# Patient Record
Sex: Female | Born: 1960 | Race: Black or African American | Hispanic: No | Marital: Married | Smoking: Never smoker
Health system: Southern US, Community
[De-identification: ages and names within clinical notes are randomized; demographics above are authoritative.]

## PROBLEM LIST (undated history)

## (undated) DIAGNOSIS — C50012 Malignant neoplasm of nipple and areola, left female breast: Secondary | ICD-10-CM

## (undated) DIAGNOSIS — Z923 Personal history of irradiation: Secondary | ICD-10-CM

## (undated) DIAGNOSIS — C50919 Malignant neoplasm of unspecified site of unspecified female breast: Secondary | ICD-10-CM

## (undated) DIAGNOSIS — Z9221 Personal history of antineoplastic chemotherapy: Secondary | ICD-10-CM

## (undated) HISTORY — DX: Malignant neoplasm of nipple and areola, left female breast: C50.012

---

## 1998-11-11 ENCOUNTER — Inpatient Hospital Stay (HOSPITAL_COMMUNITY): Admission: AD | Admit: 1998-11-11 | Discharge: 1998-11-11 | Payer: Self-pay | Admitting: Obstetrics & Gynecology

## 1998-12-12 ENCOUNTER — Inpatient Hospital Stay (HOSPITAL_COMMUNITY): Admission: AD | Admit: 1998-12-12 | Discharge: 1998-12-12 | Payer: Self-pay | Admitting: Obstetrics and Gynecology

## 1999-01-11 ENCOUNTER — Inpatient Hospital Stay (HOSPITAL_COMMUNITY): Admission: AD | Admit: 1999-01-11 | Discharge: 1999-01-11 | Payer: Self-pay | Admitting: Obstetrics & Gynecology

## 1999-02-10 ENCOUNTER — Inpatient Hospital Stay (HOSPITAL_COMMUNITY): Admission: AD | Admit: 1999-02-10 | Discharge: 1999-02-10 | Payer: Self-pay | Admitting: Obstetrics & Gynecology

## 1999-03-14 ENCOUNTER — Inpatient Hospital Stay (HOSPITAL_COMMUNITY): Admission: AD | Admit: 1999-03-14 | Discharge: 1999-03-14 | Payer: Self-pay | Admitting: Obstetrics & Gynecology

## 1999-03-17 ENCOUNTER — Inpatient Hospital Stay (HOSPITAL_COMMUNITY): Admission: AD | Admit: 1999-03-17 | Discharge: 1999-03-17 | Payer: Self-pay | Admitting: Obstetrics & Gynecology

## 1999-03-22 ENCOUNTER — Inpatient Hospital Stay (HOSPITAL_COMMUNITY): Admission: AD | Admit: 1999-03-22 | Discharge: 1999-03-22 | Payer: Self-pay | Admitting: Obstetrics & Gynecology

## 1999-03-24 ENCOUNTER — Ambulatory Visit (HOSPITAL_COMMUNITY): Admission: RE | Admit: 1999-03-24 | Discharge: 1999-03-24 | Payer: Self-pay | Admitting: Obstetrics and Gynecology

## 1999-04-13 ENCOUNTER — Ambulatory Visit (HOSPITAL_COMMUNITY): Admission: RE | Admit: 1999-04-13 | Discharge: 1999-04-13 | Payer: Self-pay | Admitting: Obstetrics and Gynecology

## 1999-04-13 ENCOUNTER — Encounter: Payer: Self-pay | Admitting: Obstetrics and Gynecology

## 1999-04-28 ENCOUNTER — Ambulatory Visit (HOSPITAL_COMMUNITY): Admission: RE | Admit: 1999-04-28 | Discharge: 1999-04-28 | Payer: Self-pay | Admitting: Obstetrics and Gynecology

## 1999-04-28 ENCOUNTER — Encounter: Payer: Self-pay | Admitting: Obstetrics and Gynecology

## 1999-05-18 ENCOUNTER — Ambulatory Visit (HOSPITAL_COMMUNITY): Admission: RE | Admit: 1999-05-18 | Discharge: 1999-05-18 | Payer: Self-pay | Admitting: Gastroenterology

## 1999-05-29 ENCOUNTER — Ambulatory Visit (HOSPITAL_COMMUNITY): Admission: RE | Admit: 1999-05-29 | Discharge: 1999-05-29 | Payer: Self-pay | Admitting: Gastroenterology

## 1999-05-29 ENCOUNTER — Encounter: Payer: Self-pay | Admitting: Gastroenterology

## 1999-06-07 ENCOUNTER — Inpatient Hospital Stay (HOSPITAL_COMMUNITY): Admission: AD | Admit: 1999-06-07 | Discharge: 1999-06-09 | Payer: Self-pay | Admitting: Obstetrics & Gynecology

## 1999-06-08 ENCOUNTER — Encounter: Payer: Self-pay | Admitting: Obstetrics & Gynecology

## 1999-08-02 ENCOUNTER — Inpatient Hospital Stay (HOSPITAL_COMMUNITY): Admission: RE | Admit: 1999-08-02 | Discharge: 1999-08-05 | Payer: Self-pay | Admitting: Obstetrics and Gynecology

## 2019-06-19 HISTORY — PX: BREAST LUMPECTOMY: SHX2

## 2019-07-03 ENCOUNTER — Other Ambulatory Visit: Payer: Self-pay | Admitting: Surgery

## 2019-07-03 ENCOUNTER — Other Ambulatory Visit: Payer: Self-pay | Admitting: Specialist

## 2019-07-03 DIAGNOSIS — R9389 Abnormal findings on diagnostic imaging of other specified body structures: Secondary | ICD-10-CM

## 2019-07-21 ENCOUNTER — Ambulatory Visit
Admission: RE | Admit: 2019-07-21 | Discharge: 2019-07-21 | Disposition: A | Discharge status: Home / Self Care | Payer: Managed Care, Other (non HMO) | Source: Ambulatory Visit | Attending: Surgery | Admitting: Surgery

## 2019-07-21 ENCOUNTER — Other Ambulatory Visit (HOSPITAL_COMMUNITY): Payer: Self-pay | Admitting: Diagnostic Radiology

## 2019-07-21 ENCOUNTER — Other Ambulatory Visit: Payer: Self-pay

## 2019-07-21 DIAGNOSIS — R9389 Abnormal findings on diagnostic imaging of other specified body structures: Secondary | ICD-10-CM

## 2019-07-21 MED ORDER — GADOBUTROL 1 MMOL/ML IV SOLN
9.0000 mL | Freq: Once | INTRAVENOUS | Status: AC | PRN
  Administered 2019-07-21: 9 mL via INTRAVENOUS

## 2019-08-26 DIAGNOSIS — C50012 Malignant neoplasm of nipple and areola, left female breast: Secondary | ICD-10-CM

## 2019-09-01 DIAGNOSIS — T451X1A Poisoning by antineoplastic and immunosuppressive drugs, accidental (unintentional), initial encounter: Secondary | ICD-10-CM | POA: Diagnosis not present

## 2019-09-28 DIAGNOSIS — C50012 Malignant neoplasm of nipple and areola, left female breast: Secondary | ICD-10-CM | POA: Diagnosis not present

## 2019-10-19 DIAGNOSIS — C50012 Malignant neoplasm of nipple and areola, left female breast: Secondary | ICD-10-CM | POA: Diagnosis not present

## 2019-11-30 DIAGNOSIS — C50012 Malignant neoplasm of nipple and areola, left female breast: Secondary | ICD-10-CM | POA: Diagnosis not present

## 2019-12-02 DIAGNOSIS — T451X1D Poisoning by antineoplastic and immunosuppressive drugs, accidental (unintentional), subsequent encounter: Secondary | ICD-10-CM

## 2019-12-02 DIAGNOSIS — C50012 Malignant neoplasm of nipple and areola, left female breast: Secondary | ICD-10-CM

## 2019-12-28 DIAGNOSIS — C50012 Malignant neoplasm of nipple and areola, left female breast: Secondary | ICD-10-CM

## 2020-01-18 DIAGNOSIS — C50012 Malignant neoplasm of nipple and areola, left female breast: Secondary | ICD-10-CM | POA: Diagnosis not present

## 2020-03-04 ENCOUNTER — Encounter: Payer: Self-pay | Admitting: Oncology

## 2020-03-04 DIAGNOSIS — C50012 Malignant neoplasm of nipple and areola, left female breast: Secondary | ICD-10-CM | POA: Insufficient documentation

## 2020-03-10 ENCOUNTER — Encounter: Payer: Self-pay | Admitting: Pharmacist

## 2020-03-15 DIAGNOSIS — T451X1D Poisoning by antineoplastic and immunosuppressive drugs, accidental (unintentional), subsequent encounter: Secondary | ICD-10-CM | POA: Diagnosis not present

## 2020-03-15 DIAGNOSIS — C50012 Malignant neoplasm of nipple and areola, left female breast: Secondary | ICD-10-CM | POA: Diagnosis not present

## 2020-03-18 DIAGNOSIS — C50012 Malignant neoplasm of nipple and areola, left female breast: Secondary | ICD-10-CM | POA: Diagnosis not present

## 2020-03-18 LAB — COMPREHENSIVE METABOLIC PANEL
Albumin: 4.3 (ref 3.5–5.0)
Calcium: 9.6 (ref 8.7–10.7)

## 2020-03-18 LAB — HEPATIC FUNCTION PANEL
ALT: 26 (ref 7–35)
AST: 31 (ref 13–35)
Alkaline Phosphatase: 49 (ref 25–125)
Bilirubin, Total: 0.3

## 2020-03-18 LAB — BASIC METABOLIC PANEL
BUN: 16 (ref 4–21)
Chloride: 107 (ref 99–108)
Creatinine: 1 (ref 0.5–1.1)
Glucose: 108
Potassium: 4.1 (ref 3.4–5.3)
Sodium: 140 (ref 137–147)

## 2020-03-18 LAB — CBC AND DIFFERENTIAL
HCT: 35 — AB (ref 36–46)
Hemoglobin: 11.3 — AB (ref 12.0–16.0)
Neutrophils Absolute: 1
Platelets: 182 (ref 150–399)
WBC: 2.7

## 2020-03-21 ENCOUNTER — Inpatient Hospital Stay: Payer: Managed Care, Other (non HMO) | Attending: Oncology

## 2020-03-21 ENCOUNTER — Telehealth: Payer: Self-pay | Admitting: Oncology

## 2020-03-21 ENCOUNTER — Other Ambulatory Visit: Payer: Self-pay | Admitting: Hematology and Oncology

## 2020-03-21 VITALS — BP 137/92 | HR 80 | Temp 98.8°F | Resp 18 | Ht 63.25 in | Wt 210.0 lb

## 2020-03-21 DIAGNOSIS — Z5112 Encounter for antineoplastic immunotherapy: Secondary | ICD-10-CM | POA: Diagnosis not present

## 2020-03-21 DIAGNOSIS — Z170 Estrogen receptor positive status [ER+]: Secondary | ICD-10-CM | POA: Diagnosis not present

## 2020-03-21 DIAGNOSIS — C50012 Malignant neoplasm of nipple and areola, left female breast: Secondary | ICD-10-CM | POA: Insufficient documentation

## 2020-03-21 MED ORDER — SODIUM CHLORIDE 0.9 % IV SOLN
Freq: Once | INTRAVENOUS | Status: AC
  Filled 2020-03-21: qty 250

## 2020-03-21 MED ORDER — SODIUM CHLORIDE 0.9 % IV SOLN
INTRAVENOUS | Status: DC
  Filled 2020-03-21 (×2): qty 250

## 2020-03-21 MED ORDER — HEPARIN SOD (PORK) LOCK FLUSH 100 UNIT/ML IV SOLN
500.0000 [IU] | Freq: Once | INTRAVENOUS | Status: AC | PRN
  Administered 2020-03-21: 500 [IU]
  Filled 2020-03-21: qty 5

## 2020-03-21 MED ORDER — DIPHENHYDRAMINE HCL 25 MG PO CAPS
ORAL_CAPSULE | ORAL | Status: AC
  Filled 2020-03-21: qty 1

## 2020-03-21 MED ORDER — DIPHENHYDRAMINE HCL 25 MG PO CAPS
25.0000 mg | ORAL_CAPSULE | Freq: Once | ORAL | Status: AC
  Administered 2020-03-21: 25 mg via ORAL

## 2020-03-21 MED ORDER — TRASTUZUMAB-DKST CHEMO 150 MG IV SOLR
5.5000 mg/kg | Freq: Once | INTRAVENOUS | Status: AC
  Administered 2020-03-21: 525 mg via INTRAVENOUS
  Filled 2020-03-21: qty 25

## 2020-03-21 MED ORDER — ACETAMINOPHEN 325 MG PO TABS
650.0000 mg | ORAL_TABLET | Freq: Once | ORAL | Status: AC
  Administered 2020-03-21: 650 mg via ORAL

## 2020-03-21 MED ORDER — ACETAMINOPHEN 325 MG PO TABS
ORAL_TABLET | ORAL | Status: AC
  Filled 2020-03-21: qty 2

## 2020-03-21 NOTE — Progress Notes (Signed)
Ok to treat at [facility] 1.24

## 2020-03-25 ENCOUNTER — Telehealth: Payer: Self-pay | Admitting: Oncology

## 2020-03-25 NOTE — Telephone Encounter (Signed)
Called pt to confirm patients appts for 10/26 and 11/16 - pt is aware of both appts on schedule.

## 2020-04-12 ENCOUNTER — Other Ambulatory Visit: Payer: Self-pay

## 2020-04-12 ENCOUNTER — Inpatient Hospital Stay: Payer: Managed Care, Other (non HMO)

## 2020-04-12 VITALS — BP 120/90 | HR 76 | Temp 98.5°F | Resp 18 | Ht 63.25 in | Wt 211.2 lb

## 2020-04-12 DIAGNOSIS — C50012 Malignant neoplasm of nipple and areola, left female breast: Secondary | ICD-10-CM

## 2020-04-12 DIAGNOSIS — Z5112 Encounter for antineoplastic immunotherapy: Secondary | ICD-10-CM | POA: Diagnosis not present

## 2020-04-12 MED ORDER — HEPARIN SOD (PORK) LOCK FLUSH 100 UNIT/ML IV SOLN
500.0000 [IU] | Freq: Once | INTRAVENOUS | Status: AC | PRN
  Administered 2020-04-12: 500 [IU]
  Filled 2020-04-12: qty 5

## 2020-04-12 MED ORDER — ACETAMINOPHEN 325 MG PO TABS
650.0000 mg | ORAL_TABLET | Freq: Once | ORAL | Status: AC
  Administered 2020-04-12: 650 mg via ORAL

## 2020-04-12 MED ORDER — DIPHENHYDRAMINE HCL 50 MG/ML IJ SOLN
INTRAMUSCULAR | Status: AC
  Filled 2020-04-12: qty 1

## 2020-04-12 MED ORDER — ACETAMINOPHEN 325 MG PO TABS
ORAL_TABLET | ORAL | Status: AC
  Filled 2020-04-12: qty 2

## 2020-04-12 MED ORDER — SODIUM CHLORIDE 0.9% FLUSH
10.0000 mL | INTRAVENOUS | Status: DC | PRN
  Administered 2020-04-12: 10 mL
  Filled 2020-04-12: qty 10

## 2020-04-12 MED ORDER — SODIUM CHLORIDE 0.9 % IV SOLN
Freq: Once | INTRAVENOUS | Status: AC
  Filled 2020-04-12: qty 250

## 2020-04-12 MED ORDER — TRASTUZUMAB-DKST CHEMO 150 MG IV SOLR
5.5000 mg/kg | Freq: Once | INTRAVENOUS | Status: AC
  Administered 2020-04-12: 525 mg via INTRAVENOUS
  Filled 2020-04-12: qty 25

## 2020-04-12 MED ORDER — DIPHENHYDRAMINE HCL 50 MG/ML IJ SOLN
25.0000 mg | Freq: Once | INTRAMUSCULAR | Status: AC
  Administered 2020-04-12: 25 mg via INTRAVENOUS

## 2020-04-12 NOTE — Patient Instructions (Signed)
[place] Discharge Instructions for Patients Receiving Chemotherapy  Today you received the following chemotherapy agents herceptin  To help prevent nausea and vomiting after your treatment, we encourage you to take your nausea medication.   If you develop nausea and vomiting that is not controlled by your nausea medication, call the clinic.   BELOW ARE SYMPTOMS THAT SHOULD BE REPORTED IMMEDIATELY:  *FEVER GREATER THAN 100.5 F  *CHILLS WITH OR WITHOUT FEVER  NAUSEA AND VOMITING THAT IS NOT CONTROLLED WITH YOUR NAUSEA MEDICATION  *UNUSUAL SHORTNESS OF BREATH  *UNUSUAL BRUISING OR BLEEDING  TENDERNESS IN MOUTH AND THROAT WITH OR WITHOUT PRESENCE OF ULCERS  *URINARY PROBLEMS  *BOWEL PROBLEMS  UNUSUAL RASH Items with * indicate a potential emergency and should be followed up as soon as possible.  Feel free to call the clinic should you have any questions or concerns at The clinic phone number is [phone].  Please show the [place] at check-in to the Emergency Department and triage nurse.

## 2020-04-12 NOTE — Progress Notes (Signed)
PT STABLE AT TIME OF DISCHARGE 

## 2020-04-22 ENCOUNTER — Other Ambulatory Visit: Payer: Self-pay

## 2020-04-22 DIAGNOSIS — C50012 Malignant neoplasm of nipple and areola, left female breast: Secondary | ICD-10-CM

## 2020-04-22 MED ORDER — HYDROCODONE-ACETAMINOPHEN 7.5-325 MG PO TABS: 1.0000 | ORAL_TABLET | Freq: Four times a day (QID) | ORAL | 0 refills | Status: DC | PRN

## 2020-04-25 ENCOUNTER — Other Ambulatory Visit: Payer: Self-pay | Admitting: Oncology

## 2020-04-25 DIAGNOSIS — C50012 Malignant neoplasm of nipple and areola, left female breast: Secondary | ICD-10-CM

## 2020-04-25 MED ORDER — HYDROCODONE-ACETAMINOPHEN 7.5-325 MG PO TABS: 1.0000 | ORAL_TABLET | Freq: Four times a day (QID) | ORAL | 0 refills | Status: DC | PRN

## 2020-04-26 ENCOUNTER — Other Ambulatory Visit: Payer: Self-pay

## 2020-04-26 DIAGNOSIS — C50012 Malignant neoplasm of nipple and areola, left female breast: Secondary | ICD-10-CM

## 2020-04-26 MED ORDER — HYDROCODONE-ACETAMINOPHEN 7.5-325 MG PO TABS: 1.0000 | ORAL_TABLET | Freq: Four times a day (QID) | ORAL | 0 refills | Status: DC | PRN

## 2020-04-27 ENCOUNTER — Other Ambulatory Visit: Payer: Self-pay | Admitting: Hematology and Oncology

## 2020-04-27 ENCOUNTER — Telehealth: Payer: Self-pay

## 2020-04-27 DIAGNOSIS — C50012 Malignant neoplasm of nipple and areola, left female breast: Secondary | ICD-10-CM

## 2020-04-27 MED ORDER — HYDROCODONE-ACETAMINOPHEN 7.5-325 MG PO TABS: 1.0000 | ORAL_TABLET | Freq: Four times a day (QID) | ORAL | 0 refills | Status: DC | PRN

## 2020-04-27 NOTE — Telephone Encounter (Signed)
Pt notified that Dr Bobby Rumpf sent Rx @ 1155-awc

## 2020-04-27 NOTE — Telephone Encounter (Addendum)
Pt called this am (LVM on triage line), frustrated that her pain med is still not at pharmacy. I called pt back, apologized for the delay (prescriber's order transmission from 11/5, 04/25/20, & 04/26/20) isn't going through to pharmacy via [place]). I told her I would call her back asap once we get script sent. Pt appreciative of return call. [phone]  I notified Dr Bobby Rumpf @1028 , he will send eRx soon.

## 2020-04-29 NOTE — Progress Notes (Signed)
ECHO COMPLETED 03/15/20  EF=55 to 60% NEXT ECHO DUE 06/24/20

## 2020-05-02 ENCOUNTER — Other Ambulatory Visit: Payer: Self-pay

## 2020-05-02 ENCOUNTER — Inpatient Hospital Stay: Payer: Managed Care, Other (non HMO) | Attending: Oncology

## 2020-05-02 VITALS — BP 127/67 | HR 76 | Temp 98.6°F | Resp 18 | Ht 63.25 in | Wt 212.2 lb

## 2020-05-02 DIAGNOSIS — C50012 Malignant neoplasm of nipple and areola, left female breast: Secondary | ICD-10-CM | POA: Diagnosis present

## 2020-05-02 DIAGNOSIS — Z5112 Encounter for antineoplastic immunotherapy: Secondary | ICD-10-CM | POA: Diagnosis present

## 2020-05-02 DIAGNOSIS — Z79899 Other long term (current) drug therapy: Secondary | ICD-10-CM | POA: Insufficient documentation

## 2020-05-02 MED ORDER — DIPHENHYDRAMINE HCL 50 MG/ML IJ SOLN
INTRAMUSCULAR | Status: AC
  Filled 2020-05-02: qty 1

## 2020-05-02 MED ORDER — SODIUM CHLORIDE 0.9 % IV SOLN
Freq: Once | INTRAVENOUS | Status: AC
  Filled 2020-05-02: qty 250

## 2020-05-02 MED ORDER — HEPARIN SOD (PORK) LOCK FLUSH 100 UNIT/ML IV SOLN
500.0000 [IU] | Freq: Once | INTRAVENOUS | Status: AC | PRN
  Administered 2020-05-02: 500 [IU]
  Filled 2020-05-02: qty 5

## 2020-05-02 MED ORDER — ACETAMINOPHEN 325 MG PO TABS
ORAL_TABLET | ORAL | Status: AC
  Filled 2020-05-02: qty 2

## 2020-05-02 MED ORDER — ACETAMINOPHEN 325 MG PO TABS
650.0000 mg | ORAL_TABLET | Freq: Once | ORAL | Status: AC
  Administered 2020-05-02: 650 mg via ORAL

## 2020-05-02 MED ORDER — DIPHENHYDRAMINE HCL 50 MG/ML IJ SOLN
25.0000 mg | Freq: Once | INTRAMUSCULAR | Status: AC
  Administered 2020-05-02: 25 mg via INTRAVENOUS

## 2020-05-02 MED ORDER — TRASTUZUMAB-DKST CHEMO 150 MG IV SOLR
5.5000 mg/kg | Freq: Once | INTRAVENOUS | Status: AC
  Administered 2020-05-02: 525 mg via INTRAVENOUS
  Filled 2020-05-02: qty 25

## 2020-05-02 NOTE — Patient Instructions (Signed)
[place] Discharge Instructions for Patients Receiving Chemotherapy  Today you received the following chemotherapy agents OGIVRI  To help prevent nausea and vomiting after your treatment, we encourage you to take your nausea medication.   If you develop nausea and vomiting that is not controlled by your nausea medication, call the clinic.   BELOW ARE SYMPTOMS THAT SHOULD BE REPORTED IMMEDIATELY:  *FEVER GREATER THAN 100.5 F  *CHILLS WITH OR WITHOUT FEVER  NAUSEA AND VOMITING THAT IS NOT CONTROLLED WITH YOUR NAUSEA MEDICATION  *UNUSUAL SHORTNESS OF BREATH  *UNUSUAL BRUISING OR BLEEDING  TENDERNESS IN MOUTH AND THROAT WITH OR WITHOUT PRESENCE OF ULCERS  *URINARY PROBLEMS  *BOWEL PROBLEMS  UNUSUAL RASH Items with * indicate a potential emergency and should be followed up as soon as possible.  Feel free to call the clinic should you have any questions or concerns at The clinic phone number is [phone].  Please show the [place] at check-in to the Emergency Department and triage nurse.

## 2020-05-02 NOTE — Progress Notes (Signed)
1100:PT STABLE AT TIME OF DISCHARGE ?

## 2020-05-03 ENCOUNTER — Ambulatory Visit: Payer: Self-pay

## 2020-05-05 ENCOUNTER — Other Ambulatory Visit: Payer: Self-pay

## 2020-05-05 DIAGNOSIS — C50012 Malignant neoplasm of nipple and areola, left female breast: Secondary | ICD-10-CM

## 2020-05-05 DIAGNOSIS — T451X5A Adverse effect of antineoplastic and immunosuppressive drugs, initial encounter: Secondary | ICD-10-CM

## 2020-05-05 MED ORDER — PREGABALIN 75 MG PO CAPS: 75.0000 mg | ORAL_CAPSULE | Freq: Two times a day (BID) | ORAL | 0 refills | Status: DC

## 2020-05-05 NOTE — Telephone Encounter (Signed)
Pt.notified

## 2020-05-05 NOTE — Telephone Encounter (Signed)
Prescription sent

## 2020-05-20 ENCOUNTER — Other Ambulatory Visit: Payer: Self-pay | Admitting: Pharmacist

## 2020-05-20 NOTE — Progress Notes (Signed)
Last ECHO 03/15/20 EF=55 to 60% NEXT ECHO DUE 06/14/20

## 2020-05-23 ENCOUNTER — Inpatient Hospital Stay: Payer: Managed Care, Other (non HMO) | Attending: Oncology

## 2020-05-23 ENCOUNTER — Other Ambulatory Visit: Payer: Self-pay

## 2020-05-23 VITALS — BP 120/57 | HR 77 | Temp 98.4°F | Resp 18 | Ht 63.25 in | Wt 207.0 lb

## 2020-05-23 DIAGNOSIS — Z79899 Other long term (current) drug therapy: Secondary | ICD-10-CM | POA: Insufficient documentation

## 2020-05-23 DIAGNOSIS — C50012 Malignant neoplasm of nipple and areola, left female breast: Secondary | ICD-10-CM | POA: Insufficient documentation

## 2020-05-23 DIAGNOSIS — Z5112 Encounter for antineoplastic immunotherapy: Secondary | ICD-10-CM | POA: Diagnosis not present

## 2020-05-23 MED ORDER — DIPHENHYDRAMINE HCL 50 MG/ML IJ SOLN
25.0000 mg | Freq: Once | INTRAMUSCULAR | Status: AC
  Administered 2020-05-23: 25 mg via INTRAVENOUS

## 2020-05-23 MED ORDER — SODIUM CHLORIDE 0.9 % IV SOLN
Freq: Once | INTRAVENOUS | Status: AC
  Filled 2020-05-23: qty 250

## 2020-05-23 MED ORDER — ACETAMINOPHEN 325 MG PO TABS
ORAL_TABLET | ORAL | Status: AC
  Filled 2020-05-23: qty 2

## 2020-05-23 MED ORDER — DIPHENHYDRAMINE HCL 50 MG/ML IJ SOLN
INTRAMUSCULAR | Status: AC
  Filled 2020-05-23: qty 1

## 2020-05-23 MED ORDER — HEPARIN SOD (PORK) LOCK FLUSH 100 UNIT/ML IV SOLN
500.0000 [IU] | Freq: Once | INTRAVENOUS | Status: AC | PRN
  Administered 2020-05-23: 500 [IU]
  Filled 2020-05-23: qty 5

## 2020-05-23 MED ORDER — ACETAMINOPHEN 325 MG PO TABS
650.0000 mg | ORAL_TABLET | Freq: Once | ORAL | Status: AC
  Administered 2020-05-23: 650 mg via ORAL

## 2020-05-23 MED ORDER — TRASTUZUMAB-DKST CHEMO 150 MG IV SOLR
6.0000 mg/kg | Freq: Once | INTRAVENOUS | Status: AC
  Administered 2020-05-23: 567 mg via INTRAVENOUS
  Filled 2020-05-23: qty 27

## 2020-05-23 NOTE — Progress Notes (Signed)
Pt stable at time of discharge. 

## 2020-05-23 NOTE — Patient Instructions (Signed)
[place] Discharge Instructions for Patients Receiving Chemotherapy  Today you received the following chemotherapy agents Trastuzumab  To help prevent nausea and vomiting after your treatment, we encourage you to take your nausea medication.   If you develop nausea and vomiting that is not controlled by your nausea medication, call the clinic.   BELOW ARE SYMPTOMS THAT SHOULD BE REPORTED IMMEDIATELY:  *FEVER GREATER THAN 100.5 F  *CHILLS WITH OR WITHOUT FEVER  NAUSEA AND VOMITING THAT IS NOT CONTROLLED WITH YOUR NAUSEA MEDICATION  *UNUSUAL SHORTNESS OF BREATH  *UNUSUAL BRUISING OR BLEEDING  TENDERNESS IN MOUTH AND THROAT WITH OR WITHOUT PRESENCE OF ULCERS  *URINARY PROBLEMS  *BOWEL PROBLEMS  UNUSUAL RASH Items with * indicate a potential emergency and should be followed up as soon as possible.  Feel free to call the clinic should you have any questions or concerns at The clinic phone number is [phone].  Please show the [place] at check-in to the Emergency Department and triage nurse.

## 2020-05-24 ENCOUNTER — Ambulatory Visit: Payer: Self-pay

## 2020-05-30 ENCOUNTER — Other Ambulatory Visit: Payer: Self-pay | Admitting: Surgery

## 2020-05-30 ENCOUNTER — Other Ambulatory Visit: Payer: Self-pay | Admitting: Specialist

## 2020-05-30 DIAGNOSIS — Z853 Personal history of malignant neoplasm of breast: Secondary | ICD-10-CM

## 2020-06-13 ENCOUNTER — Inpatient Hospital Stay: Payer: Managed Care, Other (non HMO)

## 2020-06-13 ENCOUNTER — Other Ambulatory Visit: Payer: Self-pay

## 2020-06-13 VITALS — BP 118/70 | HR 76 | Temp 98.2°F | Resp 18 | Ht 63.25 in | Wt 206.2 lb

## 2020-06-13 DIAGNOSIS — Z5112 Encounter for antineoplastic immunotherapy: Secondary | ICD-10-CM | POA: Diagnosis not present

## 2020-06-13 DIAGNOSIS — C50012 Malignant neoplasm of nipple and areola, left female breast: Secondary | ICD-10-CM

## 2020-06-13 MED ORDER — TRASTUZUMAB-DKST CHEMO 150 MG IV SOLR
6.0000 mg/kg | Freq: Once | INTRAVENOUS | Status: AC
  Administered 2020-06-13: 567 mg via INTRAVENOUS
  Filled 2020-06-13: qty 27

## 2020-06-13 MED ORDER — ACETAMINOPHEN 325 MG PO TABS
650.0000 mg | ORAL_TABLET | Freq: Once | ORAL | Status: AC
  Administered 2020-06-13: 650 mg via ORAL

## 2020-06-13 MED ORDER — ONDANSETRON HCL 4 MG/2ML IJ SOLN
8.0000 mg | Freq: Once | INTRAMUSCULAR | Status: AC
  Administered 2020-06-13: 8 mg via INTRAVENOUS

## 2020-06-13 MED ORDER — DIPHENHYDRAMINE HCL 50 MG/ML IJ SOLN
INTRAMUSCULAR | Status: AC
  Filled 2020-06-13: qty 1

## 2020-06-13 MED ORDER — DIPHENHYDRAMINE HCL 50 MG/ML IJ SOLN
25.0000 mg | Freq: Once | INTRAMUSCULAR | Status: AC
Start: 2020-06-13 — End: 2020-06-13
  Administered 2020-06-13: 25 mg via INTRAVENOUS

## 2020-06-13 MED ORDER — HEPARIN SOD (PORK) LOCK FLUSH 100 UNIT/ML IV SOLN
500.0000 [IU] | Freq: Once | INTRAVENOUS | Status: AC | PRN
  Administered 2020-06-13: 500 [IU]
  Filled 2020-06-13: qty 5

## 2020-06-13 MED ORDER — SODIUM CHLORIDE 0.9 % IV SOLN
Freq: Once | INTRAVENOUS | Status: AC
  Filled 2020-06-13: qty 250

## 2020-06-13 MED ORDER — ONDANSETRON HCL 4 MG/2ML IJ SOLN
INTRAMUSCULAR | Status: AC
  Filled 2020-06-13: qty 4

## 2020-06-13 MED ORDER — ACETAMINOPHEN 325 MG PO TABS
ORAL_TABLET | ORAL | Status: AC
  Filled 2020-06-13: qty 2

## 2020-06-13 NOTE — Patient Instructions (Signed)
[facility] Discharge Instructions for Patients Receiving Chemotherapy  Today you received the following chemotherapy agents Trastuzumab  To help prevent nausea and vomiting after your treatment, we encourage you to take your nausea medication.   If you develop nausea and vomiting that is not controlled by your nausea medication, call the clinic.   BELOW ARE SYMPTOMS THAT SHOULD BE REPORTED IMMEDIATELY:  *FEVER GREATER THAN 100.5 F  *CHILLS WITH OR WITHOUT FEVER  NAUSEA AND VOMITING THAT IS NOT CONTROLLED WITH YOUR NAUSEA MEDICATION  *UNUSUAL SHORTNESS OF BREATH  *UNUSUAL BRUISING OR BLEEDING  TENDERNESS IN MOUTH AND THROAT WITH OR WITHOUT PRESENCE OF ULCERS  *URINARY PROBLEMS  *BOWEL PROBLEMS  UNUSUAL RASH Items with * indicate a potential emergency and should be followed up as soon as possible.  Feel free to call the clinic should you have any questions or concerns at The clinic phone number is [phone].  Please show the CHEMO ALERT CARD at check-in to the Emergency Department and triage nurse.   

## 2020-06-14 ENCOUNTER — Ambulatory Visit: Payer: Self-pay

## 2020-06-22 ENCOUNTER — Other Ambulatory Visit: Payer: Self-pay

## 2020-06-22 DIAGNOSIS — F32A Depression, unspecified: Secondary | ICD-10-CM

## 2020-06-22 MED ORDER — ESCITALOPRAM OXALATE 20 MG PO TABS: 20.0000 mg | ORAL_TABLET | Freq: Every day | ORAL | 1 refills | Status: DC

## 2020-06-22 NOTE — Telephone Encounter (Signed)
Sent!

## 2020-06-27 ENCOUNTER — Telehealth: Payer: Self-pay | Admitting: Oncology

## 2020-06-28 ENCOUNTER — Other Ambulatory Visit: Payer: Self-pay | Admitting: Pharmacist

## 2020-07-01 ENCOUNTER — Ambulatory Visit: Payer: Managed Care, Other (non HMO) | Admitting: Oncology

## 2020-07-01 ENCOUNTER — Other Ambulatory Visit: Payer: Self-pay | Admitting: Hematology and Oncology

## 2020-07-01 DIAGNOSIS — C50012 Malignant neoplasm of nipple and areola, left female breast: Secondary | ICD-10-CM

## 2020-07-04 ENCOUNTER — Ambulatory Visit: Payer: Managed Care, Other (non HMO) | Admitting: Hematology and Oncology

## 2020-07-04 ENCOUNTER — Inpatient Hospital Stay: Payer: Managed Care, Other (non HMO) | Admitting: Oncology

## 2020-07-04 ENCOUNTER — Telehealth: Payer: Self-pay | Admitting: Hematology and Oncology

## 2020-07-04 NOTE — Telephone Encounter (Signed)
1/17 spoke with patient and rs appt -due to weather

## 2020-07-04 NOTE — Progress Notes (Signed)
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  She was placed on weekly paclitaxel/Herceptin for her adjuvant therapy.  However, due to significant peripheral neuropathy, her paclitaxel was discontinued a few weeks before the entire 12 weekly treatments were completed.  She is currently on maintenance Herceptin, which is being given once every 3 weeks. She also takes anastrazole on a daily basis for her adjuvant endocrine therapy.  She completed adjuvant radiation to the left breast in October.  She still had decreased range of motion of her left arm, so underwent physical therapy.  INTERVAL HISTORY:  Ruth White is here today for routine follow up.  Since her last visit, the patient has been doing fairly well, although, she reports continued fatigue. She asks what she can do for the fatigue.  She continues trastuzumab every 3 weeks and denies having any other significant problems with this.  She reports tenderness and swelling of the lateral left breast. She denies fevers or chills.  She has chronic constipation, for which she uses senna and MiraLax.  She had a bowel movement today. She continues to have decreased range of motion of the left shoulder and requests a referral back for further physical therapy. Her appetite is good. Her weight has decreased 3 pounds over last 3 weeks.  She is overdue for bilateral diagnostic mammogram, which is now scheduled for February.  She will be due for an echocardiogram prior to her next treatment.  REVIEW OF SYSTEMS:  Review of  Systems  Constitutional: Positive for fatigue. Negative for appetite change, chills, fever and unexpected weight change.  HENT:   Negative for lump/mass, mouth sores and sore throat.   Respiratory: Negative for cough and shortness of breath.   Cardiovascular: Negative for chest pain and leg swelling.  Gastrointestinal: Positive for constipation (chronic). Negative for abdominal pain, diarrhea, nausea and vomiting.  Genitourinary: Negative for difficulty urinating, dysuria, frequency and hematuria.   Musculoskeletal: Positive for back pain (chronic). Negative for arthralgias and myalgias.  Skin: Negative for rash.  Neurological: Negative for dizziness and headaches.  Psychiatric/Behavioral: Negative for depression and sleep disturbance. The patient is not nervous/anxious.      VITALS:  Blood pressure 126/73, pulse 96, temperature 98.1 F (36.7 C), temperature source Oral, resp. rate 18, height '5\' 3"'  (1.6 m), weight 203 lb 11.2 oz (92.4 kg), SpO2 93 %.  Wt Readings from Last 3 Encounters:  07/05/20 203 lb 11.2 oz (92.4 kg)  06/13/20 206 lb 4 oz (93.6 kg)  05/23/20 207 lb (93.9 kg)    Body mass index is 36.08 kg/m.  Performance status (ECOG): 1 - Symptomatic but completely ambulatory  PHYSICAL EXAM:  Physical Exam Vitals and nursing note reviewed.  Constitutional:      General: She is not in acute distress.    Appearance: Normal appearance.  HENT:     Mouth/Throat:     Mouth: Mucous membranes are moist.     Pharynx: Oropharynx is clear. No oropharyngeal exudate or posterior oropharyngeal erythema.  Eyes:     General: No scleral icterus.  Extraocular Movements: Extraocular movements intact.     Conjunctiva/sclera: Conjunctivae normal.     Pupils: Pupils are equal, round, and reactive to light.  Cardiovascular:     Rate and Rhythm: Normal rate and regular rhythm.     Heart sounds: Normal heart sounds. No murmur heard. No friction rub. No gallop.   Pulmonary:     Effort: No  respiratory distress.     Breath sounds: Normal breath sounds. No stridor. No wheezing, rhonchi or rales.  Chest:  Breasts:     Right: Normal. No swelling, bleeding, inverted nipple, mass, nipple discharge, skin change, tenderness, axillary adenopathy or supraclavicular adenopathy.     Left: Swelling, skin change and tenderness present. No bleeding, inverted nipple, mass, nipple discharge, axillary adenopathy or supraclavicular adenopathy.      Comments: There is mild swelling, tenderness and erythema of the lateral left breast. Abdominal:     General: There is no distension.     Palpations: Abdomen is soft. There is no hepatomegaly, splenomegaly or mass.     Tenderness: There is no abdominal tenderness. There is no guarding.     Hernia: No hernia is present.  Musculoskeletal:     Left shoulder: Tenderness present. No swelling, deformity or effusion. Decreased range of motion. Decreased strength.     Cervical back: Normal range of motion and neck supple. No tenderness.     Right lower leg: No edema.     Left lower leg: No edema.  Lymphadenopathy:     Cervical: No cervical adenopathy.     Upper Body:     Right upper body: No supraclavicular or axillary adenopathy.     Left upper body: No supraclavicular or axillary adenopathy.     Lower Body: No right inguinal adenopathy. No left inguinal adenopathy.  Skin:    General: Skin is warm and dry.     Coloration: Skin is not jaundiced.     Findings: No rash.  Neurological:     Mental Status: She is alert and oriented to person, place, and time.     Cranial Nerves: No cranial nerve deficit.  Psychiatric:        Mood and Affect: Mood normal.        Behavior: Behavior normal.        Thought Content: Thought content normal.    LABS:   CBC Latest Ref Rng & Units 07/05/2020 03/18/2020  WBC - 3.6 2.7  Hemoglobin 12.0 - 16.0 11.6(A) 11.3(A)  Hematocrit 36 - 46 35(A) 35(A)  Platelets 150 - 399 186 182   CMP Latest Ref Rng & Units 07/05/2020  03/18/2020  BUN 4 - 21 26(A) 16  Creatinine 0.5 - 1.1 1.5(A) 1.0  Sodium 137 - 147 139 140  Potassium 3.4 - 5.3 4.2 4.1  Chloride 99 - 108 105 107  CO2 13 - 22 25(A) -  Calcium 8.7 - 10.7 9.7 9.6  Alkaline Phos 25 - 125 57 49  AST 13 - 35 37(A) 31  ALT 7 - 35 27 26     No results found for: CEA1 / No results found for: CEA1 No results found for: PSA1 No results found for: FXT024 No results found for: OXB353  No results found for: TOTALPROTELP, ALBUMINELP, A1GS, A2GS, BETS, BETA2SER, GAMS, MSPIKE, SPEI No results found for: TIBC, FERRITIN, IRONPCTSAT No results found for: LDH  STUDIES:  No results found.    HISTORY:   Past Medical History:  Diagnosis Date  . 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 No pertinent surgical history.  History reviewed. No pertinent family history.  Social History:  reports that she has never smoked. She has never used smokeless tobacco. No history on file for alcohol use and drug use.The patient is accompanied by her husband today.  Allergies: No Known Allergies  Current Medications: Current Outpatient Medications  Medication Sig Dispense Refill  . cephALEXin (KEFLEX) 500 MG capsule Take 1 capsule (500 mg total) by mouth 4 (four) times daily. 40 capsule 0  . anastrozole (ARIMIDEX) 1 MG tablet Take 1 mg by mouth daily.    Marland Kitchen aspirin 325 MG EC tablet Take 325 mg by mouth daily.    Marland Kitchen atenolol (TENORMIN) 50 MG tablet Take 50 mg by mouth daily.    . chlorhexidine (PERIDEX) 0.12 % solution     . Cholecalciferol 50 MCG (2000 UT) TABS Take by mouth.    . [employer] PURELAX 17 GM/SCOOP powder Take by mouth.    . dexamethasone (DECADRON) 4 MG tablet Take 4 mg by mouth 2 (two) times daily with a meal.    . diclofenac Sodium (VOLTAREN) 1 % GEL Apply topically.    Marland Kitchen escitalopram (LEXAPRO) 20 MG tablet Take 1 tablet (20 mg total) by mouth daily.  60 tablet 1  . ezetimibe (ZETIA) 10 MG tablet Take 10 mg by mouth daily.    . hydrochlorothiazide (HYDRODIURIL) 12.5 MG tablet Take 12.5 mg by mouth daily.    Marland Kitchen HYDROcodone-acetaminophen (NORCO) 7.5-325 MG tablet Take 1 tablet by mouth every 6 (six) hours as needed for moderate pain. 50 tablet 0  . ibuprofen (ADVIL) 800 MG tablet Take 800 mg by mouth every 8 (eight) hours as needed.    . lidocaine-prilocaine (EMLA) cream Apply 1 application topically as needed.    . liraglutide (VICTOZA) 18 MG/3ML SOPN Inject into the skin.    Marland Kitchen lisinopril (ZESTRIL) 40 MG tablet Take 40 mg by mouth daily.    . magic mouthwash SOLN Take 5 mLs by mouth.    . magnesium citrate SOLN Take 1 Bottle by mouth once.    . metFORMIN (GLUCOPHAGE) 500 MG tablet Take by mouth 2 (two) times daily with a meal.    . ondansetron (ZOFRAN) 4 MG tablet Take 4 mg by mouth every 8 (eight) hours as needed for nausea or vomiting.    . ondansetron (ZOFRAN-ODT) 4 MG disintegrating tablet Take 4 mg by mouth every 8 (eight) hours as needed.    . polyethylene glycol (MIRALAX / GLYCOLAX) 17 g packet Take 17 g by mouth daily.    . pregabalin (LYRICA) 75 MG capsule Take 1 capsule (75 mg total) by mouth 2 (two) times daily. 180 capsule 0  . prochlorperazine (COMPAZINE) 10 MG tablet Take 10 mg by mouth every 6 (six) hours as needed for nausea or vomiting.    . rosuvastatin (CRESTOR) 40 MG tablet Take 40 mg by mouth daily.    Marland Kitchen senna-docusate (SENOKOT-S) 8.6-50 MG tablet Take 1 tablet by mouth daily.    . traMADol (ULTRAM) 50 MG tablet Take by mouth every 6 (six) hours as needed.    . vitamin C (ASCORBIC ACID) 250 MG tablet Take 500 mg by mouth daily.     No current facility-administered medications for this visit.     ASSESSMENT & PLAN:   Assessment/Plan: 1. Stage IA multifocal hormone/her 2 Neu  receptor positive breast cancer.  The patient is currently receiving maintenance Herceptin once every 3 weeks to complete 1 full year of anti-her2  therapy.  She knows to continue taking her anastrozole daily for 5 total years of adjuvant endocrine therapy.   2. Mild anemia, which is now microcytic. I will evaluate this with iron studies.    3. Probable mastitis of the left breast. I will place her on cephalexin. If her symptoms do not resolve she knows to contact us. 4. Fatigue, which is likely multifactorial.  I advised her that mild exercise can help fatigue related to cancer and cancer treatment.  She knows to her breast when needed. 5. Persistent decreased range of motion and pain of the left shoulder.  I will refer her for physical therapy again at this time.  We will see her back in March for repeat clinical assessment as she completes her HER2 targeted therapy.  The patient and her husband understand all the plans discussed today and are in agreement with them.  They knows to contact our office if she develops concerns prior to her next appointment.   Marvia Pickles, PA-C

## 2020-07-05 ENCOUNTER — Inpatient Hospital Stay: Payer: Managed Care, Other (non HMO) | Admitting: Hematology and Oncology

## 2020-07-05 ENCOUNTER — Inpatient Hospital Stay (INDEPENDENT_AMBULATORY_CARE_PROVIDER_SITE_OTHER): Payer: Managed Care, Other (non HMO) | Admitting: Hematology and Oncology

## 2020-07-05 ENCOUNTER — Encounter: Payer: Self-pay | Admitting: Hematology and Oncology

## 2020-07-05 ENCOUNTER — Inpatient Hospital Stay: Payer: Managed Care, Other (non HMO) | Attending: Oncology

## 2020-07-05 ENCOUNTER — Other Ambulatory Visit: Payer: Self-pay

## 2020-07-05 ENCOUNTER — Inpatient Hospital Stay: Payer: Managed Care, Other (non HMO)

## 2020-07-05 ENCOUNTER — Telehealth: Payer: Self-pay | Admitting: Hematology and Oncology

## 2020-07-05 VITALS — BP 126/73 | HR 96 | Temp 98.1°F | Resp 18 | Ht 63.0 in | Wt 203.7 lb

## 2020-07-05 DIAGNOSIS — C50012 Malignant neoplasm of nipple and areola, left female breast: Secondary | ICD-10-CM

## 2020-07-05 DIAGNOSIS — D509 Iron deficiency anemia, unspecified: Secondary | ICD-10-CM

## 2020-07-05 DIAGNOSIS — Z5112 Encounter for antineoplastic immunotherapy: Secondary | ICD-10-CM | POA: Insufficient documentation

## 2020-07-05 DIAGNOSIS — Z170 Estrogen receptor positive status [ER+]: Secondary | ICD-10-CM | POA: Insufficient documentation

## 2020-07-05 DIAGNOSIS — Z79899 Other long term (current) drug therapy: Secondary | ICD-10-CM | POA: Insufficient documentation

## 2020-07-05 LAB — BASIC METABOLIC PANEL
BUN: 26 — AB (ref 4–21)
CO2: 25 — AB (ref 13–22)
Chloride: 105 (ref 99–108)
Creatinine: 1.5 — AB (ref 0.5–1.1)
Glucose: 113
Potassium: 4.2 (ref 3.4–5.3)
Sodium: 139 (ref 137–147)

## 2020-07-05 LAB — HEPATIC FUNCTION PANEL
ALT: 27 (ref 7–35)
AST: 37 — AB (ref 13–35)
Alkaline Phosphatase: 57 (ref 25–125)
Bilirubin, Total: 0.3

## 2020-07-05 LAB — COMPREHENSIVE METABOLIC PANEL
Albumin: 4.5 (ref 3.5–5.0)
Calcium: 9.7 (ref 8.7–10.7)

## 2020-07-05 LAB — CBC AND DIFFERENTIAL
HCT: 35 — AB (ref 36–46)
Hemoglobin: 11.6 — AB (ref 12.0–16.0)
Neutrophils Absolute: 2.02
Platelets: 186 (ref 150–399)
WBC: 3.6

## 2020-07-05 LAB — CBC
MCV: 79 — AB (ref 81–99)
RBC: 4.45 (ref 3.87–5.11)

## 2020-07-05 MED ORDER — CEPHALEXIN 500 MG PO CAPS: 500.0000 mg | ORAL_CAPSULE | Freq: Four times a day (QID) | ORAL | 0 refills | Status: DC

## 2020-07-05 NOTE — Telephone Encounter (Signed)
Per 1/18 next appt sched and given to patient

## 2020-07-06 ENCOUNTER — Ambulatory Visit: Payer: Managed Care, Other (non HMO)

## 2020-07-06 ENCOUNTER — Other Ambulatory Visit: Payer: Self-pay | Admitting: Oncology

## 2020-07-06 ENCOUNTER — Telehealth: Payer: Self-pay | Admitting: *Deleted

## 2020-07-06 NOTE — Telephone Encounter (Signed)
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

## 2020-07-07 ENCOUNTER — Other Ambulatory Visit: Payer: Self-pay | Admitting: Hematology and Oncology

## 2020-07-07 DIAGNOSIS — C50012 Malignant neoplasm of nipple and areola, left female breast: Secondary | ICD-10-CM

## 2020-07-07 DIAGNOSIS — C50912 Malignant neoplasm of unspecified site of left female breast: Secondary | ICD-10-CM

## 2020-07-08 ENCOUNTER — Telehealth: Payer: Self-pay | Admitting: Oncology

## 2020-07-08 ENCOUNTER — Ambulatory Visit: Payer: Managed Care, Other (non HMO)

## 2020-07-08 NOTE — Telephone Encounter (Signed)
07/08/20 spoke with patient and sched Echo

## 2020-07-11 ENCOUNTER — Inpatient Hospital Stay: Payer: Managed Care, Other (non HMO)

## 2020-07-11 ENCOUNTER — Other Ambulatory Visit: Payer: Self-pay

## 2020-07-11 VITALS — BP 106/69 | HR 95 | Temp 98.7°F | Resp 18 | Ht 63.0 in | Wt 203.8 lb

## 2020-07-11 DIAGNOSIS — C50012 Malignant neoplasm of nipple and areola, left female breast: Secondary | ICD-10-CM | POA: Diagnosis present

## 2020-07-11 DIAGNOSIS — Z170 Estrogen receptor positive status [ER+]: Secondary | ICD-10-CM | POA: Diagnosis not present

## 2020-07-11 DIAGNOSIS — Z5112 Encounter for antineoplastic immunotherapy: Secondary | ICD-10-CM | POA: Diagnosis present

## 2020-07-11 DIAGNOSIS — Z79899 Other long term (current) drug therapy: Secondary | ICD-10-CM | POA: Diagnosis not present

## 2020-07-11 MED ORDER — SODIUM CHLORIDE 0.9 % IV SOLN
6.0000 mg/kg | Freq: Once | INTRAVENOUS | Status: AC
Start: 2020-07-11 — End: 2020-07-11
  Administered 2020-07-11: 567 mg via INTRAVENOUS
  Filled 2020-07-11: qty 27

## 2020-07-11 MED ORDER — SODIUM CHLORIDE 0.9 % IV SOLN
Freq: Once | INTRAVENOUS | Status: AC
  Filled 2020-07-11: qty 250

## 2020-07-11 MED ORDER — ACETAMINOPHEN 325 MG PO TABS
650.0000 mg | ORAL_TABLET | Freq: Once | ORAL | Status: AC
  Administered 2020-07-11: 650 mg via ORAL

## 2020-07-11 MED ORDER — HEPARIN SOD (PORK) LOCK FLUSH 100 UNIT/ML IV SOLN
500.0000 [IU] | Freq: Once | INTRAVENOUS | Status: AC | PRN
  Administered 2020-07-11: 500 [IU]
  Filled 2020-07-11: qty 5

## 2020-07-11 MED ORDER — DIPHENHYDRAMINE HCL 50 MG/ML IJ SOLN
INTRAMUSCULAR | Status: AC
  Filled 2020-07-11: qty 1

## 2020-07-11 MED ORDER — DIPHENHYDRAMINE HCL 50 MG/ML IJ SOLN
25.0000 mg | Freq: Once | INTRAMUSCULAR | Status: AC
  Administered 2020-07-11: 25 mg via INTRAVENOUS

## 2020-07-11 NOTE — Patient Instructions (Signed)
[facility] Discharge Instructions for Patients Receiving Chemotherapy  Today you received the following chemotherapy agents Trastuzumab  To help prevent nausea and vomiting after your treatment, we encourage you to take your nausea medication.   If you develop nausea and vomiting that is not controlled by your nausea medication, call the clinic.   BELOW ARE SYMPTOMS THAT SHOULD BE REPORTED IMMEDIATELY:  *FEVER GREATER THAN 100.5 F  *CHILLS WITH OR WITHOUT FEVER  NAUSEA AND VOMITING THAT IS NOT CONTROLLED WITH YOUR NAUSEA MEDICATION  *UNUSUAL SHORTNESS OF BREATH  *UNUSUAL BRUISING OR BLEEDING  TENDERNESS IN MOUTH AND THROAT WITH OR WITHOUT PRESENCE OF ULCERS  *URINARY PROBLEMS  *BOWEL PROBLEMS  UNUSUAL RASH Items with * indicate a potential emergency and should be followed up as soon as possible.  Feel free to call the clinic should you have any questions or concerns at The clinic phone number is [phone].  Please show the CHEMO ALERT CARD at check-in to the Emergency Department and triage nurse.   

## 2020-07-14 ENCOUNTER — Other Ambulatory Visit: Payer: Self-pay

## 2020-07-14 ENCOUNTER — Ambulatory Visit (HOSPITAL_COMMUNITY)
Admission: RE | Admit: 2020-07-14 | Discharge: 2020-07-14 | Disposition: A | Discharge status: Home / Self Care | Payer: Managed Care, Other (non HMO) | Source: Ambulatory Visit | Attending: Hematology and Oncology | Admitting: Hematology and Oncology

## 2020-07-14 DIAGNOSIS — C50012 Malignant neoplasm of nipple and areola, left female breast: Secondary | ICD-10-CM | POA: Diagnosis not present

## 2020-07-14 DIAGNOSIS — C50912 Malignant neoplasm of unspecified site of left female breast: Secondary | ICD-10-CM | POA: Diagnosis not present

## 2020-07-14 DIAGNOSIS — Z01818 Encounter for other preprocedural examination: Secondary | ICD-10-CM | POA: Diagnosis not present

## 2020-07-14 DIAGNOSIS — Z170 Estrogen receptor positive status [ER+]: Secondary | ICD-10-CM | POA: Insufficient documentation

## 2020-07-14 DIAGNOSIS — Z0189 Encounter for other specified special examinations: Secondary | ICD-10-CM | POA: Diagnosis not present

## 2020-07-14 LAB — ECHOCARDIOGRAM COMPLETE
Area-P 1/2: 3.99 cm2
Calc EF: 52.5 %
S' Lateral: 3 cm
Single Plane A2C EF: 49.4 %
Single Plane A4C EF: 55.6 %

## 2020-07-14 NOTE — Progress Notes (Signed)
  Echocardiogram 2D Echocardiogram has been performed.  Ruth White 07/14/2020, 9:52 AM

## 2020-07-20 ENCOUNTER — Telehealth: Payer: Self-pay

## 2020-07-20 ENCOUNTER — Other Ambulatory Visit: Payer: Self-pay | Admitting: Pharmacist

## 2020-07-20 NOTE — Telephone Encounter (Signed)
R/S APPOINTMENT FROM  08/01/20 TO 08/15/20.

## 2020-08-01 ENCOUNTER — Inpatient Hospital Stay: Payer: Managed Care, Other (non HMO)

## 2020-08-08 ENCOUNTER — Other Ambulatory Visit: Payer: Self-pay

## 2020-08-08 ENCOUNTER — Other Ambulatory Visit: Payer: Self-pay | Admitting: Oncology

## 2020-08-08 DIAGNOSIS — C50012 Malignant neoplasm of nipple and areola, left female breast: Secondary | ICD-10-CM

## 2020-08-08 DIAGNOSIS — F32A Depression, unspecified: Secondary | ICD-10-CM

## 2020-08-08 MED ORDER — ESCITALOPRAM OXALATE 20 MG PO TABS: 20.0000 mg | ORAL_TABLET | Freq: Every day | ORAL | 1 refills | Status: DC

## 2020-08-08 MED ORDER — HYDROCODONE-ACETAMINOPHEN 7.5-325 MG PO TABS: 1.0000 | ORAL_TABLET | Freq: Four times a day (QID) | ORAL | 0 refills | Status: DC | PRN

## 2020-08-10 ENCOUNTER — Other Ambulatory Visit: Payer: Self-pay | Admitting: Hematology and Oncology

## 2020-08-10 DIAGNOSIS — F32A Depression, unspecified: Secondary | ICD-10-CM

## 2020-08-10 MED ORDER — ESCITALOPRAM OXALATE 20 MG PO TABS: 20.0000 mg | ORAL_TABLET | Freq: Every day | ORAL | 1 refills | Status: DC

## 2020-08-12 ENCOUNTER — Ambulatory Visit
Admission: RE | Admit: 2020-08-12 | Discharge: 2020-08-12 | Disposition: A | Discharge status: Home / Self Care | Payer: Managed Care, Other (non HMO) | Source: Ambulatory Visit | Attending: Surgery | Admitting: Surgery

## 2020-08-12 ENCOUNTER — Other Ambulatory Visit: Payer: Self-pay | Admitting: Hematology and Oncology

## 2020-08-12 ENCOUNTER — Other Ambulatory Visit: Payer: Self-pay

## 2020-08-12 DIAGNOSIS — Z853 Personal history of malignant neoplasm of breast: Secondary | ICD-10-CM

## 2020-08-15 ENCOUNTER — Inpatient Hospital Stay: Payer: Managed Care, Other (non HMO) | Attending: Oncology

## 2020-08-15 ENCOUNTER — Other Ambulatory Visit: Payer: Self-pay | Admitting: Hematology and Oncology

## 2020-08-15 ENCOUNTER — Other Ambulatory Visit: Payer: Self-pay

## 2020-08-15 VITALS — BP 135/86 | HR 83 | Temp 98.0°F | Resp 18 | Ht 63.0 in | Wt 204.1 lb

## 2020-08-15 DIAGNOSIS — G620 Drug-induced polyneuropathy: Secondary | ICD-10-CM

## 2020-08-15 DIAGNOSIS — Z79899 Other long term (current) drug therapy: Secondary | ICD-10-CM | POA: Insufficient documentation

## 2020-08-15 DIAGNOSIS — C50012 Malignant neoplasm of nipple and areola, left female breast: Secondary | ICD-10-CM | POA: Diagnosis present

## 2020-08-15 DIAGNOSIS — Z170 Estrogen receptor positive status [ER+]: Secondary | ICD-10-CM | POA: Diagnosis not present

## 2020-08-15 DIAGNOSIS — Z5112 Encounter for antineoplastic immunotherapy: Secondary | ICD-10-CM | POA: Diagnosis present

## 2020-08-15 DIAGNOSIS — T451X5A Adverse effect of antineoplastic and immunosuppressive drugs, initial encounter: Secondary | ICD-10-CM

## 2020-08-15 MED ORDER — HEPARIN SOD (PORK) LOCK FLUSH 100 UNIT/ML IV SOLN
500.0000 [IU] | Freq: Once | INTRAVENOUS | Status: AC | PRN
  Administered 2020-08-15: 500 [IU]
  Filled 2020-08-15: qty 5

## 2020-08-15 MED ORDER — ACETAMINOPHEN 325 MG PO TABS
ORAL_TABLET | ORAL | Status: AC
  Filled 2020-08-15: qty 2

## 2020-08-15 MED ORDER — ACETAMINOPHEN 325 MG PO TABS
650.0000 mg | ORAL_TABLET | Freq: Once | ORAL | Status: AC
Start: 2020-08-15 — End: 2020-08-15
  Administered 2020-08-15: 650 mg via ORAL

## 2020-08-15 MED ORDER — DIPHENHYDRAMINE HCL 50 MG/ML IJ SOLN
25.0000 mg | Freq: Once | INTRAMUSCULAR | Status: AC
  Administered 2020-08-15: 25 mg via INTRAVENOUS

## 2020-08-15 MED ORDER — SODIUM CHLORIDE 0.9 % IV SOLN
Freq: Once | INTRAVENOUS | Status: AC
  Filled 2020-08-15: qty 250

## 2020-08-15 MED ORDER — DIPHENHYDRAMINE HCL 50 MG/ML IJ SOLN
INTRAMUSCULAR | Status: AC
  Filled 2020-08-15: qty 1

## 2020-08-15 MED ORDER — TRASTUZUMAB-DKST CHEMO 150 MG IV SOLR
6.0000 mg/kg | Freq: Once | INTRAVENOUS | Status: AC
  Administered 2020-08-15: 567 mg via INTRAVENOUS
  Filled 2020-08-15: qty 27

## 2020-08-15 NOTE — Progress Notes (Signed)
Pt d/c stable at 1200 

## 2020-08-15 NOTE — Progress Notes (Signed)
PT STABLE AT TIME OF DISCHARGE 

## 2020-08-15 NOTE — Patient Instructions (Signed)
[facility] Discharge Instructions for Patients Receiving Chemotherapy  Today you received the following chemotherapy agents Trastuzumab  To help prevent nausea and vomiting after your treatment, we encourage you to take your nausea medication as directed   If you develop nausea and vomiting that is not controlled by your nausea medication, call the clinic.   BELOW ARE SYMPTOMS THAT SHOULD BE REPORTED IMMEDIATELY:  *FEVER GREATER THAN 100.5 F  *CHILLS WITH OR WITHOUT FEVER  NAUSEA AND VOMITING THAT IS NOT CONTROLLED WITH YOUR NAUSEA MEDICATION  *UNUSUAL SHORTNESS OF BREATH  *UNUSUAL BRUISING OR BLEEDING  TENDERNESS IN MOUTH AND THROAT WITH OR WITHOUT PRESENCE OF ULCERS  *URINARY PROBLEMS  *BOWEL PROBLEMS  UNUSUAL RASH Items with * indicate a potential emergency and should be followed up as soon as possible.  Feel free to call the clinic should you have any questions or concerns at The clinic phone number is [phone].  Please show the CHEMO ALERT CARD at check-in to the Emergency Department and triage nurse.  Trastuzumab injection for infusion What is this medicine? TRASTUZUMAB (tras TOO zoo mab) is a monoclonal antibody. It is used to treat breast cancer and stomach cancer. This medicine may be used for other purposes; ask your health care provider or pharmacist if you have questions. COMMON BRAND NAME(S): Herceptin, Herzuma, KANJINTI, Ogivri, Ontruzant, Trazimera What should I tell my health care provider before I take this medicine? They need to know if you have any of these conditions:  heart disease  heart failure  lung or breathing disease, like asthma  an unusual or allergic reaction to trastuzumab, benzyl alcohol, or other medications, foods, dyes, or preservatives  pregnant or trying to get pregnant  breast-feeding How should I use this medicine? This drug is given as an infusion into a vein. It is administered in a hospital  or clinic by a specially trained health care professional. Talk to your pediatrician regarding the use of this medicine in children. This medicine is not approved for use in children. Overdosage: If you think you have taken too much of this medicine contact a poison control center or emergency room at once. NOTE: This medicine is only for you. Do not share this medicine with others. What if I miss a dose? It is important not to miss a dose. Call your doctor or health care professional if you are unable to keep an appointment. What may interact with this medicine? This medicine may interact with the following medications:  certain types of chemotherapy, such as daunorubicin, doxorubicin, epirubicin, and idarubicin This list may not describe all possible interactions. Give your health care provider a list of all the medicines, herbs, non-prescription drugs, or dietary supplements you use. Also tell them if you smoke, drink alcohol, or use illegal drugs. Some items may interact with your medicine. What should I watch for while using this medicine? Visit your doctor for checks on your progress. Report any side effects. Continue your course of treatment even though you feel ill unless your doctor tells you to stop. Call your doctor or health care professional for advice if you get a fever, chills or sore throat, or other symptoms of a cold or flu. Do not treat yourself. Try to avoid being around people who are sick. You may experience fever, chills and shaking during your first infusion. These effects are usually mild and can be treated with other medicines. Report any side effects during the infusion to your health care professional.   Fever and chills usually do not happen with later infusions. Do not become pregnant while taking this medicine or for 7 months after stopping it. Women should inform their doctor if they wish to become pregnant or think they might be pregnant. Women of child-bearing potential  will need to have a negative pregnancy test before starting this medicine. There is a potential for serious side effects to an unborn child. Talk to your health care professional or pharmacist for more information. Do not breast-feed an infant while taking this medicine or for 7 months after stopping it. Women must use effective birth control with this medicine. What side effects may I notice from receiving this medicine? Side effects that you should report to your doctor or health care professional as soon as possible:  allergic reactions like skin rash, itching or hives, swelling of the face, lips, or tongue  chest pain or palpitations  cough  dizziness  feeling faint or lightheaded, falls  fever  general ill feeling or flu-like symptoms  signs of worsening heart failure like breathing problems; swelling in your legs and feet  unusually weak or tired Side effects that usually do not require medical attention (report to your doctor or health care professional if they continue or are bothersome):  bone pain  changes in taste  diarrhea  joint pain  nausea/vomiting  weight loss This list may not describe all possible side effects. Call your doctor for medical advice about side effects. You may report side effects to FDA at 1-800-FDA-1088. Where should I keep my medicine? This drug is given in a hospital or clinic and will not be stored at home. NOTE: This sheet is a summary. It may not cover all possible information. If you have questions about this medicine, talk to your doctor, pharmacist, or health care provider.  2021 Elsevier/Gold Standard (2016-05-29 14:37:52)  

## 2020-08-18 ENCOUNTER — Telehealth: Payer: Self-pay | Admitting: Oncology

## 2020-08-18 NOTE — Telephone Encounter (Signed)
08/18/20 spoke with patient and resch appts

## 2020-08-22 ENCOUNTER — Ambulatory Visit: Payer: Managed Care, Other (non HMO)

## 2020-08-30 NOTE — Progress Notes (Deleted)
[facility]  [address] 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  She was placed on weekly paclitaxel/Herceptin for her adjuvant therapy.  However, due to significant peripheral neuropathy, her paclitaxel was discontinued a few weeks before the entire 12 weekly treatments were completed.  She is currently on maintenance Herceptin, which is being given once every 3 weeks. She also takes anastrazole on a daily basis for her adjuvant endocrine therapy.  She completed adjuvant radiation to the left breast in October.  She still had decreased range of motion of her left arm, so underwent physical therapy.  INTERVAL HISTORY:  Ruth White is here today for routine follow up.  Since her last visit, the patient has been doing fairly well, although, she reports continued fatigue. She asks what she can do for the fatigue.  She continues trastuzumab every 3 weeks and denies having any other significant problems with this.  She reports tenderness and swelling of the lateral left breast. She denies fevers or chills.  She has chronic constipation, for which she uses senna and MiraLax.  She had a bowel movement today. She continues to have decreased range of motion of the left shoulder and requests a referral back for further physical therapy. Her appetite is good. Her weight has decreased 3 pounds over last 3 weeks.  She is overdue for bilateral diagnostic mammogram, which is now scheduled for February.  She will be due for an echocardiogram prior to her next treatment.  REVIEW OF SYSTEMS:  Review of  Systems  Constitutional: Positive for fatigue. Negative for appetite change, chills, fever and unexpected weight change.  HENT:   Negative for lump/mass, mouth sores and sore throat.   Respiratory: Negative for cough and shortness of breath.   Cardiovascular: Negative for chest pain and leg swelling.  Gastrointestinal: Positive for constipation (chronic). Negative for abdominal pain, diarrhea, nausea and vomiting.  Genitourinary: Negative for difficulty urinating, dysuria, frequency and hematuria.   Musculoskeletal: Positive for back pain (chronic). Negative for arthralgias and myalgias.  Skin: Negative for rash.  Neurological: Negative for dizziness and headaches.  Psychiatric/Behavioral: Negative for depression and sleep disturbance. The patient is not nervous/anxious.      VITALS:  Last menstrual period 08/12/2020.  Wt Readings from Last 3 Encounters:  08/15/20 204 lb 1.9 oz (92.6 kg)  07/11/20 203 lb 12 oz (92.4 kg)  07/05/20 203 lb 11.2 oz (92.4 kg)    There is no height or weight on file to calculate BMI.  Performance status (ECOG): 1 - Symptomatic but completely ambulatory  PHYSICAL EXAM:  Physical Exam Vitals and nursing note reviewed.  Constitutional:      General: She is not in acute distress.    Appearance: Normal appearance.  HENT:     Mouth/Throat:     Mouth: Mucous membranes are moist.     Pharynx: Oropharynx is clear. No oropharyngeal exudate or posterior oropharyngeal erythema.  Eyes:     General: No scleral icterus.    Extraocular Movements: Extraocular movements intact.     Conjunctiva/sclera: Conjunctivae normal.     Pupils:  Pupils are equal, round, and reactive to light.  Cardiovascular:     Rate and Rhythm: Normal rate and regular rhythm.     Heart sounds: Normal heart sounds. No murmur heard. No friction rub. No gallop.   Pulmonary:     Effort: No respiratory distress.     Breath sounds: Normal breath sounds. No stridor. No wheezing, rhonchi or  rales.  Chest:  Breasts:     Right: Normal. No swelling, bleeding, inverted nipple, mass, nipple discharge, skin change, tenderness, axillary adenopathy or supraclavicular adenopathy.     Left: Swelling, skin change and tenderness present. No bleeding, inverted nipple, mass, nipple discharge, axillary adenopathy or supraclavicular adenopathy.      Comments: There is mild swelling, tenderness and erythema of the lateral left breast. Abdominal:     General: There is no distension.     Palpations: Abdomen is soft. There is no hepatomegaly, splenomegaly or mass.     Tenderness: There is no abdominal tenderness. There is no guarding.     Hernia: No hernia is present.  Musculoskeletal:     Left shoulder: Tenderness present. No swelling, deformity or effusion. Decreased range of motion. Decreased strength.     Cervical back: Normal range of motion and neck supple. No tenderness.     Right lower leg: No edema.     Left lower leg: No edema.  Lymphadenopathy:     Cervical: No cervical adenopathy.     Upper Body:     Right upper body: No supraclavicular or axillary adenopathy.     Left upper body: No supraclavicular or axillary adenopathy.     Lower Body: No right inguinal adenopathy. No left inguinal adenopathy.  Skin:    General: Skin is warm and dry.     Coloration: Skin is not jaundiced.     Findings: No rash.  Neurological:     Mental Status: She is alert and oriented to person, place, and time.     Cranial Nerves: No cranial nerve deficit.  Psychiatric:        Mood and Affect: Mood normal.        Behavior: Behavior normal.        Thought Content: Thought content normal.    LABS:   CBC Latest Ref Rng & Units 07/05/2020 03/18/2020  WBC - 3.6 2.7  Hemoglobin 12.0 - 16.0 11.6(A) 11.3(A)  Hematocrit 36 - 46 35(A) 35(A)  Platelets 150 - 399 186 182   CMP Latest Ref Rng & Units 07/05/2020 03/18/2020  BUN 4 - 21 26(A) 16  Creatinine 0.5 - 1.1 1.5(A) 1.0  Sodium 137 - 147 139 140   Potassium 3.4 - 5.3 4.2 4.1  Chloride 99 - 108 105 107  CO2 13 - 22 25(A) -  Calcium 8.7 - 10.7 9.7 9.6  Alkaline Phos 25 - 125 57 49  AST 13 - 35 37(A) 31  ALT 7 - 35 27 26     No results found for: CEA1 / No results found for: CEA1 No results found for: PSA1 No results found for: WCB762 No results found for: CAN125  No results found for: TOTALPROTELP, ALBUMINELP, A1GS, A2GS, BETS, BETA2SER, GAMS, MSPIKE, SPEI No results found for: TIBC, FERRITIN, IRONPCTSAT No results found for: LDH  STUDIES:  MM DIAG BREAST TOMO BILATERAL  Result Date: 08/12/2020 CLINICAL DATA:  Left lumpectomy.  Annual mammography. EXAM: DIGITAL DIAGNOSTIC BILATERAL MAMMOGRAM WITH TOMOSYNTHESIS AND CAD TECHNIQUE: Bilateral digital diagnostic mammography and breast tomosynthesis was performed. The images  were evaluated with computer-aided detection. COMPARISON:  Previous exam(s). ACR Breast Density Category b: There are scattered areas of fibroglandular density. FINDINGS: Left lumpectomy site appears as expected. Skin thickening from previous radiation identified. No suspicious masses, calcifications, or distortion identified in either breast. IMPRESSION: No mammographic evidence of malignancy. RECOMMENDATION: Annual diagnostic mammography. I have discussed the findings and recommendations with the patient. If applicable, a reminder letter will be sent to the patient regarding the next appointment. BI-RADS CATEGORY  2: Benign. Electronically Signed   By: Dorise Bullion III M.D   On: 08/12/2020 10:25      HISTORY:   Past Medical History:  Diagnosis Date  . 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  No family history on file.  Social History:  reports that she has never smoked. She has never used smokeless tobacco. No history on file for alcohol use and drug use.The patient is  accompanied by her husband today.  Allergies: No Known Allergies  Current Medications: Current Outpatient Medications  Medication Sig Dispense Refill  . anastrozole (ARIMIDEX) 1 MG tablet Take 1 mg by mouth daily.    Marland Kitchen aspirin 325 MG EC tablet Take 325 mg by mouth daily.    Marland Kitchen atenolol (TENORMIN) 50 MG tablet Take 50 mg by mouth daily.    . cephALEXin (KEFLEX) 500 MG capsule Take 1 capsule (500 mg total) by mouth 4 (four) times daily. 40 capsule 0  . chlorhexidine (PERIDEX) 0.12 % solution     . Cholecalciferol 50 MCG (2000 UT) TABS Take by mouth.    . [employer] PURELAX 17 GM/SCOOP powder Take by mouth.    . dexamethasone (DECADRON) 4 MG tablet Take 4 mg by mouth 2 (two) times daily with a meal.    . diclofenac Sodium (VOLTAREN) 1 % GEL Apply topically.    Marland Kitchen escitalopram (LEXAPRO) 20 MG tablet Take 1 tablet (20 mg total) by mouth daily. 90 tablet 1  . ezetimibe (ZETIA) 10 MG tablet Take 10 mg by mouth daily.    . hydrochlorothiazide (HYDRODIURIL) 12.5 MG tablet Take 12.5 mg by mouth daily.    Marland Kitchen HYDROcodone-acetaminophen (NORCO) 7.5-325 MG tablet Take 1 tablet by mouth every 6 (six) hours as needed for moderate pain. 50 tablet 0  . ibuprofen (ADVIL) 800 MG tablet Take 800 mg by mouth every 8 (eight) hours as needed.    . lidocaine-prilocaine (EMLA) cream Apply 1 application topically as needed.    . liraglutide (VICTOZA) 18 MG/3ML SOPN Inject into the skin.    Marland Kitchen lisinopril (ZESTRIL) 40 MG tablet Take 40 mg by mouth daily.    . magic mouthwash SOLN Take 5 mLs by mouth.    . magnesium citrate SOLN Take 1 Bottle by mouth once.    . metFORMIN (GLUCOPHAGE) 500 MG tablet Take by mouth 2 (two) times daily with a meal.    . ondansetron (ZOFRAN) 4 MG tablet Take 4 mg by mouth every 8 (eight) hours as needed for nausea or vomiting.    . ondansetron (ZOFRAN-ODT) 4 MG disintegrating tablet Take 4 mg by mouth every 8 (eight) hours as needed.    . polyethylene glycol (MIRALAX / GLYCOLAX) 17 g packet Take 17 g  by mouth daily.    . pregabalin (LYRICA) 75 MG capsule TAKE 1 CAPSULE BY MOUTH TWICE A DAY 180 capsule 0  .  prochlorperazine (COMPAZINE) 10 MG tablet Take 10 mg by mouth every 6 (six) hours as needed for nausea or vomiting.    . rosuvastatin (CRESTOR) 40 MG tablet Take 40 mg by mouth daily.    Marland Kitchen senna-docusate (SENOKOT-S) 8.6-50 MG tablet Take 1 tablet by mouth daily.    . traMADol (ULTRAM) 50 MG tablet Take by mouth every 6 (six) hours as needed.    . vitamin C (ASCORBIC ACID) 250 MG tablet Take 500 mg by mouth daily.     No current facility-administered medications for this visit.     ASSESSMENT & PLAN:   Assessment/Plan: 1. Stage IA multifocal hormone/her 2 Neu receptor positive breast cancer.  The patient is currently receiving maintenance Herceptin once every 3 weeks to complete 1 full year of anti-her2 therapy.  She knows to continue taking her anastrozole daily for 5 total years of adjuvant endocrine therapy.   2. Mild anemia, which is now microcytic. I will evaluate this with iron studies.    3. Probable mastitis of the left breast. I will place her on cephalexin. If her symptoms do not resolve she knows to contact us. 4. Fatigue, which is likely multifactorial.  I advised her that mild exercise can help fatigue related to cancer and cancer treatment.  She knows to her breast when needed. 5. Persistent decreased range of motion and pain of the left shoulder.  I will refer her for physical therapy again at this time.  We will see her back in March for repeat clinical assessment as she completes her HER2 targeted therapy.  The patient and her husband understand all the plans discussed today and are in agreement with them.  They knows to contact our office if she develops concerns prior to her next appointment.   Shai Rasmussen Macarthur Critchley, MD

## 2020-08-31 ENCOUNTER — Inpatient Hospital Stay: Payer: Managed Care, Other (non HMO)

## 2020-08-31 ENCOUNTER — Inpatient Hospital Stay: Payer: Managed Care, Other (non HMO) | Attending: Oncology | Admitting: Oncology

## 2020-08-31 ENCOUNTER — Telehealth: Payer: Self-pay | Admitting: Oncology

## 2020-08-31 ENCOUNTER — Other Ambulatory Visit: Payer: Self-pay

## 2020-08-31 ENCOUNTER — Other Ambulatory Visit: Payer: Self-pay | Admitting: Oncology

## 2020-08-31 VITALS — BP 134/89 | HR 80 | Temp 98.5°F | Resp 16 | Ht 63.0 in | Wt 209.6 lb

## 2020-08-31 DIAGNOSIS — C50012 Malignant neoplasm of nipple and areola, left female breast: Secondary | ICD-10-CM | POA: Insufficient documentation

## 2020-08-31 DIAGNOSIS — Z170 Estrogen receptor positive status [ER+]: Secondary | ICD-10-CM | POA: Insufficient documentation

## 2020-08-31 DIAGNOSIS — Z5112 Encounter for antineoplastic immunotherapy: Secondary | ICD-10-CM | POA: Insufficient documentation

## 2020-08-31 DIAGNOSIS — Z79899 Other long term (current) drug therapy: Secondary | ICD-10-CM | POA: Insufficient documentation

## 2020-08-31 MED ORDER — HYDROCODONE-ACETAMINOPHEN 10-325 MG PO TABS: 1.0000 | ORAL_TABLET | Freq: Four times a day (QID) | ORAL | 0 refills | Status: DC | PRN

## 2020-08-31 NOTE — Progress Notes (Signed)
Patient still complains of extensive pain in hands, feet, legs, left  Breast, and l shoulder.  MRI is scheduled of left shoulder next month.  Pt is having "hot flashes" at night.  Was asking when neuropathy in hands and feet will go away.  I advised that it may never totally go away, but should get somewhat better over time.

## 2020-08-31 NOTE — Progress Notes (Signed)
Ruth White  [address] [place],  [place]  [address] [phone]  Clinic Day:  08/31/2020  Referring physician: Marco Collie, MD   HISTORY OF PRESENT ILLNESS:  The patient is a 60 y.o. 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  She was placed on weekly paclitaxel/Herceptin for her adjuvant therapy.  However, due to significant peripheral neuropathy, her paclitaxel was discontinued a few weeks before the entire 12 weekly treatments were completed.  She is currently on maintenance Herceptin, which is being given once every 3 weeks, for which she is nearly completing her 1 year of Her2 therapy.  She also has completed her adjuvant breast radiation.  She takes anastrazole on a daily basis for her adjuvant endocrine therapy.  She comes in today for routine followup.  Since her last visit, the patient has been doing okay.  She denies having any significant problems with her maintenance Herceptin.  She still has a decreased range of motion in her left shoulder, for which orthopedics is ordering an MRI.  Her neuropathy remains prominent in her hands and feet to where she requests her hydrocodone dose to be increased.  Of note, her annual mammogram done in February 2022 showed no evidence of disease recurrence.   PHYSICAL EXAM:  Blood pressure 134/89, pulse 80, temperature 98.5 F (36.9 C), resp. rate 16, height _0  (1.6 m), weight 209 lb 9.6 oz (95.1 kg), last menstrual period 08/12/2020, SpO2 98 %. Wt Readings from Last 3 Encounters:  08/31/20 209 lb 9.6 oz (95.1 kg)  08/15/20 204 lb 1.9 oz (92.6 kg)  07/11/20 203 lb 12 oz (92.4 kg)   Body mass index is 37.13 kg/m. Performance status (ECOG): 1 - Symptomatic but completely ambulatory Physical Exam Constitutional:      Appearance: Normal appearance.  HENT:     Mouth/Throat:     Pharynx: Oropharynx is clear. No oropharyngeal exudate.  Cardiovascular:      Rate and Rhythm: Normal rate and regular rhythm.     Heart sounds: No murmur heard. No friction rub. No gallop.   Pulmonary:     Breath sounds: Normal breath sounds.  Chest:  Breasts:     Right: No swelling, bleeding, inverted nipple, mass, nipple discharge, skin change, axillary adenopathy or supraclavicular adenopathy.     Left: No swelling, bleeding, inverted nipple, mass, nipple discharge, skin change, axillary adenopathy or supraclavicular adenopathy.    Abdominal:     General: Bowel sounds are normal. There is no distension.     Palpations: Abdomen is soft. There is no mass.     Tenderness: There is no abdominal tenderness.  Musculoskeletal:        General: No tenderness.     Cervical back: Normal range of motion and neck supple.     Right lower leg: No edema.     Left lower leg: No edema.  Lymphadenopathy:     Cervical: No cervical adenopathy.     Right cervical: No superficial, deep or posterior cervical adenopathy.    Left cervical: No superficial, deep or posterior cervical adenopathy.     Upper Body:     Right upper body: No supraclavicular or axillary adenopathy.     Left upper body: No supraclavicular or axillary adenopathy.     Lower Body: No right inguinal adenopathy. No left inguinal adenopathy.  Skin:    Coloration: Skin is not jaundiced.     Findings: No lesion  or rash.  Neurological:     General: No focal deficit present.     Mental Status: She is alert and oriented to person, place, and time. Mental status is at baseline.  Psychiatric:        Mood and Affect: Mood normal.        Behavior: Behavior normal.        Thought Content: Thought content normal.        Judgment: Judgment normal.    STUDIES:  MM DIAG BREAST TOMO BILATERAL  Result Date: 08/12/2020 CLINICAL DATA:  Left lumpectomy.  Annual mammography. EXAM: DIGITAL DIAGNOSTIC BILATERAL MAMMOGRAM WITH TOMOSYNTHESIS AND CAD TECHNIQUE: Bilateral digital diagnostic mammography and breast tomosynthesis  was performed. The images were evaluated with computer-aided detection. COMPARISON:  Previous exam(s). ACR Breast Density Category b: There are scattered areas of fibroglandular density. FINDINGS: Left lumpectomy site appears as expected. Skin thickening from previous radiation identified. No suspicious masses, calcifications, or distortion identified in either breast. IMPRESSION: No mammographic evidence of malignancy. RECOMMENDATION: Annual diagnostic mammography. I have discussed the findings and recommendations with the patient. If applicable, a reminder letter will be sent to the patient regarding the next appointment. BI-RADS CATEGORY  2: Benign. Electronically Signed   By: Dorise Bullion III M.D   On: 08/12/2020 10:25   ASSESSMENT & PLAN:  Assessment/Plan:  A 60 y.o. 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  Based upon her clinical breast exam today and recent mammogram, the patient remains disease free.  The patient is near the completion of her year-long maintenance Herceptin.  She knows to continue taking her anastrozole daily for 5 total years of adjuvant endocrine therapy.  With respect to her neuropathy, I will increase her hydrocodone/APAP to 10/325, which she can take every 6 hours.  She also knows to continue her Lyrica as well for her neuropathy.  I do believe occupational therapy could help with her hand dexterity, but she wishes to delay this referral until after she hears from the MRI of her left shoulder.  From a breast cancer standpoint, the patient is doing well.  I will see her back in 4 months for repeat clinical assessment.  The patient understands all the plans discussed today and is in agreement with them.    Ruth Elman Macarthur Critchley, MD

## 2020-08-31 NOTE — Telephone Encounter (Signed)
Per 3/16 LOS, patient scheduled for 4/11 Infusion, 7/15 Follow Up.  Gave patient Appt Summary

## 2020-09-01 ENCOUNTER — Ambulatory Visit: Payer: Managed Care, Other (non HMO) | Admitting: Oncology

## 2020-09-01 ENCOUNTER — Other Ambulatory Visit: Payer: Managed Care, Other (non HMO)

## 2020-09-05 ENCOUNTER — Other Ambulatory Visit: Payer: Self-pay

## 2020-09-05 ENCOUNTER — Inpatient Hospital Stay: Payer: Managed Care, Other (non HMO)

## 2020-09-05 VITALS — BP 122/74 | HR 78 | Temp 97.8°F | Resp 18 | Ht 63.0 in | Wt 204.0 lb

## 2020-09-05 DIAGNOSIS — C50012 Malignant neoplasm of nipple and areola, left female breast: Secondary | ICD-10-CM | POA: Diagnosis present

## 2020-09-05 DIAGNOSIS — Z5112 Encounter for antineoplastic immunotherapy: Secondary | ICD-10-CM | POA: Diagnosis present

## 2020-09-05 DIAGNOSIS — Z170 Estrogen receptor positive status [ER+]: Secondary | ICD-10-CM | POA: Diagnosis not present

## 2020-09-05 DIAGNOSIS — Z79899 Other long term (current) drug therapy: Secondary | ICD-10-CM | POA: Diagnosis not present

## 2020-09-05 MED ORDER — ACETAMINOPHEN 325 MG PO TABS
ORAL_TABLET | ORAL | Status: AC
  Filled 2020-09-05: qty 2

## 2020-09-05 MED ORDER — SODIUM CHLORIDE 0.9 % IV SOLN
Freq: Once | INTRAVENOUS | Status: AC
  Filled 2020-09-05: qty 250

## 2020-09-05 MED ORDER — HEPARIN SOD (PORK) LOCK FLUSH 100 UNIT/ML IV SOLN
500.0000 [IU] | Freq: Once | INTRAVENOUS | Status: AC | PRN
  Administered 2020-09-05: 500 [IU]
  Filled 2020-09-05: qty 5

## 2020-09-05 MED ORDER — ACETAMINOPHEN 325 MG PO TABS
650.0000 mg | ORAL_TABLET | Freq: Once | ORAL | Status: AC
  Administered 2020-09-05: 650 mg via ORAL

## 2020-09-05 MED ORDER — TRASTUZUMAB-ANNS CHEMO 150 MG IV SOLR
6.0000 mg/kg | Freq: Once | INTRAVENOUS | Status: AC
  Administered 2020-09-05: 567 mg via INTRAVENOUS
  Filled 2020-09-05: qty 27

## 2020-09-05 MED ORDER — DIPHENHYDRAMINE HCL 50 MG/ML IJ SOLN
INTRAMUSCULAR | Status: AC
  Filled 2020-09-05: qty 1

## 2020-09-05 MED ORDER — DIPHENHYDRAMINE HCL 50 MG/ML IJ SOLN
25.0000 mg | Freq: Once | INTRAMUSCULAR | Status: AC
  Administered 2020-09-05: 25 mg via INTRAVENOUS

## 2020-09-05 NOTE — Patient Instructions (Signed)
[facility] Discharge Instructions for Patients Receiving Chemotherapy  Today you received the following chemotherapy agents Trastuzumab  To help prevent nausea and vomiting after your treatment, we encourage you to take your nausea medication as directed.   If you develop nausea and vomiting that is not controlled by your nausea medication, call the clinic.   BELOW ARE SYMPTOMS THAT SHOULD BE REPORTED IMMEDIATELY:  *FEVER GREATER THAN 100.5 F  *CHILLS WITH OR WITHOUT FEVER  NAUSEA AND VOMITING THAT IS NOT CONTROLLED WITH YOUR NAUSEA MEDICATION  *UNUSUAL SHORTNESS OF BREATH  *UNUSUAL BRUISING OR BLEEDING  TENDERNESS IN MOUTH AND THROAT WITH OR WITHOUT PRESENCE OF ULCERS  *URINARY PROBLEMS  *BOWEL PROBLEMS  UNUSUAL RASH Items with * indicate a potential emergency and should be followed up as soon as possible.  Feel free to call the clinic should you have any questions or concerns at The clinic phone number is [phone].  Please show the CHEMO ALERT CARD at check-in to the Emergency Department and triage nurse.   

## 2020-09-05 NOTE — Progress Notes (Signed)
1045: PT STABLE AT TIME OF DISCHARGE

## 2020-09-08 ENCOUNTER — Ambulatory Visit: Payer: Managed Care, Other (non HMO) | Admitting: Oncology

## 2020-09-08 ENCOUNTER — Other Ambulatory Visit: Payer: Managed Care, Other (non HMO)

## 2020-09-12 ENCOUNTER — Ambulatory Visit: Payer: Managed Care, Other (non HMO)

## 2020-09-26 ENCOUNTER — Other Ambulatory Visit: Payer: Self-pay

## 2020-09-26 ENCOUNTER — Inpatient Hospital Stay: Payer: Managed Care, Other (non HMO) | Attending: Oncology

## 2020-09-26 VITALS — BP 130/85 | HR 80 | Temp 98.2°F | Resp 18 | Ht 63.0 in | Wt 204.1 lb

## 2020-09-26 DIAGNOSIS — Z5112 Encounter for antineoplastic immunotherapy: Secondary | ICD-10-CM | POA: Insufficient documentation

## 2020-09-26 DIAGNOSIS — Z170 Estrogen receptor positive status [ER+]: Secondary | ICD-10-CM | POA: Insufficient documentation

## 2020-09-26 DIAGNOSIS — Z79899 Other long term (current) drug therapy: Secondary | ICD-10-CM | POA: Insufficient documentation

## 2020-09-26 DIAGNOSIS — C50012 Malignant neoplasm of nipple and areola, left female breast: Secondary | ICD-10-CM | POA: Diagnosis present

## 2020-09-26 MED ORDER — ACETAMINOPHEN 325 MG PO TABS
ORAL_TABLET | ORAL | Status: AC
  Filled 2020-09-26: qty 2

## 2020-09-26 MED ORDER — SODIUM CHLORIDE 0.9 % IV SOLN
Freq: Once | INTRAVENOUS | Status: AC
  Filled 2020-09-26: qty 250

## 2020-09-26 MED ORDER — HEPARIN SOD (PORK) LOCK FLUSH 100 UNIT/ML IV SOLN
500.0000 [IU] | Freq: Once | INTRAVENOUS | Status: AC | PRN
  Administered 2020-09-26: 500 [IU]
  Filled 2020-09-26: qty 5

## 2020-09-26 MED ORDER — TRASTUZUMAB-ANNS CHEMO 150 MG IV SOLR
6.0000 mg/kg | Freq: Once | INTRAVENOUS | Status: AC
  Administered 2020-09-26: 567 mg via INTRAVENOUS
  Filled 2020-09-26: qty 27

## 2020-09-26 MED ORDER — DIPHENHYDRAMINE HCL 50 MG/ML IJ SOLN
25.0000 mg | Freq: Once | INTRAMUSCULAR | Status: AC
  Administered 2020-09-26: 25 mg via INTRAVENOUS

## 2020-09-26 MED ORDER — DIPHENHYDRAMINE HCL 50 MG/ML IJ SOLN
INTRAMUSCULAR | Status: AC
  Filled 2020-09-26: qty 1

## 2020-09-26 MED ORDER — ACETAMINOPHEN 325 MG PO TABS
650.0000 mg | ORAL_TABLET | Freq: Once | ORAL | Status: AC
  Administered 2020-09-26: 650 mg via ORAL

## 2020-09-26 NOTE — Progress Notes (Signed)
Pt d/c stable at 1115

## 2020-09-26 NOTE — Patient Instructions (Signed)
[facility] [place] Discharge Instructions for Patients Receiving Chemotherapy  Today you received the following chemotherapy agents Trastuzumab  To help prevent nausea and vomiting after your treatment, we encourage you to take your nausea medication as directed   If you develop nausea and vomiting that is not controlled by your nausea medication, call the clinic.   BELOW ARE SYMPTOMS THAT SHOULD BE REPORTED IMMEDIATELY:  *FEVER GREATER THAN 100.5 F  *CHILLS WITH OR WITHOUT FEVER  NAUSEA AND VOMITING THAT IS NOT CONTROLLED WITH YOUR NAUSEA MEDICATION  *UNUSUAL SHORTNESS OF BREATH  *UNUSUAL BRUISING OR BLEEDING  TENDERNESS IN MOUTH AND THROAT WITH OR WITHOUT PRESENCE OF ULCERS  *URINARY PROBLEMS  *BOWEL PROBLEMS  UNUSUAL RASH Items with * indicate a potential emergency and should be followed up as soon as possible.  Feel free to call the clinic should you have any questions or concerns at The clinic phone number is [phone].  Please show the CHEMO ALERT CARD at check-in to the Emergency Department and triage nurse.  Trastuzumab injection for infusion What is this medicine? TRASTUZUMAB (tras TOO zoo mab) is a monoclonal antibody. It is used to treat breast cancer and stomach cancer. This medicine may be used for other purposes; ask your health care provider or pharmacist if you have questions. COMMON BRAND NAME(S): Herceptin, Herzuma, KANJINTI, Ogivri, Ontruzant, Trazimera What should I tell my health care provider before I take this medicine? They need to know if you have any of these conditions:  heart disease  heart failure  lung or breathing disease, like asthma  an unusual or allergic reaction to trastuzumab, benzyl alcohol, or other medications, foods, dyes, or preservatives  pregnant or trying to get pregnant  breast-feeding How should I use this medicine? This drug is given as an infusion into a vein. It is administered in a hospital  or clinic by a specially trained health care professional. Talk to your pediatrician regarding the use of this medicine in children. This medicine is not approved for use in children. Overdosage: If you think you have taken too much of this medicine contact a poison control center or emergency room at once. NOTE: This medicine is only for you. Do not share this medicine with others. What if I miss a dose? It is important not to miss a dose. Call your doctor or health care professional if you are unable to keep an appointment. What may interact with this medicine? This medicine may interact with the following medications:  certain types of chemotherapy, such as daunorubicin, doxorubicin, epirubicin, and idarubicin This list may not describe all possible interactions. Give your health care provider a list of all the medicines, herbs, non-prescription drugs, or dietary supplements you use. Also tell them if you smoke, drink alcohol, or use illegal drugs. Some items may interact with your medicine. What should I watch for while using this medicine? Visit your doctor for checks on your progress. Report any side effects. Continue your course of treatment even though you feel ill unless your doctor tells you to stop. Call your doctor or health care professional for advice if you get a fever, chills or sore throat, or other symptoms of a cold or flu. Do not treat yourself. Try to avoid being around people who are sick. You may experience fever, chills and shaking during your first infusion. These effects are usually mild and can be treated with other medicines. Report any side effects during the infusion to your health care professional.   Fever and chills usually do not happen with later infusions. Do not become pregnant while taking this medicine or for 7 months after stopping it. Women should inform their doctor if they wish to become pregnant or think they might be pregnant. Women of child-bearing potential  will need to have a negative pregnancy test before starting this medicine. There is a potential for serious side effects to an unborn child. Talk to your health care professional or pharmacist for more information. Do not breast-feed an infant while taking this medicine or for 7 months after stopping it. Women must use effective birth control with this medicine. What side effects may I notice from receiving this medicine? Side effects that you should report to your doctor or health care professional as soon as possible:  allergic reactions like skin rash, itching or hives, swelling of the face, lips, or tongue  chest pain or palpitations  cough  dizziness  feeling faint or lightheaded, falls  fever  general ill feeling or flu-like symptoms  signs of worsening heart failure like breathing problems; swelling in your legs and feet  unusually weak or tired Side effects that usually do not require medical attention (report to your doctor or health care professional if they continue or are bothersome):  bone pain  changes in taste  diarrhea  joint pain  nausea/vomiting  weight loss This list may not describe all possible side effects. Call your doctor for medical advice about side effects. You may report side effects to FDA at 1-800-FDA-1088. Where should I keep my medicine? This drug is given in a hospital or clinic and will not be stored at home. NOTE: This sheet is a summary. It may not cover all possible information. If you have questions about this medicine, talk to your doctor, pharmacist, or health care provider.  2021 Elsevier/Gold Standard (2016-05-29 14:37:52)  

## 2020-10-11 DIAGNOSIS — Z923 Personal history of irradiation: Secondary | ICD-10-CM | POA: Insufficient documentation

## 2020-10-12 ENCOUNTER — Other Ambulatory Visit: Payer: Self-pay

## 2020-10-12 DIAGNOSIS — G620 Drug-induced polyneuropathy: Secondary | ICD-10-CM

## 2020-10-12 DIAGNOSIS — T451X5A Adverse effect of antineoplastic and immunosuppressive drugs, initial encounter: Secondary | ICD-10-CM

## 2020-10-12 MED ORDER — HYDROCODONE-ACETAMINOPHEN 10-325 MG PO TABS: 1.0000 | ORAL_TABLET | Freq: Four times a day (QID) | ORAL | 0 refills | Status: DC | PRN

## 2020-10-20 ENCOUNTER — Other Ambulatory Visit: Payer: Self-pay

## 2020-10-20 ENCOUNTER — Telehealth: Payer: Self-pay

## 2020-10-20 DIAGNOSIS — K5903 Drug induced constipation: Secondary | ICD-10-CM

## 2020-10-20 MED ORDER — LACTULOSE 10 GM/15ML PO SOLN: ORAL | 2 refills | Status: DC

## 2020-10-20 NOTE — Telephone Encounter (Addendum)
Lactulose script sent in by [facility] P,NP.   RE: CONSTIPATION Received: Today Melodye Ped, NP  Dairl Ponder, RN Yes, let's try lactulose first, please.    10/20/20 @ 1430  I spoke with pt. She is taking Senokot 1 tab QID w/ pain med. She also takes Miralax once daily. She states, "I had a couple little balls yesterday". She has also used mag citrate in past and didn't really work well for her.  I told her the goal is for her to have a BM at least every other day. She mentioned Movantik, as she discussed with pharmacist about it. Wasn't sure if you would rather try lactulose?   10/20/20 @ 0957:  I attempted call to pt to get more information on when her last BM was? What is she taking daily for constipation?

## 2020-11-03 ENCOUNTER — Other Ambulatory Visit: Payer: Self-pay

## 2020-11-03 DIAGNOSIS — K5903 Drug induced constipation: Secondary | ICD-10-CM

## 2020-11-03 DIAGNOSIS — F32A Depression, unspecified: Secondary | ICD-10-CM

## 2020-11-03 DIAGNOSIS — K5900 Constipation, unspecified: Secondary | ICD-10-CM

## 2020-11-03 MED ORDER — SENNOSIDES-DOCUSATE SODIUM 8.6-50 MG PO TABS: 2.0000 | ORAL_TABLET | Freq: Two times a day (BID) | ORAL | 2 refills | Status: DC

## 2020-11-03 MED ORDER — ESCITALOPRAM OXALATE 20 MG PO TABS: 20.0000 mg | ORAL_TABLET | Freq: Every day | ORAL | 2 refills | Status: DC

## 2020-11-04 ENCOUNTER — Other Ambulatory Visit: Payer: Self-pay

## 2020-11-04 DIAGNOSIS — K5900 Constipation, unspecified: Secondary | ICD-10-CM

## 2020-11-04 MED ORDER — CVS PURELAX 17 GM/SCOOP PO POWD: 17.0000 g | Freq: Two times a day (BID) | ORAL | 2 refills | Status: DC

## 2020-11-10 NOTE — Progress Notes (Signed)
Patient called asking for information on support groups. She does not wish to go in person, she would like a Haematologist. I provided her with a couple of options, one that she can schedule through [facility] and one with [email or web address]. I told her to call me back if these did not work out for her.

## 2020-11-15 ENCOUNTER — Other Ambulatory Visit: Payer: Self-pay | Admitting: Hematology and Oncology

## 2020-11-15 DIAGNOSIS — G620 Drug-induced polyneuropathy: Secondary | ICD-10-CM

## 2020-12-08 ENCOUNTER — Other Ambulatory Visit: Payer: Self-pay | Admitting: Oncology

## 2020-12-08 DIAGNOSIS — C50012 Malignant neoplasm of nipple and areola, left female breast: Secondary | ICD-10-CM

## 2020-12-13 ENCOUNTER — Other Ambulatory Visit: Payer: Self-pay | Admitting: Surgery

## 2020-12-13 DIAGNOSIS — Z1231 Encounter for screening mammogram for malignant neoplasm of breast: Secondary | ICD-10-CM

## 2020-12-21 ENCOUNTER — Telehealth: Payer: Self-pay

## 2020-12-21 NOTE — Telephone Encounter (Signed)
NOTES ON FILE FROM [facility] [phone] SENT REFERRAL TO SCHEDULING

## 2020-12-28 ENCOUNTER — Encounter: Payer: Self-pay | Admitting: Oncology

## 2020-12-28 ENCOUNTER — Other Ambulatory Visit: Payer: Self-pay

## 2020-12-28 NOTE — Progress Notes (Signed)
[facility]  [address] [place],  [place]  [address] [phone]  Clinic Day:  12/30/2020  Referring physician: Marco Collie, MD  This document serves as a record of services personally performed by Dequincy Macarthur Critchley, MD. It was created on their behalf by [facility] E, a trained medical scribe. The creation of this record is based on the scribe's personal observations and the provider's statements to them.  HISTORY OF PRESENT ILLNESS:  The patient is a 60 y.o. 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  She was placed on weekly paclitaxel/Herceptin for her adjuvant therapy.  However, due to significant peripheral neuropathy, her paclitaxel was discontinued a few weeks before the entire 12 weekly treatments were completed.  She completed 1 year of HER2 therapy in May 2022.  She also completed adjuvant breast radiation.  She takes anastrazole on a daily basis for her adjuvant endocrine therapy.  She comes in today for routine followup.  Since her last visit, the patient has been doing okay.   The range of motion in her left shoulder has improved tremendously since working with physical therapy.  Her neuropathy remains prominent in her hands and feet to where she still relies upon both Lyrica and hydrocodone.  As it pertains to her breast cancer, she denies having any particular breast changes which concern her for disease recurrence.    PHYSICAL EXAM:  Blood pressure 128/84, pulse 78, temperature 98.6 F (37 C), resp. rate 16, height _0  (1.6 m), weight 206 lb 11.2 oz (93.8 kg), SpO2 98 %. Wt Readings from Last 3 Encounters:  12/30/20 206 lb 11.2 oz (93.8 kg)  09/26/20 204 lb 1.9 oz (92.6 kg)  09/05/20 204 lb (92.5 kg)   Body mass index is 36.62 kg/m. Performance status (ECOG): 1 - Symptomatic but completely ambulatory Physical Exam Constitutional:      Appearance: Normal appearance.  HENT:      Mouth/Throat:     Pharynx: Oropharynx is clear. No oropharyngeal exudate.  Cardiovascular:     Rate and Rhythm: Normal rate and regular rhythm.     Heart sounds: No murmur heard.   No friction rub. No gallop.  Pulmonary:     Breath sounds: Normal breath sounds.  Chest:  Breasts:    Right: No swelling, bleeding, inverted nipple, mass, nipple discharge, skin change, axillary adenopathy or supraclavicular adenopathy.     Left: No swelling, bleeding, inverted nipple, mass, nipple discharge, skin change, axillary adenopathy or supraclavicular adenopathy.  Abdominal:     General: Bowel sounds are normal. There is no distension.     Palpations: Abdomen is soft. There is no mass.     Tenderness: There is no abdominal tenderness.  Musculoskeletal:        General: No tenderness.     Cervical back: Normal range of motion and neck supple.     Right lower leg: No edema.     Left lower leg: No edema.  Lymphadenopathy:     Cervical: No cervical adenopathy.     Right cervical: No superficial, deep or posterior cervical adenopathy.    Left cervical: No superficial, deep or posterior cervical adenopathy.     Upper Body:     Right upper body: No supraclavicular or axillary adenopathy.     Left upper body: No supraclavicular or axillary adenopathy.     Lower Body: No right inguinal adenopathy. No left inguinal adenopathy.  Skin:  Coloration: Skin is not jaundiced.     Findings: No lesion or rash.  Neurological:     General: No focal deficit present.     Mental Status: She is alert and oriented to person, place, and time. Mental status is at baseline.  Psychiatric:        Mood and Affect: Mood normal.        Behavior: Behavior normal.        Thought Content: Thought content normal.        Judgment: Judgment normal.   LABS:    Ref. Range 12/30/2020 10:28  Iron Latest Ref Range: 28 - 170 ug/dL 96  UIBC Latest Units: ug/dL 307  TIBC Latest Ref Range: 250 - 450 ug/dL 403  Saturation Ratios  Latest Ref Range: 10.4 - 31.8 % 24  Ferritin Latest Ref Range: 11 - 307 ng/mL 37  Folate Latest Ref Range: >5.9 ng/mL 37.0  Vitamin B12 Latest Ref Range: 180 - 914 pg/mL 634   ASSESSMENT & PLAN:  Assessment/Plan:  A 60 y.o. 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  Based upon her clinical breast exam today, the patient remains disease free.  She knows to continue taking her anastrozole daily for 5 total years of adjuvant endocrine therapy.  With respect to her neuropathy, she knows to continue hydrocodone/APAP to 10/325 every 6 hours, in conjunction with her Lyrica.  From a breast cancer standpoint, the patient is doing well.  I will see her back in 4 months for repeat clinical assessment.  I will also arrange for her to have her port removed.  The patient understands all the plans discussed today and is in agreement with them.     I, Rita Ohara, am acting as scribe for Marice Potter, MD    I have reviewed this report as typed by the medical scribe, and it is complete and accurate.  Dequincy Macarthur Critchley, MD

## 2020-12-29 ENCOUNTER — Other Ambulatory Visit: Payer: Self-pay | Admitting: Hematology and Oncology

## 2020-12-29 DIAGNOSIS — F32A Depression, unspecified: Secondary | ICD-10-CM

## 2020-12-29 MED ORDER — ESCITALOPRAM OXALATE 20 MG PO TABS: 20.0000 mg | ORAL_TABLET | Freq: Every day | ORAL | 2 refills | Status: DC

## 2020-12-30 ENCOUNTER — Other Ambulatory Visit: Payer: Self-pay

## 2020-12-30 ENCOUNTER — Telehealth: Payer: Self-pay | Admitting: Oncology

## 2020-12-30 ENCOUNTER — Inpatient Hospital Stay: Payer: Managed Care, Other (non HMO) | Attending: Oncology | Admitting: Oncology

## 2020-12-30 ENCOUNTER — Inpatient Hospital Stay: Payer: Managed Care, Other (non HMO)

## 2020-12-30 ENCOUNTER — Other Ambulatory Visit: Payer: Self-pay | Admitting: Oncology

## 2020-12-30 VITALS — BP 128/84 | HR 78 | Temp 98.6°F | Resp 16 | Ht 63.0 in | Wt 206.7 lb

## 2020-12-30 DIAGNOSIS — C50012 Malignant neoplasm of nipple and areola, left female breast: Secondary | ICD-10-CM | POA: Diagnosis present

## 2020-12-30 DIAGNOSIS — C50912 Malignant neoplasm of unspecified site of left female breast: Secondary | ICD-10-CM

## 2020-12-30 DIAGNOSIS — Z170 Estrogen receptor positive status [ER+]: Secondary | ICD-10-CM | POA: Insufficient documentation

## 2020-12-30 DIAGNOSIS — Z79811 Long term (current) use of aromatase inhibitors: Secondary | ICD-10-CM | POA: Diagnosis not present

## 2020-12-30 DIAGNOSIS — G620 Drug-induced polyneuropathy: Secondary | ICD-10-CM

## 2020-12-30 LAB — FERRITIN: Ferritin: 37 ng/mL (ref 11–307)

## 2020-12-30 LAB — FOLATE: Folate: 37 ng/mL (ref >5.9)

## 2020-12-30 LAB — VITAMIN B12: Vitamin B-12: 634 pg/mL (ref 180–914)

## 2020-12-30 LAB — IRON AND TIBC
Iron: 96 ug/dL (ref 28–170)
Saturation Ratios: 24 % (ref 10.4–31.8)
TIBC: 403 ug/dL (ref 250–450)
UIBC: 307 ug/dL

## 2020-12-30 MED ORDER — HYDROCODONE-ACETAMINOPHEN 10-325 MG PO TABS: 1.0000 | ORAL_TABLET | Freq: Four times a day (QID) | ORAL | 0 refills | Status: DC | PRN

## 2020-12-30 NOTE — Telephone Encounter (Signed)
Per 7/15 LOS, patient scheduled for Nov Appt's.  Gave patient Appt Summary

## 2021-01-03 ENCOUNTER — Telehealth: Payer: Self-pay

## 2021-01-03 ENCOUNTER — Other Ambulatory Visit: Payer: Self-pay

## 2021-01-03 DIAGNOSIS — F32A Depression, unspecified: Secondary | ICD-10-CM

## 2021-01-03 NOTE — Telephone Encounter (Signed)
Per Dr. Bobby Rumpf, labs from Friday 12/30/2020 are all within normal limits.  Patient notified.

## 2021-01-04 ENCOUNTER — Encounter: Payer: Self-pay | Admitting: Internal Medicine

## 2021-01-04 ENCOUNTER — Encounter: Payer: Self-pay | Admitting: Oncology

## 2021-01-06 ENCOUNTER — Encounter: Payer: Self-pay | Admitting: Oncology

## 2021-01-06 ENCOUNTER — Other Ambulatory Visit: Payer: Self-pay | Admitting: Hematology and Oncology

## 2021-01-06 DIAGNOSIS — K5903 Drug induced constipation: Secondary | ICD-10-CM

## 2021-01-08 ENCOUNTER — Encounter: Payer: Self-pay | Admitting: Oncology

## 2021-01-09 ENCOUNTER — Other Ambulatory Visit: Payer: Self-pay

## 2021-01-09 DIAGNOSIS — K5903 Drug induced constipation: Secondary | ICD-10-CM

## 2021-01-09 MED ORDER — LACTULOSE 10 GM/15ML PO SOLN: ORAL | 2 refills | Status: DC

## 2021-01-17 ENCOUNTER — Encounter: Payer: Self-pay | Admitting: Oncology

## 2021-01-19 DIAGNOSIS — Z452 Encounter for adjustment and management of vascular access device: Secondary | ICD-10-CM | POA: Insufficient documentation

## 2021-02-17 ENCOUNTER — Other Ambulatory Visit: Payer: Self-pay | Admitting: Hematology and Oncology

## 2021-02-17 ENCOUNTER — Other Ambulatory Visit: Payer: Self-pay | Admitting: Ophthalmology

## 2021-02-17 DIAGNOSIS — H05019 Cellulitis of unspecified orbit: Secondary | ICD-10-CM

## 2021-02-17 DIAGNOSIS — T451X5A Adverse effect of antineoplastic and immunosuppressive drugs, initial encounter: Secondary | ICD-10-CM

## 2021-02-17 DIAGNOSIS — G620 Drug-induced polyneuropathy: Secondary | ICD-10-CM

## 2021-02-17 DIAGNOSIS — H5440 Blindness, one eye, unspecified eye: Secondary | ICD-10-CM

## 2021-02-22 ENCOUNTER — Encounter: Payer: Self-pay | Admitting: Oncology

## 2021-03-07 ENCOUNTER — Other Ambulatory Visit: Payer: Self-pay | Admitting: Hematology and Oncology

## 2021-03-07 DIAGNOSIS — G620 Drug-induced polyneuropathy: Secondary | ICD-10-CM

## 2021-03-07 MED ORDER — HYDROCODONE-ACETAMINOPHEN 10-325 MG PO TABS
1.0000 | ORAL_TABLET | Freq: Four times a day (QID) | ORAL | 0 refills | Status: DC | PRN
Start: 2021-03-07 — End: 2021-04-11

## 2021-03-08 ENCOUNTER — Telehealth: Payer: Self-pay | Admitting: General Practice

## 2021-03-08 NOTE — Telephone Encounter (Signed)
[place] CSW Progress Notes  Call from [facility], [place], facilitator of breast cancer support group.  Reports patient directly contacted her during group requesting to talk w her.  Facilitator called her back, patient reports ongoing depression and lack of professional supports that have been a good fit.  Was referred to counselor by "someone" and does not remember name of therapist, is not currently working w therapist.  Had been prescribed antidepressants by physicians, is continuing to experience significant depression.  Facilitator assessed that patient had good support at home, family members aware and engaged, no current SI.  Wanted treatment team aware of situation so appropriate additional resources could be found.  SM sent to oncologist Argentina Donovan MD and spoke/SM to [place].  Per CMA, treatment team is aware of patient situation and working to continue to support patient.    Edwyna Shell, LCSW Clinical Social Worker Phone:  [phone]

## 2021-03-13 ENCOUNTER — Other Ambulatory Visit: Payer: Self-pay | Admitting: Hematology and Oncology

## 2021-03-13 DIAGNOSIS — K5903 Drug induced constipation: Secondary | ICD-10-CM

## 2021-03-14 NOTE — Progress Notes (Signed)
Called to speak with patient regarding a referral for mental health services. She is interested in on-line options, one on one. I did provide her with several options in [place] and [place] that offer virtual visits. She was concerned about her insurance being in-network with these providers. I called and spoke with [employer] who let me know that she would need to call in and obtain this information. They stated that they could also provide her with a list of referrals for mental health services in her area. I sent all of this information to Ruth White in an email. I let her know that I would be calling her to follow-up, I want to make sure she is able to make an appointment.

## 2021-03-20 NOTE — Progress Notes (Signed)
Called to speak with patient, reached her voicemail. Left her a message asking her to call me back.

## 2021-03-21 ENCOUNTER — Other Ambulatory Visit: Payer: Self-pay | Admitting: Hematology and Oncology

## 2021-03-21 DIAGNOSIS — K5903 Drug induced constipation: Secondary | ICD-10-CM

## 2021-03-23 ENCOUNTER — Other Ambulatory Visit: Payer: Self-pay

## 2021-03-23 DIAGNOSIS — K5903 Drug induced constipation: Secondary | ICD-10-CM

## 2021-03-23 MED ORDER — LACTULOSE 10 GM/15ML PO SOLN: ORAL | 2 refills | Status: DC

## 2021-03-24 NOTE — Progress Notes (Signed)
Called to speak with patient, reached her voicemail. Left her a message asking her to call me back.

## 2021-03-27 ENCOUNTER — Encounter: Payer: Self-pay | Admitting: Oncology

## 2021-03-28 ENCOUNTER — Telehealth: Payer: Self-pay

## 2021-03-28 NOTE — Telephone Encounter (Signed)
NOTES SCANNED TO REFERRAL 

## 2021-04-11 ENCOUNTER — Other Ambulatory Visit: Payer: Self-pay | Admitting: Hematology and Oncology

## 2021-04-11 DIAGNOSIS — G620 Drug-induced polyneuropathy: Secondary | ICD-10-CM

## 2021-04-11 MED ORDER — HYDROCODONE-ACETAMINOPHEN 10-325 MG PO TABS
1.0000 | ORAL_TABLET | Freq: Four times a day (QID) | ORAL | 0 refills | Status: DC | PRN
Start: 2021-04-11 — End: 2021-05-25

## 2021-04-24 NOTE — Progress Notes (Signed)
[facility]  [address] [place],  [place]  [address] [phone]  Clinic Day:  05/01/2021  Referring physician: Marco Collie, MD  This document serves as a record of services personally performed by Thoren Hosang Macarthur Critchley, MD. It was created on their behalf by [facility] E, a trained medical scribe. The creation of this record is based on the scribe's personal observations and the provider's statements to them.  HISTORY OF PRESENT ILLNESS:  The patient is a 60 y.o. 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  She was placed on weekly paclitaxel/Herceptin for her adjuvant therapy.  However, due to significant peripheral neuropathy, her paclitaxel was discontinued a few weeks before the entire 12 weekly treatments were completed.  She completed 1 year of HER2 therapy in May 2022.  She also completed adjuvant breast radiation.  She takes anastrozole on a daily basis for her adjuvant endocrine therapy.  She comes in today for routine followup.  Since her last visit, the patient has been doing okay.  Her neuropathy remains prominent in her hands and feet to where she still relies upon both Lyrica and hydrocodone for relief.  Depression remains a major issue which impacts her daily quality of life, which includes her baseline energy level.  As it pertains to her breast cancer, she denies having any particular breast changes which concern her for disease recurrence.    PHYSICAL EXAM:  Blood pressure (!) 172/75, pulse 75, temperature 98.6 F (37 C), resp. rate 14, height _0  (1.6 m), weight 209 lb 12.8 oz (95.2 kg), SpO2 96 %. Wt Readings from Last 3 Encounters:  05/01/21 209 lb 12.8 oz (95.2 kg)  12/30/20 206 lb 11.2 oz (93.8 kg)  09/26/20 204 lb 1.9 oz (92.6 kg)   Body mass index is 37.16 kg/m. Performance status (ECOG): 1 - Symptomatic but completely  ambulatory Physical Exam Constitutional:      Appearance: Normal appearance.  HENT:     Mouth/Throat:     Pharynx: Oropharynx is clear. No oropharyngeal exudate.  Cardiovascular:     Rate and Rhythm: Normal rate and regular rhythm.     Heart sounds: No murmur heard.   No friction rub. No gallop.  Pulmonary:     Breath sounds: Normal breath sounds.  Chest:  Breasts:    Right: No swelling, bleeding, inverted nipple, mass, nipple discharge or skin change.     Left: No swelling, bleeding, inverted nipple, mass, nipple discharge or skin change.  Abdominal:     General: Bowel sounds are normal. There is no distension.     Palpations: Abdomen is soft. There is no mass.     Tenderness: There is no abdominal tenderness.  Musculoskeletal:        General: No tenderness.     Cervical back: Normal range of motion and neck supple.     Right lower leg: No edema.     Left lower leg: No edema.  Lymphadenopathy:     Cervical: No cervical adenopathy.     Right cervical: No superficial, deep or posterior cervical adenopathy.    Left cervical: No superficial, deep or posterior cervical adenopathy.     Upper Body:     Right upper body: No supraclavicular or axillary adenopathy.     Left upper body: No supraclavicular or axillary adenopathy.     Lower Body: No right inguinal adenopathy. No left inguinal  adenopathy.  Skin:    Coloration: Skin is not jaundiced.     Findings: No lesion or rash.  Neurological:     General: No focal deficit present.     Mental Status: She is alert and oriented to person, place, and time. Mental status is at baseline.  Psychiatric:        Mood and Affect: Mood is depressed.        Behavior: Behavior normal.        Thought Content: Thought content normal.        Judgment: Judgment normal.    ASSESSMENT & PLAN:  Assessment/Plan:  A 60 y.o. 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  Based upon her clinical breast  exam today, the patient remains disease free.  She knows to continue taking her anastrozole daily for 5 total years of adjuvant endocrine therapy.  With respect to her neuropathy, she knows to continue hydrocodone/APAP to 10/325 every 6 hours, in conjunction with her Lyrica.  From a breast cancer standpoint, the patient is doing well.  I will see her back in 4 months for repeat clinical assessment.  Her annual mammogram will be scheduled before her next visit for her continued radiographic breast cancer surveillance.  If her next mammogram and breast exam are both normal, I will begin spacing all future visits out to every 6 months.  The patient understands all the plans discussed today and is in agreement with them.     I, Rita Ohara, am acting as scribe for Marice Potter, MD    I have reviewed this report as typed by the medical scribe, and it is complete and accurate.  Cobey Raineri Macarthur Critchley, MD

## 2021-05-01 ENCOUNTER — Telehealth: Payer: Self-pay | Admitting: Oncology

## 2021-05-01 ENCOUNTER — Other Ambulatory Visit: Payer: Self-pay

## 2021-05-01 ENCOUNTER — Inpatient Hospital Stay: Payer: Managed Care, Other (non HMO) | Attending: Oncology | Admitting: Oncology

## 2021-05-01 VITALS — BP 172/75 | HR 75 | Temp 98.6°F | Resp 14 | Ht 63.0 in | Wt 209.8 lb

## 2021-05-01 DIAGNOSIS — C50012 Malignant neoplasm of nipple and areola, left female breast: Secondary | ICD-10-CM | POA: Diagnosis not present

## 2021-05-01 NOTE — Telephone Encounter (Signed)
Per 11/14 los next appt scheduled and given to patient 

## 2021-05-17 DIAGNOSIS — R079 Chest pain, unspecified: Secondary | ICD-10-CM

## 2021-05-25 ENCOUNTER — Other Ambulatory Visit: Payer: Self-pay

## 2021-05-25 ENCOUNTER — Other Ambulatory Visit: Payer: Self-pay | Admitting: Hematology and Oncology

## 2021-05-25 DIAGNOSIS — K5900 Constipation, unspecified: Secondary | ICD-10-CM

## 2021-05-25 DIAGNOSIS — K5903 Drug induced constipation: Secondary | ICD-10-CM

## 2021-05-25 DIAGNOSIS — R110 Nausea: Secondary | ICD-10-CM

## 2021-05-25 DIAGNOSIS — G620 Drug-induced polyneuropathy: Secondary | ICD-10-CM

## 2021-05-25 MED ORDER — HYDROCODONE-ACETAMINOPHEN 10-325 MG PO TABS
1.0000 | ORAL_TABLET | Freq: Four times a day (QID) | ORAL | 0 refills | Status: DC | PRN
Start: 2021-05-25 — End: 2021-07-04

## 2021-05-25 MED ORDER — PROCHLORPERAZINE MALEATE 10 MG PO TABS: 10.0000 mg | ORAL_TABLET | Freq: Four times a day (QID) | ORAL | 1 refills | Status: DC | PRN

## 2021-05-26 ENCOUNTER — Other Ambulatory Visit: Payer: Self-pay | Admitting: Hematology and Oncology

## 2021-05-26 DIAGNOSIS — G620 Drug-induced polyneuropathy: Secondary | ICD-10-CM

## 2021-05-26 DIAGNOSIS — T451X5A Adverse effect of antineoplastic and immunosuppressive drugs, initial encounter: Secondary | ICD-10-CM

## 2021-07-04 ENCOUNTER — Other Ambulatory Visit: Payer: Self-pay

## 2021-07-04 DIAGNOSIS — T451X5A Adverse effect of antineoplastic and immunosuppressive drugs, initial encounter: Secondary | ICD-10-CM

## 2021-07-04 DIAGNOSIS — G620 Drug-induced polyneuropathy: Secondary | ICD-10-CM

## 2021-07-04 MED ORDER — HYDROCODONE-ACETAMINOPHEN 10-325 MG PO TABS: 1.0000 | ORAL_TABLET | Freq: Four times a day (QID) | ORAL | 0 refills | Status: DC | PRN

## 2021-07-10 ENCOUNTER — Ambulatory Visit (INDEPENDENT_AMBULATORY_CARE_PROVIDER_SITE_OTHER): Payer: Managed Care, Other (non HMO) | Admitting: Pulmonary Disease

## 2021-07-10 ENCOUNTER — Ambulatory Visit (INDEPENDENT_AMBULATORY_CARE_PROVIDER_SITE_OTHER): Payer: Managed Care, Other (non HMO)

## 2021-07-10 ENCOUNTER — Other Ambulatory Visit: Payer: Self-pay

## 2021-07-10 ENCOUNTER — Encounter: Payer: Self-pay | Admitting: Pulmonary Disease

## 2021-07-10 VITALS — BP 124/90 | HR 81 | Temp 98.3°F | Ht 64.0 in | Wt 208.4 lb

## 2021-07-10 DIAGNOSIS — R0602 Shortness of breath: Secondary | ICD-10-CM | POA: Diagnosis not present

## 2021-07-10 NOTE — Patient Instructions (Addendum)
Shortness of breath Hx of chemotherapy/radiation --CONTINUE Breztri TWO puffs TWICE a day --ORDER CXR --ARRANGE for pulmonary function tests --REQUEST records from Dr. Pamella Pert with spirometry, CXR  Follow-up with me after PFTs

## 2021-07-10 NOTE — Progress Notes (Signed)
Subjective:   PATIENT ID: Ruth White GENDER: female DOB: 1961/02/20, MRN: [id number]   HPI  Chief Complaint  Patient presents with   Consult    DOE   Reason for Visit: New consult for shortness of breath  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 Husband is present.  She is referred by her PCP. Note reviewed from 06/14/21. She reports shortness of breath after completing chemotherapy in August/September. Associated with coughing. Occasional wheezing. No chest congestion. Occurs with any exertion including with walking, bathing and talking. Bending down is difficult for her due to dizziness. She had a negative CXR in the fall and normal SpO2. She has tried on Trelegy for one week but unable to take due to feeling choked up. She was switched to [facility] and has been compliant for the last month. She reports partial improvement. Before her cancer treatment two years ago she is extremely active and functional.  Social History: Never smoker Childhood exposure to woodburner when at her grandmothers  Environmental exposures: Chemotherapy, radiation  I have personally reviewed patient's past medical/family/social history, allergies, current medications.  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)      History reviewed. No pertinent family history.   Social History   Occupational History   Not on file  Tobacco Use   Smoking status: Never   Smokeless tobacco: Never  Substance and Sexual Activity   Alcohol use: Not on file   Drug use: Not on file   Sexual activity: Not on file    No Known Allergies   Outpatient Medications  Prior to Visit  Medication Sig Dispense Refill   anastrozole (ARIMIDEX) 1 MG tablet TAKE 1 TABLET BY MOUTH EVERY DAY TO PREVENT FUTURE DISEASE RECURRENCE 90 tablet 3   aspirin 325 MG EC tablet Take 325 mg by mouth daily.     atenolol (TENORMIN) 50 MG tablet Take 50 mg by mouth daily.     BREZTRI AEROSPHERE 160-9-4.8 MCG/ACT AERO      ezetimibe (ZETIA) 10 MG tablet Take 10 mg by mouth daily.     hydrochlorothiazide (HYDRODIURIL) 12.5 MG tablet Take 12.5 mg by mouth daily.     HYDROcodone-acetaminophen (NORCO) 10-325 MG tablet Take 1 tablet by mouth every 6 (six) hours as needed for severe pain. 40 tablet 0   lactulose (CHRONULAC) 10 GM/15ML solution ON FIRST DAY, TAKE 15ML BY MOUTH EVERY HOUR FOR 3 DOSES. IF NO BM AFTER 3RD DOSE PT TO ADMINISTER A FLEETS ENEMA. REPEAT THIS CYCLE X 1 IF NEEDED. DAILY MAINTENANCE DOSING WILL BE 15ML DAILY 236 mL 2   liraglutide (VICTOZA) 18 MG/3ML SOPN Inject into the skin.     lisinopril (ZESTRIL) 40 MG tablet Take 40 mg by mouth daily.     metFORMIN (GLUCOPHAGE) 500 MG tablet Take by mouth 2 (two) times daily with a meal.     ondansetron (ZOFRAN) 4 MG tablet Take 4 mg by mouth every 8 (eight) hours as needed for nausea or vomiting.     pregabalin (LYRICA) 75 MG capsule TAKE 1 CAPSULE BY MOUTH TWICE A DAY 180 capsule 0   prochlorperazine (COMPAZINE) 10 MG tablet Take 1  tablet (10 mg total) by mouth every 6 (six) hours as needed for nausea or vomiting. 30 tablet 1   rosuvastatin (CRESTOR) 40 MG tablet Take 40 mg by mouth daily.     SENEXON-S 8.6-50 MG tablet TAKE 2 TABLETS BY MOUTH TWICE A DAY 120 tablet 2   chlorhexidine (PERIDEX) 0.12 % solution      [employer] PURELAX 17 GM/SCOOP powder Take 17 g by mouth in the morning and at bedtime. 255 g 2   dexamethasone (DECADRON) 4 MG tablet Take 4 mg by mouth 2 (two) times daily with a meal.     diclofenac Sodium (VOLTAREN) 1 % GEL Apply topically.     escitalopram (LEXAPRO) 20 MG tablet Take 1 tablet (20 mg total) by mouth  daily. 90 tablet 2   ibuprofen (ADVIL) 800 MG tablet Take 800 mg by mouth every 8 (eight) hours as needed. (Patient not taking: Reported on 07/10/2021)     lidocaine-prilocaine (EMLA) cream Apply 1 application topically as needed. (Patient not taking: Reported on 07/10/2021)     magic mouthwash SOLN Take 5 mLs by mouth. (Patient not taking: Reported on 07/10/2021)     ondansetron (ZOFRAN-ODT) 4 MG disintegrating tablet Take 4 mg by mouth every 8 (eight) hours as needed. (Patient not taking: Reported on 07/10/2021)     vitamin C (ASCORBIC ACID) 250 MG tablet Take 500 mg by mouth daily. (Patient not taking: Reported on 07/10/2021)     No facility-administered medications prior to visit.    Review of Systems  Constitutional:  Negative for chills, diaphoresis, fever, malaise/fatigue and weight loss.  HENT:  Negative for congestion, ear pain and sore throat.   Respiratory:  Positive for shortness of breath. Negative for cough, hemoptysis, sputum production and wheezing.   Cardiovascular:  Positive for chest pain and palpitations. Negative for leg swelling.  Gastrointestinal:  Negative for abdominal pain, heartburn and nausea.  Genitourinary:  Negative for frequency.  Musculoskeletal:  Positive for myalgias. Negative for joint pain.  Skin:  Negative for itching and rash.  Neurological:  Positive for headaches. Negative for dizziness and weakness.  Endo/Heme/Allergies:  Does not bruise/bleed easily.  Psychiatric/Behavioral:  Positive for depression. The patient is nervous/anxious.     Objective:   Vitals:   07/10/21 1030  BP: 124/90  Pulse: 81  Temp: 98.3 F (36.8 C)  TempSrc: Oral  SpO2: 99%  Weight: 208 lb 6.4 oz (94.5 kg)  Height: 5\' 4"  (1.626 m)   SpO2: 99 % O2 Device: None (Room air)  Physical Exam: General: Well-appearing, no acute distress HENT: [place], AT Eyes: EOMI, no scleral icterus Respiratory: Clear to auscultation bilaterally.  No crackles, wheezing or rales Cardiovascular:  RRR, -M/R/G, no JVD Extremities:-Edema,-tenderness Neuro: AAO x4, CNII-XII grossly intact Psych: Normal mood, normal affect  Data Reviewed:  Imaging: No chest imaging on file  PFT: None on file  Labs: BMET from 12/30/2020 reviewed.  No abnormalities.  Normal electrolytes and renal function     Assessment & Plan:   Discussion: 61 year old female with history of multifocal stage Ia breast cancer status post lumpectomy 07/2019 and radiation on adjuvant therapy, peripheral neuropathy, depression who presents as a new consult for shortness of breath. Reviewed history and based on timeline, concerned for undiagnosed restrictive defect related to radiation. We also discussed how severe deconditioning can result in dyspnea as well.  Shortness of breath Hx of chemotherapy/radiation --CONTINUE Breztri TWO puffs TWICE a day --ORDER CXR --ARRANGE for pulmonary function tests --REQUEST records from Dr.  [place] with spirometry, CXR  Health Maintenance  There is no immunization history on file for this patient. CT Lung Screen - never smoker. Not qualified.  Orders Placed This Encounter  Procedures   DG Chest 2 View    Standing Status:   Future    Number of Occurrences:   1    Standing Expiration Date:   07/10/2022    Order Specific Question:   Reason for Exam (SYMPTOM  OR DIAGNOSIS REQUIRED)    Answer:   shortness of breath    Order Specific Question:   Preferred imaging location?    Answer:   Internal    Order Specific Question:   Is patient pregnant?    Answer:   No   Pulmonary function test    Standing Status:   Future    Standing Expiration Date:   07/10/2022    Order Specific Question:   Where should this test be performed?    Answer:   [facility]    Order Specific Question:   Full PFT: includes the following: basic spirometry, spirometry pre & post bronchodilator, diffusion capacity (DLCO), lung volumes    Answer:   Full PFT  No orders of the defined types were placed  in this encounter.   Return in about 6 weeks (around 08/22/2021).  I have spent a total time of 45-minutes on the day of the appointment reviewing prior documentation, coordinating care and discussing medical diagnosis and plan with the patient/family. Imaging, labs and tests included in this note have been reviewed and interpreted independently by me.  [place], MD [facility] 07/10/2021 11:57 AM  Office Number [phone]

## 2021-07-16 ENCOUNTER — Other Ambulatory Visit: Payer: Self-pay | Admitting: Hematology and Oncology

## 2021-07-16 DIAGNOSIS — K5903 Drug induced constipation: Secondary | ICD-10-CM

## 2021-07-17 ENCOUNTER — Encounter: Payer: Self-pay | Admitting: Oncology

## 2021-07-17 MED ORDER — LACTULOSE ENCEPHALOPATHY 10 GM/15ML PO SOLN: 10.0000 g | Freq: Every day | ORAL | 2 refills | Status: DC

## 2021-07-20 ENCOUNTER — Other Ambulatory Visit: Payer: Self-pay | Admitting: Hematology and Oncology

## 2021-07-20 DIAGNOSIS — K5903 Drug induced constipation: Secondary | ICD-10-CM

## 2021-07-20 MED ORDER — LACTULOSE ENCEPHALOPATHY 10 GM/15ML PO SOLN: 10.0000 g | Freq: Every day | ORAL | 2 refills | Status: DC

## 2021-07-31 ENCOUNTER — Other Ambulatory Visit: Payer: Self-pay | Admitting: Hematology and Oncology

## 2021-07-31 DIAGNOSIS — G620 Drug-induced polyneuropathy: Secondary | ICD-10-CM

## 2021-07-31 DIAGNOSIS — T451X5A Adverse effect of antineoplastic and immunosuppressive drugs, initial encounter: Secondary | ICD-10-CM

## 2021-07-31 MED ORDER — HYDROCODONE-ACETAMINOPHEN 10-325 MG PO TABS: 1.0000 | ORAL_TABLET | Freq: Four times a day (QID) | ORAL | 0 refills | Status: DC | PRN

## 2021-08-14 ENCOUNTER — Other Ambulatory Visit: Payer: Self-pay | Admitting: Surgery

## 2021-08-14 ENCOUNTER — Ambulatory Visit
Admission: RE | Admit: 2021-08-14 | Discharge: 2021-08-14 | Disposition: A | Discharge status: Home / Self Care | Payer: Managed Care, Other (non HMO) | Source: Ambulatory Visit | Attending: Surgery | Admitting: Surgery

## 2021-08-14 DIAGNOSIS — Z853 Personal history of malignant neoplasm of breast: Secondary | ICD-10-CM

## 2021-08-14 DIAGNOSIS — Z1231 Encounter for screening mammogram for malignant neoplasm of breast: Secondary | ICD-10-CM

## 2021-08-14 HISTORY — DX: Personal history of antineoplastic chemotherapy: Z92.21

## 2021-08-14 HISTORY — DX: Personal history of irradiation: Z92.3

## 2021-08-22 ENCOUNTER — Ambulatory Visit (INDEPENDENT_AMBULATORY_CARE_PROVIDER_SITE_OTHER): Payer: Managed Care, Other (non HMO) | Admitting: Pulmonary Disease

## 2021-08-22 ENCOUNTER — Encounter: Payer: Self-pay | Admitting: Pulmonary Disease

## 2021-08-22 ENCOUNTER — Other Ambulatory Visit: Payer: Self-pay

## 2021-08-22 VITALS — BP 138/80 | HR 88 | Ht 64.0 in | Wt 205.0 lb

## 2021-08-22 DIAGNOSIS — R0602 Shortness of breath: Secondary | ICD-10-CM | POA: Diagnosis not present

## 2021-08-22 DIAGNOSIS — J449 Chronic obstructive pulmonary disease, unspecified: Secondary | ICD-10-CM

## 2021-08-22 DIAGNOSIS — J9611 Chronic respiratory failure with hypoxia: Secondary | ICD-10-CM | POA: Diagnosis not present

## 2021-08-22 LAB — PULMONARY FUNCTION TEST
FEF 25-75 Post: 0.45 L/sec
FEF 25-75 Pre: 0.49 L/sec
FEF2575-%Change-Post: -8 %
FEF2575-%Pred-Post: 21 %
FEF2575-%Pred-Pre: 23 %
FEV1-%Change-Post: 5 %
FEV1-%Pred-Post: 31 %
FEV1-%Pred-Pre: 30 %
FEV1-Post: 0.67 L
FEV1-Pre: 0.64 L
FEV1FVC-%Change-Post: -5 %
FEV1FVC-%Pred-Pre: 84 %
FEV6-%Change-Post: 22 %
FEV6-%Pred-Post: 40 %
FEV6-%Pred-Pre: 33 %
FEV6-Post: 1.05 L
FEV6-Pre: 0.86 L
FEV6FVC-%Pred-Post: 103 %
FEV6FVC-%Pred-Pre: 103 %
FVC-%Change-Post: 10 %
FVC-%Pred-Post: 39 %
FVC-%Pred-Pre: 35 %
FVC-Post: 1.05 L
FVC-Pre: 0.95 L
Post FEV1/FVC ratio: 64 %
Post FEV6/FVC ratio: 100 %
Pre FEV1/FVC ratio: 67 %
Pre FEV6/FVC Ratio: 100 %

## 2021-08-22 MED ORDER — ALBUTEROL SULFATE HFA 108 (90 BASE) MCG/ACT IN AERS: 2.0000 | INHALATION_SPRAY | Freq: Four times a day (QID) | RESPIRATORY_TRACT | 2 refills | Status: DC | PRN

## 2021-08-22 NOTE — Patient Instructions (Signed)
Spirometry pre/post performed today. 

## 2021-08-22 NOTE — Progress Notes (Incomplete)
?[facility]  ?[address] ?[place],  [place]  [address] ?[phone] ? ?Clinic Day:  08/22/2021 ? ?Referring physician: Marco Collie, MD ? ?This document serves as a record of services personally performed by Marice Potter, MD. It was created on their behalf by Curry,Lauren E, a trained medical scribe. The creation of this record is based on the scribe's personal observations and the provider's statements to them. ? ?HISTORY OF PRESENT ILLNESS:  ?The patient is a 61 y.o. 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  She was placed on weekly paclitaxel/Herceptin for her adjuvant therapy.  However, due to significant peripheral neuropathy, her paclitaxel was discontinued a few weeks before the entire 12 weekly treatments were completed.  She completed 1 year of HER2 therapy in May 2022.  She also completed adjuvant breast radiation.  She takes anastrozole on a daily basis for her adjuvant endocrine therapy.  She comes in today for routine followup.  Since her last visit, the patient has been doing okay.  Her neuropathy remains prominent in her hands and feet to where she still relies upon both Lyrica and hydrocodone for relief.  Depression remains a major issue which impacts her daily quality of life, which includes her baseline energy level.  As it pertains to her breast cancer, she denies having any particular breast changes which concern her for disease recurrence.   ? ?PHYSICAL EXAM:  ?Last menstrual period 08/12/2020. ?Wt Readings from Last 3 Encounters:  ?08/22/21 205 lb (93 kg)  ?07/10/21 208 lb 6.4 oz (94.5 kg)  ?05/01/21 209 lb 12.8 oz (95.2 kg)  ? ?There is no height or weight on file to calculate BMI. ?Performance status (ECOG): 1 - Symptomatic but completely ambulatory ?Physical Exam ?Constitutional:   ?   Appearance: Normal appearance.  ?HENT:  ?   Mouth/Throat:  ?   Pharynx:  Oropharynx is clear. No oropharyngeal exudate.  ?Cardiovascular:  ?   Rate and Rhythm: Normal rate and regular rhythm.  ?   Heart sounds: No murmur heard. ?  No friction rub. No gallop.  ?Pulmonary:  ?   Breath sounds: Normal breath sounds.  ?Chest:  ?Breasts: ?   Right: No swelling, bleeding, inverted nipple, mass, nipple discharge or skin change.  ?   Left: No swelling, bleeding, inverted nipple, mass, nipple discharge or skin change.  ?Abdominal:  ?   General: Bowel sounds are normal. There is no distension.  ?   Palpations: Abdomen is soft. There is no mass.  ?   Tenderness: There is no abdominal tenderness.  ?Musculoskeletal:     ?   General: No tenderness.  ?   Cervical back: Normal range of motion and neck supple.  ?   Right lower leg: No edema.  ?   Left lower leg: No edema.  ?Lymphadenopathy:  ?   Cervical: No cervical adenopathy.  ?   Right cervical: No superficial, deep or posterior cervical adenopathy. ?   Left cervical: No superficial, deep or posterior cervical adenopathy.  ?   Upper Body:  ?   Right upper body: No supraclavicular or axillary adenopathy.  ?   Left upper body: No supraclavicular or axillary adenopathy.  ?   Lower Body: No right inguinal adenopathy. No left inguinal adenopathy.  ?Skin: ?   Coloration: Skin is not jaundiced.  ?   Findings: No lesion or rash.  ?Neurological:  ?  General: No focal deficit present.  ?   Mental Status: She is alert and oriented to person, place, and time. Mental status is at baseline.  ?Psychiatric:     ?   Mood and Affect: Mood is depressed.     ?   Behavior: Behavior normal.     ?   Thought Content: Thought content normal.     ?   Judgment: Judgment normal.  ? ? ?ASSESSMENT & PLAN:  ?Assessment/Plan:  A 61 y.o. 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  Based upon her clinical breast exam today, the patient remains disease free.  She knows to continue taking her anastrozole daily for 5 total years of  adjuvant endocrine therapy.  With respect to her neuropathy, she knows to continue hydrocodone/APAP to 10/325 every 6 hours, in conjunction with her Lyrica.  From a breast cancer standpoint, the patient is doing well.  I will see her back in 4 months for repeat clinical assessment.  Her annual mammogram will be scheduled before her next visit for her continued radiographic breast cancer surveillance.  If her next mammogram and breast exam are both normal, I will begin spacing all future visits out to every 6 months.  The patient understands all the plans discussed today and is in agreement with them.   ? ? ?I, Rita Ohara, am acting as scribe for Marice Potter, MD   ? ?I have reviewed this report as typed by the medical scribe, and it is complete and accurate. ? ?Dequincy Macarthur Critchley, MD ? ? ? ?  ?

## 2021-08-22 NOTE — Patient Instructions (Addendum)
Severe COPD (non-smoker) ?Hx of chemotherapy/radiation ?--CONTINUE Breztri TWO puffs TWICE a day ?--START Albuterol TWO puffs AS NEEDED for shortness of breath or wheezing ?--ORDER CT Chest without contrast ?--REFER to Pulmonary Rehab ?--RE-REQUEST records from Dr. Pamella Pert with spirometry, CXR ? ?Chronic hypoxemic respiratory failure ?--Wear oxygen 2L via nasal cannula with activity and sleep ?--Will walk for POC ? ?Follow-up with me in 3 months ?

## 2021-08-22 NOTE — Progress Notes (Signed)
? ? ?Subjective:  ? ?PATIENT ID: [name] GENDER: female DOB: February 11, 1961, MRN: [id number] ? ? ?HPI ? ?Chief Complaint  ?Patient presents with  ? Follow-up  ?  PFT review ?sob  ? ?Reason for Visit: Follow-up shortness of breath, PFTs ? ?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  ? ?Initial Consult 07/10/21 ?She is referred by her PCP. Note reviewed from 06/14/21. She reports shortness of breath after completing chemotherapy in August/September. Associated with coughing. Occasional wheezing. No chest congestion. Occurs with any exertion including with walking, bathing and talking. Bending down is difficult for her due to dizziness. She had a negative CXR in the fall and normal SpO2. She has tried on Trelegy for one week but unable to take due to feeling choked up. She was switched to [facility] and has been compliant for the last month. She reports partial improvement. Before her cancer treatment two years ago she is extremely active and functional. ? ?08/22/21 ?She presented for PFT evaluation. Since our last visit she has been taking Breztri with partial improvement ~30%. Her shortness of breath and coughing has improved. Occasional wheezing. She is walking two laps around the yard. Husband is present and reports she is more active. ? ?Prior inhalers ?Trelegy - Didn't tolerate due to nausea ? ?Social History: ?Never smoker ?Childhood exposure to woodburner when at her grandmothers ? ?Environmental exposures: Chemotherapy, radiation ? ?Past Medical History:  ?Diagnosis Date  ? 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 Malignant neoplasm of nipple or areola of female breast, left ([place])   ? Personal history of chemotherapy   ? Personal history of radiation therapy   ?  ? ?No  Known Allergies  ? ?Outpatient Medications Prior to Visit  ?Medication Sig Dispense Refill  ? anastrozole (ARIMIDEX) 1 MG tablet TAKE 1 TABLET BY MOUTH EVERY DAY TO PREVENT FUTURE DISEASE RECURRENCE 90 tablet 3  ? aspirin 325 MG EC tablet Take 325 mg by mouth daily.    ? atenolol (TENORMIN) 50 MG tablet Take 50 mg by mouth daily.    ? BREZTRI AEROSPHERE 160-9-4.8 MCG/ACT AERO     ? chlorhexidine (PERIDEX) 0.12 % solution     ? [employer] PURELAX 17 GM/SCOOP powder Take 17 g by mouth in the morning and at bedtime. 255 g 2  ? dexamethasone (DECADRON) 4 MG tablet Take 4 mg by mouth 2 (two) times daily with a meal.    ? diclofenac Sodium (VOLTAREN) 1 % GEL Apply topically.    ? escitalopram (LEXAPRO) 20 MG tablet Take 1 tablet (20 mg total) by mouth daily. 90 tablet 2  ? ezetimibe (ZETIA) 10 MG tablet Take 10 mg by mouth daily.    ? hydrochlorothiazide (HYDRODIURIL) 12.5 MG tablet Take 12.5 mg by mouth daily.    ? HYDROcodone-acetaminophen (NORCO) 10-325 MG tablet Take 1 tablet by mouth every 6 (six) hours as needed for severe pain. 40 tablet 0  ? ibuprofen (ADVIL) 800 MG tablet Take 800 mg by mouth every 8 (eight) hours as needed.    ? lactulose (CHRONULAC) 10 GM/15ML solution ON FIRST DAY, TAKE 15ML BY MOUTH EVERY HOUR FOR 3 DOSES. IF NO BM AFTER 3RD DOSE PT TO ADMINISTER A FLEETS ENEMA. REPEAT THIS CYCLE X 1 IF NEEDED. DAILY MAINTENANCE DOSING WILL  BE 15ML DAILY 236 mL 2  ? lactulose, encephalopathy, (CHRONULAC) 10 GM/15ML SOLN Take 15 mLs (10 g total) by mouth daily. 473 mL 2  ? lidocaine-prilocaine (EMLA) cream Apply 1 application. topically as needed.    ? liraglutide (VICTOZA) 18 MG/3ML SOPN Inject into the skin.    ? lisinopril (ZESTRIL) 40 MG tablet Take 40 mg by mouth daily.    ? magic mouthwash SOLN Take 5 mLs by mouth.    ? metFORMIN (GLUCOPHAGE) 500 MG tablet Take by mouth 2 (two) times daily with a meal.    ? ondansetron (ZOFRAN) 4 MG tablet Take 4 mg by mouth every 8 (eight) hours as needed for nausea or  vomiting.    ? ondansetron (ZOFRAN-ODT) 4 MG disintegrating tablet Take 4 mg by mouth every 8 (eight) hours as needed.    ? pregabalin (LYRICA) 75 MG capsule TAKE 1 CAPSULE BY MOUTH TWICE A DAY 180 capsule 0  ? prochlorperazine (COMPAZINE) 10 MG tablet Take 1 tablet (10 mg total) by mouth every 6 (six) hours as needed for nausea or vomiting. 30 tablet 1  ? rosuvastatin (CRESTOR) 40 MG tablet Take 40 mg by mouth daily.    ? SENEXON-S 8.6-50 MG tablet TAKE 2 TABLETS BY MOUTH TWICE A DAY 120 tablet 2  ? vitamin C (ASCORBIC ACID) 250 MG tablet Take 500 mg by mouth daily.    ? ?No facility-administered medications prior to visit.  ? ? ?Review of Systems  ?Constitutional:  Negative for chills, diaphoresis, fever, malaise/fatigue and weight loss.  ?HENT:  Negative for congestion.   ?Respiratory:  Positive for cough, shortness of breath and wheezing. Negative for hemoptysis and sputum production.   ?Cardiovascular:  Negative for chest pain, palpitations and leg swelling.  ? ? ?Objective:  ? ?Vitals:  ? 08/22/21 1019  ?BP: 138/80  ?Pulse: 88  ?SpO2: 96%  ?Weight: 205 lb (93 kg)  ?Height: '5\' 4"'$  (1.626 m)  ? ?SpO2: 96 % ?O2 Device: None (Room air) ? ?Physical Exam: ?General: Well-appearing, no acute distress ?HENT: [place], AT ?Eyes: EOMI, no scleral icterus ?Respiratory: Clear to auscultation bilaterally.  No crackles, wheezing or rales ?Cardiovascular: RRR, -M/R/G, no JVD ?Extremities:-Edema,-tenderness ?Neuro: AAO x4, CNII-XII grossly intact ?Psych: Normal mood, normal affect ? ?Data Reviewed: ? ?Imaging: ?CXR 07/10/2021- No infiltrate, effusion or edema. Low lung volumes. ? ?PFT: ?08/22/21  ?FVC 1.05 (39%) FEV1 0.67 (31%) Ratio 67   ?Unable to complete TLC or DLCO due to effort ?Interpretation: Very severe obstructive defect. No significant bronchodilator response ? ?Labs: ?BMET from 12/30/2020 reviewed.  No abnormalities.  Normal electrolytes and renal function ? ?Ambulatory O2 08/22/21 ?Desaturation ? ?Assessment & Plan:   ? ?Discussion: ?61 year old female with history of multifocal stage Ia breast cancer status postlumpectomy 07/2019 and radiation on adjuvant therapy, peripheral neuropathy, depression who presents for follow-up for shortness of breath.  We have reviewed PFTs with obstructive defect. Unable to obtain TLC or DLCO. Bronchodilators have provided improvement. I suspect deconditioning is also contributing to her dyspnea as well which she is working on with more activity. ? ?Severe COPD (non-smoker) ?Hx of chemotherapy/radiation ?--CONTINUE Breztri TWO puffs TWICE a day ?--START Albuterol TWO puffs AS NEEDED for shortness of breath or wheezing ?--ORDER CT Chest without contrast to evaluate for parenchymal abnormalities ?--REFER to Pulmonary Rehab ?--RE-REQUEST records from Dr. Pamella Pert with spirometry, CXR ? ?Health Maintenance ? ?There is no immunization history on file for this patient. ?CT Lung Screen - never smoker. Not qualified. ? ?Orders  Placed This Encounter  ?Procedures  ? AMB referral to pulmonary rehabilitation  ?  Referral Priority:   Routine  ?  Referral Type:   Consultation  ?  Number of Visits Requested:   1  ? ?Meds ordered this encounter  ?Medications  ? albuterol (VENTOLIN HFA) 108 (90 Base) MCG/ACT inhaler  ?  Sig: Inhale 2 puffs into the lungs every 6 (six) hours as needed for wheezing or shortness of breath.  ?  Dispense:  8 g  ?  Refill:  2  ? ? ?Return in about 3 months (around 11/22/2021). ? ?I have spent a total time of 32-minutes on the day of the appointment reviewing prior documentation, coordinating care and discussing medical diagnosis and plan with the patient/family. Past medical history, allergies, medications were reviewed. Pertinent imaging, labs and tests included in this note have been reviewed and interpreted independently by me. ? ?[place] Welliver Rodman Pickle, MD ?[facility] ?08/22/2021 10:56 AM  ?Office Number [phone] ? ? ?

## 2021-08-22 NOTE — Progress Notes (Signed)
Spirometry pre and post performed today. Patient was unable to perform DLCO/PLETH today. ?

## 2021-08-23 ENCOUNTER — Encounter: Payer: Self-pay | Admitting: Pulmonary Disease

## 2021-08-23 ENCOUNTER — Telehealth: Payer: Self-pay | Admitting: Pulmonary Disease

## 2021-08-23 NOTE — Telephone Encounter (Signed)
I called and left a message for Ruth White to call back.  ?

## 2021-08-28 ENCOUNTER — Other Ambulatory Visit: Payer: Self-pay

## 2021-08-28 ENCOUNTER — Other Ambulatory Visit: Payer: Self-pay | Admitting: Hematology and Oncology

## 2021-08-28 DIAGNOSIS — T451X5A Adverse effect of antineoplastic and immunosuppressive drugs, initial encounter: Secondary | ICD-10-CM

## 2021-08-28 DIAGNOSIS — G620 Drug-induced polyneuropathy: Secondary | ICD-10-CM

## 2021-08-28 MED ORDER — HYDROCODONE-ACETAMINOPHEN 10-325 MG PO TABS: 1.0000 | ORAL_TABLET | Freq: Four times a day (QID) | ORAL | 0 refills | Status: DC | PRN

## 2021-08-29 ENCOUNTER — Ambulatory Visit: Payer: Managed Care, Other (non HMO) | Admitting: Oncology

## 2021-08-30 NOTE — Telephone Encounter (Signed)
Harrell Gave husband checking on order for oxygen. [employer] phone number is [phone]. Harrell Gave phone number is [phone]. ?

## 2021-08-30 NOTE — Telephone Encounter (Signed)
Waiting for phone call from [place] to verify order for oxygen. ?

## 2021-08-31 ENCOUNTER — Encounter (HOSPITAL_COMMUNITY): Payer: Self-pay | Admitting: *Deleted

## 2021-08-31 NOTE — Telephone Encounter (Signed)
It looks like this order was signed by Dr Loanne Drilling already. I do not see it in her basket under pending cosign orders. PCC's can you please confirm this so we can close this encounter? Thanks! ?

## 2021-08-31 NOTE — Telephone Encounter (Signed)
I just spoke to Ruth White and she said that everything is taken care of. ?

## 2021-08-31 NOTE — Progress Notes (Signed)
Received from [facility] Pulmonary rehab department this referral from Dr. Loanne Drilling for this pt to participate in pulmonary rehab with the diagnosis of COPD Stage 3.  Pt completed full PFT on 3/7 which showed FEV1/FVC 64; FEV1 post BD 31.Clinical review of pt follow up appt on 3/7 Pulmonary office note.  Pt with Covid Risk Score - 3. Pt appropriate for scheduling for Pulmonary rehab.  Will forward to support staff for scheduling and verification of insurance eligibility/benefits with pt consent. Cherre Huger, BSN ?Cardiac and Pulmonary Rehab Nurse Navigator  ? ?

## 2021-09-10 NOTE — Progress Notes (Signed)
?[facility]  ?[address] ?[place],  [place]  [address] ?[phone] ? ?Clinic Day:  09/11/2021 ? ?Referring physician: Marco Collie, MD ? ?HISTORY OF PRESENT ILLNESS:  ?The patient is a 61 y.o. 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  She was placed on weekly paclitaxel/Herceptin for her adjuvant therapy.  However, due to significant peripheral neuropathy, her paclitaxel was discontinued a few weeks before the entire 12 weekly treatments were completed.  She completed 1 year of HER2 therapy in May 2022.  She also completed adjuvant breast radiation.  She takes anastrozole on a daily basis for her adjuvant endocrine therapy.  She comes in today for routine followup.  Since her last visit, the patient has been doing okay.  Her neuropathy remains prominent in her hands and feet to where she still relies upon both Lyrica and hydrocodone for relief.  She also has been recently diagnosed with lung problems; her husband claims she was given the diagnosis of COPD, for which she is now on 2L of oxygen.  Depression remains a major issue which impacts her daily quality of life, which includes her baseline energy level.  As it pertains to her breast cancer, she denies having any particular breast changes which concern her for disease recurrence.  Of note, her annual mammogram in February 2023 continued to show no evidence of disease recurrence ? ?PHYSICAL EXAM:  ?Blood pressure (!) 142/87, pulse 76, temperature 99.1 ?F (37.3 ?C), resp. rate 16, height '5\' 4"'  (1.626 m), weight 205 lb 1.6 oz (93 kg), last menstrual period 08/12/2020, SpO2 97 %. ?Wt Readings from Last 3 Encounters:  ?09/11/21 205 lb 1.6 oz (93 kg)  ?08/22/21 205 lb (93 kg)  ?07/10/21 208 lb 6.4 oz (94.5 kg)  ? ?Body mass index is 35.21 kg/m?Marland Kitchen ?Performance status (ECOG): 1 - Symptomatic but completely ambulatory ?Physical  Exam ?Constitutional:   ?   Appearance: Normal appearance.  ?   Comments: She is now wearing oxygen per nasal canula  ?HENT:  ?   Mouth/Throat:  ?   Pharynx: Oropharynx is clear. No oropharyngeal exudate.  ?Cardiovascular:  ?   Rate and Rhythm: Normal rate and regular rhythm.  ?   Heart sounds: No murmur heard. ?  No friction rub. No gallop.  ?Pulmonary:  ?   Breath sounds: Normal breath sounds.  ?Chest:  ?Breasts: ?   Right: No swelling, bleeding, inverted nipple, mass, nipple discharge or skin change.  ?   Left: No swelling, bleeding, inverted nipple, mass, nipple discharge or skin change.  ?Abdominal:  ?   General: Bowel sounds are normal. There is no distension.  ?   Palpations: Abdomen is soft. There is no mass.  ?   Tenderness: There is no abdominal tenderness.  ?Musculoskeletal:     ?   General: No tenderness.  ?   Cervical back: Normal range of motion and neck supple.  ?   Right lower leg: No edema.  ?   Left lower leg: No edema.  ?Lymphadenopathy:  ?   Cervical: No cervical adenopathy.  ?   Right cervical: No superficial, deep or posterior cervical adenopathy. ?   Left cervical: No superficial, deep or posterior cervical adenopathy.  ?   Upper Body:  ?   Right upper body: No supraclavicular or axillary adenopathy.  ?   Left upper body: No supraclavicular or axillary adenopathy.  ?  Lower Body: No right inguinal adenopathy. No left inguinal adenopathy.  ?Skin: ?   Coloration: Skin is not jaundiced.  ?   Findings: No lesion or rash.  ?Neurological:  ?   General: No focal deficit present.  ?   Mental Status: She is alert and oriented to person, place, and time. Mental status is at baseline.  ?Psychiatric:     ?   Mood and Affect: Mood is depressed.     ?   Behavior: Behavior normal.     ?   Thought Content: Thought content normal.     ?   Judgment: Judgment normal.  ? ? ?ASSESSMENT & PLAN:  ?A 61 y.o. 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  Based upon  her clinical breast exam today and recent mammogram, the patient remains disease free.  She knows to continue taking her anastrozole daily for 5 total years of adjuvant endocrine therapy.  With respect to her painful neuropathy, I will switch her from hydrocodone/APAP to oxycodone, which she will take 5-10 mg every 6 hours prn.  This will be used in conjunction with her Lyrica.  As her neuropathy remains prominent, I will refer her to neurology for further evaluation.  From a breast cancer standpoint, the patient is doing well.  I will begin spacing all future visits out to every 6 months.  The patient understands all the plans discussed today and is in agreement with them.   ? ?Gissela Bloch Macarthur Critchley, MD ? ? ? ?  ?

## 2021-09-11 ENCOUNTER — Encounter: Payer: Self-pay | Admitting: Oncology

## 2021-09-11 ENCOUNTER — Other Ambulatory Visit: Payer: Self-pay

## 2021-09-11 ENCOUNTER — Inpatient Hospital Stay: Payer: Managed Care, Other (non HMO) | Attending: Oncology | Admitting: Oncology

## 2021-09-11 ENCOUNTER — Other Ambulatory Visit: Payer: Self-pay | Admitting: Oncology

## 2021-09-11 VITALS — BP 142/87 | HR 76 | Temp 99.1°F | Resp 16 | Ht 64.0 in | Wt 205.1 lb

## 2021-09-11 DIAGNOSIS — G620 Drug-induced polyneuropathy: Secondary | ICD-10-CM

## 2021-09-11 DIAGNOSIS — C50012 Malignant neoplasm of nipple and areola, left female breast: Secondary | ICD-10-CM | POA: Diagnosis not present

## 2021-09-11 MED ORDER — OXYCODONE HCL 5 MG PO TABS: ORAL_TABLET | ORAL | 0 refills | Status: DC

## 2021-09-11 NOTE — Addendum Note (Signed)
Addended by: Velora Heckler on: 09/11/2021 04:43 PM ? ? Modules accepted: Orders ? ?

## 2021-09-12 ENCOUNTER — Encounter: Payer: Self-pay | Admitting: Neurology

## 2021-09-14 DIAGNOSIS — Z853 Personal history of malignant neoplasm of breast: Secondary | ICD-10-CM | POA: Insufficient documentation

## 2021-09-21 ENCOUNTER — Telehealth (HOSPITAL_COMMUNITY): Payer: Self-pay

## 2021-09-21 NOTE — Telephone Encounter (Signed)
Pt is interested in the pulmonary rehab program at [facility]. Will fax pt referral over. ?

## 2021-09-29 ENCOUNTER — Telehealth: Payer: Self-pay | Admitting: Pulmonary Disease

## 2021-09-29 NOTE — Telephone Encounter (Signed)
Called and spoke with patient. She stated that she received a call from the pulmonary rehab department at [facility] stating that they needed insurance authorization in order for her to attend rehab. I advised her that the rehab dept will be the one to contact her insurance for the authorization as our office does not normally do them. I advised her that I would call the rehab to see why are not able to do their own authorization. She verbalized understanding.  ? ?Called the pulm rehab at [facility] at [phone] but was unable to speak with anyone. Will try to call back on Monday.  ? ? ?

## 2021-10-05 NOTE — Telephone Encounter (Signed)
Called [facility] and spoke with Octavia Bruckner about the call from pt. Asked him about the prior authorization to see if that has been done yet and he said that they do not do anything with the prior authorizations prior to pts starting rehab as this would have to come from either PCP or pulmonary office. ? ? ?Routing to PCCs to check status to see if this has been able to be done. Tim said that they can get pt in for orientation next week. ?

## 2021-10-06 ENCOUNTER — Telehealth: Payer: Self-pay | Admitting: Pulmonary Disease

## 2021-10-06 NOTE — Telephone Encounter (Signed)
PCC's are you able to help with any information in regards to getting prior Auth for this patient? ?

## 2021-10-06 NOTE — Telephone Encounter (Signed)
Patient states [facility] needs prior authorization through insurance. Patient's insurance is [employer] and [facility] Medicaid but the rehab does not accept her insurance.  Patient phone number is [phone]. Patient needs a call back,so that they can fully understand the process of getting the classes that are needed. Routing to triage in high priority because patient express her frustration about not knowing what's going on with her rehab. ?

## 2021-10-06 NOTE — Telephone Encounter (Signed)
Going to close this encounter because there is another encounter that is still open in regards to this. Please see the encounter from 09/29/21. ?

## 2021-10-11 NOTE — Telephone Encounter (Signed)
I will take a look at this in the morning.  ?

## 2021-10-12 NOTE — Telephone Encounter (Addendum)
Rec'd auth from [facility] for [phone] from 10/16/21-12/16/21, auth# [id number].  Faxed to [facility], [phone] & rec'd fax confirm.  Nothing further needed a this time.  ?

## 2021-10-12 NOTE — Telephone Encounter (Signed)
Left message with [facility] to see what codes need to be authorized.  Will complete auth once I know what needs to be authorized.  ?

## 2021-11-17 ENCOUNTER — Other Ambulatory Visit: Payer: Self-pay

## 2021-11-17 DIAGNOSIS — T451X5A Adverse effect of antineoplastic and immunosuppressive drugs, initial encounter: Secondary | ICD-10-CM

## 2021-11-17 MED ORDER — OXYCODONE HCL 5 MG PO TABS: ORAL_TABLET | ORAL | 0 refills | Status: DC

## 2021-11-20 ENCOUNTER — Other Ambulatory Visit: Payer: Self-pay | Admitting: Hematology and Oncology

## 2021-11-20 MED ORDER — NALOXONE HCL 4 MG/0.1ML NA LIQD: NASAL | 2 refills | Status: AC

## 2021-11-20 MED ORDER — OXYCODONE HCL 10 MG PO TABS: 5.0000 mg | ORAL_TABLET | ORAL | 0 refills | Status: DC | PRN

## 2021-11-22 ENCOUNTER — Other Ambulatory Visit: Payer: Self-pay | Admitting: Hematology and Oncology

## 2021-11-23 ENCOUNTER — Inpatient Hospital Stay: Admission: RE | Admit: 2021-11-23 | Payer: Managed Care, Other (non HMO) | Source: Ambulatory Visit

## 2021-11-27 ENCOUNTER — Ambulatory Visit (INDEPENDENT_AMBULATORY_CARE_PROVIDER_SITE_OTHER): Payer: Managed Care, Other (non HMO) | Admitting: Pulmonary Disease

## 2021-11-27 ENCOUNTER — Encounter: Payer: Self-pay | Admitting: Pulmonary Disease

## 2021-11-27 VITALS — BP 118/68 | HR 80 | Temp 98.4°F | Ht 64.0 in | Wt 204.8 lb

## 2021-11-27 DIAGNOSIS — Z853 Personal history of malignant neoplasm of breast: Secondary | ICD-10-CM | POA: Diagnosis not present

## 2021-11-27 DIAGNOSIS — R0602 Shortness of breath: Secondary | ICD-10-CM

## 2021-11-27 DIAGNOSIS — J9611 Chronic respiratory failure with hypoxia: Secondary | ICD-10-CM | POA: Diagnosis not present

## 2021-11-27 DIAGNOSIS — J449 Chronic obstructive pulmonary disease, unspecified: Secondary | ICD-10-CM | POA: Diagnosis not present

## 2021-11-27 DIAGNOSIS — Z9221 Personal history of antineoplastic chemotherapy: Secondary | ICD-10-CM | POA: Diagnosis not present

## 2021-11-27 NOTE — Progress Notes (Signed)
Subjective:   PATIENT ID: Ruth White GENDER: female DOB: 1961/04/20, MRN: [id number]   HPI  Chief Complaint  Patient presents with   Follow-up    SOB with exertion, wants POC    Reason for Visit: Follow-up shortness of breath, PFTs  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   Initial Consult 07/10/21 She is referred by her PCP. Note reviewed from 06/14/21. She reports shortness of breath after completing chemotherapy in August/September. Associated with coughing. Occasional wheezing. No chest congestion. Occurs with any exertion including with walking, bathing and talking. Bending down is difficult for her due to dizziness. She had a negative CXR in the fall and normal SpO2. She has tried on Trelegy for one week but unable to take due to feeling choked up. She was switched to [facility] and has been compliant for the last month. She reports partial improvement. Before her cancer treatment two years ago she is extremely active and functional.  08/22/21 She presented for PFT evaluation. Since our last visit she has been taking Breztri with partial improvement ~30%. Her shortness of breath and coughing has improved. Occasional wheezing. She is walking two laps around the yard. Husband is present and reports she is more active.  11/27/21 Husband present and provides additional history with patient. She is compliant with Breztri twice daily. She takes albuterol once a day. She continues to shortness of breath. Occasional deep wheeze and cough with exertion. Not currently in Pulmonary Rehab due to frequent doctor visits and fatigue. She was recently hospitalized [facility] (5/4-10/24/21) for colitis requiring antibiotics (amoxicillin). Denies needing vancomycin. She  walking around with the walker at the house daily but has low energy at times.   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 8 g 2   anastrozole (ARIMIDEX) 1 MG tablet TAKE 1 TABLET BY MOUTH EVERY DAY TO PREVENT FUTURE DISEASE RECURRENCE 90 tablet 3   ascorbic acid (VITAMIN C) 1000 MG tablet Take 1 tablet by mouth daily.     aspirin 325 MG EC tablet Take 325 mg by mouth daily.     atenolol (TENORMIN) 50 MG tablet Take 50 mg by mouth daily.     BREZTRI AEROSPHERE 160-9-4.8 MCG/ACT AERO      chlorhexidine (PERIDEX) 0.12 % solution      [employer] PURELAX 17 GM/SCOOP powder Take 17 g by mouth in the morning and at bedtime. 255 g 2   cyclobenzaprine (FLEXERIL) 10 MG tablet Take 1 tablet by mouth 2 (two) times daily as needed.     dexamethasone (DECADRON) 4 MG tablet Take 4 mg by mouth 2 (two) times daily with  a meal.     diclofenac Sodium (VOLTAREN) 1 % GEL Apply topically.     escitalopram (LEXAPRO) 20 MG tablet Take 1 tablet (20 mg total) by mouth daily. 90 tablet 2   ezetimibe (ZETIA) 10 MG tablet Take 10 mg by mouth daily.     FARXIGA 5 MG TABS tablet Take 10 mg by mouth every morning.     hydrochlorothiazide (HYDRODIURIL) 12.5 MG tablet Take 12.5 mg by mouth daily.     ibuprofen (ADVIL) 800 MG tablet Take 800 mg by mouth every 8 (eight) hours as  needed.     lactulose (CHRONULAC) 10 GM/15ML solution ON FIRST DAY, TAKE 15ML BY MOUTH EVERY HOUR FOR 3 DOSES. IF NO BM AFTER 3RD DOSE PT TO ADMINISTER A FLEETS ENEMA. REPEAT THIS CYCLE X 1 IF NEEDED. DAILY MAINTENANCE DOSING WILL BE 15ML DAILY 236 mL 2   lactulose, encephalopathy, (CHRONULAC) 10 GM/15ML SOLN Take 15 mLs (10 g total) by mouth daily. 473 mL 2   lidocaine-prilocaine (EMLA) cream Apply 1 application. topically as needed.     liraglutide (VICTOZA) 18 MG/3ML SOPN Inject into the skin.     lisinopril (ZESTRIL) 40 MG tablet Take 40 mg by mouth daily.     magic mouthwash SOLN Take 5 mLs by mouth.     metFORMIN (GLUCOPHAGE) 500 MG tablet Take by mouth 2 (two) times daily with a meal.     naloxone (NARCAN) nasal spray 4 mg/0.1 mL Use as directed for overdose of narcotic medication 1 each 2   ondansetron (ZOFRAN) 4 MG tablet Take 4 mg by mouth every 8 (eight) hours as needed for nausea or vomiting.     ondansetron (ZOFRAN-ODT) 4 MG disintegrating tablet Take 4 mg by mouth every 8 (eight) hours as needed.     oxyCODONE (OXY IR/ROXICODONE) 5 MG immediate release tablet 1-2 tabs every 6 hours as needed for pain 50 tablet 0   Oxycodone HCl 10 MG TABS Take 0.5 tablets (5 mg total) by mouth every 4 (four) hours as needed. 120 tablet 0   pregabalin (LYRICA) 75 MG capsule TAKE 1 CAPSULE BY MOUTH TWICE A DAY 180 capsule 0   prochlorperazine (COMPAZINE) 10 MG tablet Take 1 tablet (10 mg total) by mouth every 6 (six) hours as needed for nausea or vomiting. 30 tablet 1   rosuvastatin (CRESTOR) 40 MG tablet Take 40 mg by mouth daily.     SENEXON-S 8.6-50 MG tablet TAKE 2 TABLETS BY MOUTH TWICE A DAY 120 tablet 2   vitamin C (ASCORBIC ACID) 250 MG tablet Take 500 mg by mouth daily.     No facility-administered medications prior to visit.    Review of Systems  Constitutional:  Positive for malaise/fatigue. Negative for chills, diaphoresis, fever and weight loss.  HENT:  Negative for congestion.    Respiratory:  Positive for cough, shortness of breath and wheezing. Negative for hemoptysis and sputum production.   Cardiovascular:  Negative for chest pain, palpitations and leg swelling.     Objective:   Vitals:   11/27/21 1015  BP: 118/68  Pulse: 80  Temp: 98.4 F (36.9 C)  TempSrc: Oral  SpO2: 100%  Weight: 204 lb 12.8 oz (92.9 kg)  Height: '5\' 4"'$  (1.626 m)   SpO2: 100 % O2 Device: Nasal cannula O2 Flow Rate (L/min): 2 L/min O2 Type: Continuous O2  Physical Exam: General: Well-appearing, no acute distress HENT: [place], AT Eyes: EOMI, no scleral icterus Respiratory: Clear to auscultation bilaterally.  No crackles,  wheezing or rales Cardiovascular: RRR, -M/R/G, no JVD Extremities:-Edema,-tenderness Neuro: AAO x4, CNII-XII grossly intact Psych: Normal mood, normal affect  Data Reviewed:  Imaging: CXR 07/10/2021- No infiltrate, effusion or edema. Low lung volumes.  PFT: 08/22/21  FVC 1.05 (39%) FEV1 0.67 (31%) Ratio 67   Unable to complete TLC or DLCO due to effort Interpretation: Very severe obstructive defect. No significant bronchodilator response  Labs: BMET from 12/30/2020 reviewed.  No abnormalities.  Normal electrolytes and renal function   Assessment & Plan:   Discussion: 61 year old female with hx of multifocal stage Ia breast cancer s/p lumpectomy 07/2019 and radiation on adjuvant therapy, peripheral neuropathy, depression who presents for follow-up. She continues to have shortness of breath after chemotherapy in the fall 2022 and has had recent hospitalization. She is sedentary at baseline. Ambulatory O2 today indicates no further O2 need but she remains severely dyspneic. Deconditioning is likely driving her symptoms. Low suspicion for cardiac etiology based on clinical exam and symptoms however can consider echo in the future. For now, encourage regular aerobic activity. She has declined Pulmonary rehab however if she wishes to improve I have strongly  encouraged her to prioritize this tool for symptom management and to at least consider increasing her activity at home  Dyspnea on exertion - multifactorial with deconditioning, severe COPD and hx chemotherapy/radiation Severe COPD (non-smoker) Hx of chemotherapy/radiation --CONTINUE Breztri TWO puffs TWICE a day --CONTINUE Albuterol TWO puffs AS NEEDED for shortness of breath or wheezing. Use before exercise --Pulmonary Rehab referral in place. Please consider participating when able  --REQUEST records from [facility] (5/4-10/24/21). If CT chest present, will cancel CT scan --Currently scheduled CT Chest without contrast in July to evaluate for parenchymal abnormalities  Chronic hypoxemic respiratory failure --Performed ambulatory O2 in-clinic with no desaturations --CONTINUE supplemental oxygen as needed for goal SpO2 >88%  --F/u records from Dr. Pamella Pert with spirometry, [facility]  There is no immunization history on file for this patient. CT Lung Screen - never smoker. Not qualified.  No orders of the defined types were placed in this encounter.  No orders of the defined types were placed in this encounter.   Return in about 1 month (around 12/27/2021).  I have spent a total time of 35-minutes on the day of the appointment including chart review, data review, collecting history, coordinating care and discussing medical diagnosis and plan with the patient/family. Past medical history, allergies, medications were reviewed. Pertinent imaging, labs and tests included in this note have been reviewed and interpreted independently by me.  [place], MD [facility] 11/27/2021 10:33 AM  Office Number [phone]

## 2021-11-27 NOTE — Patient Instructions (Addendum)
Severe COPD (non-smoker) Hx of chemotherapy/radiation --CONTINUE Breztri TWO puffs TWICE a day --CONTINUE Albuterol TWO puffs AS NEEDED for shortness of breath or wheezing. Use before exercise  --REQUEST records from [facility] (5/4-10/24/21). If CT chest present, will cancel CT scan --Currently scheduled CT Chest without contrast in July to evaluate for parenchymal abnormalities  Chronic hypoxemic respiratory failure --CONTINUE supplemental oxygen for goal SpO2 >88% --Perform ambulatory O2 in-clinic  Follow-up with me in July after CT scan to discuss results

## 2021-11-28 ENCOUNTER — Other Ambulatory Visit: Payer: Self-pay | Admitting: Hematology and Oncology

## 2021-11-28 DIAGNOSIS — T451X5A Adverse effect of antineoplastic and immunosuppressive drugs, initial encounter: Secondary | ICD-10-CM

## 2021-12-03 ENCOUNTER — Other Ambulatory Visit: Payer: Self-pay | Admitting: Hematology and Oncology

## 2021-12-03 DIAGNOSIS — C50012 Malignant neoplasm of nipple and areola, left female breast: Secondary | ICD-10-CM

## 2021-12-04 ENCOUNTER — Other Ambulatory Visit: Payer: Self-pay | Admitting: Hematology and Oncology

## 2021-12-04 DIAGNOSIS — K5900 Constipation, unspecified: Secondary | ICD-10-CM

## 2021-12-04 DIAGNOSIS — K5903 Drug induced constipation: Secondary | ICD-10-CM

## 2021-12-05 ENCOUNTER — Telehealth: Payer: Self-pay | Admitting: Pulmonary Disease

## 2021-12-07 NOTE — Telephone Encounter (Signed)
Ruth White states they have a CT scan. Ruth White phone number is [phone].

## 2021-12-08 ENCOUNTER — Other Ambulatory Visit: Payer: Self-pay | Admitting: Hematology and Oncology

## 2021-12-08 DIAGNOSIS — C50012 Malignant neoplasm of nipple and areola, left female breast: Secondary | ICD-10-CM

## 2021-12-08 DIAGNOSIS — K5903 Drug induced constipation: Secondary | ICD-10-CM

## 2021-12-08 MED ORDER — CHLORHEXIDINE GLUCONATE 0.12 % MT SOLN: 10.0000 mL | Freq: Four times a day (QID) | OROMUCOSAL | 3 refills | Status: DC

## 2021-12-08 MED ORDER — LACTULOSE ENCEPHALOPATHY 10 GM/15ML PO SOLN: 10.0000 g | Freq: Every day | ORAL | 2 refills | Status: AC

## 2021-12-08 MED ORDER — ANASTROZOLE 1 MG PO TABS: ORAL_TABLET | ORAL | 3 refills | Status: DC

## 2021-12-18 ENCOUNTER — Ambulatory Visit
Admission: RE | Admit: 2021-12-18 | Discharge: 2021-12-18 | Disposition: A | Discharge status: Home / Self Care | Payer: Managed Care, Other (non HMO) | Source: Ambulatory Visit | Attending: Pulmonary Disease | Admitting: Pulmonary Disease

## 2021-12-18 DIAGNOSIS — J449 Chronic obstructive pulmonary disease, unspecified: Secondary | ICD-10-CM

## 2021-12-29 ENCOUNTER — Ambulatory Visit (INDEPENDENT_AMBULATORY_CARE_PROVIDER_SITE_OTHER): Payer: Managed Care, Other (non HMO) | Admitting: Cardiovascular Disease

## 2021-12-29 ENCOUNTER — Encounter: Payer: Self-pay | Admitting: Cardiovascular Disease

## 2021-12-29 VITALS — BP 96/60 | HR 88 | Ht 64.0 in | Wt 201.2 lb

## 2021-12-29 DIAGNOSIS — I700 Atherosclerosis of aorta: Secondary | ICD-10-CM | POA: Insufficient documentation

## 2021-12-29 DIAGNOSIS — I10 Essential (primary) hypertension: Secondary | ICD-10-CM

## 2021-12-29 DIAGNOSIS — C50912 Malignant neoplasm of unspecified site of left female breast: Secondary | ICD-10-CM

## 2021-12-29 DIAGNOSIS — J9611 Chronic respiratory failure with hypoxia: Secondary | ICD-10-CM | POA: Diagnosis not present

## 2021-12-29 DIAGNOSIS — E119 Type 2 diabetes mellitus without complications: Secondary | ICD-10-CM | POA: Diagnosis not present

## 2021-12-29 DIAGNOSIS — E7800 Pure hypercholesterolemia, unspecified: Secondary | ICD-10-CM | POA: Diagnosis not present

## 2021-12-29 NOTE — Progress Notes (Signed)
Cardiology Office Note:    Date:  12/29/2021   ID:  Ruth White, DOB 06-25-1960, MRN [id number]  PCP:  Marco Collie, [place] [facility] Cardiologist:  Sanda Klein, MD     Referring MD: Maryella Shivers, MD   Chief Complaint  Patient presents with   Consult  Ruth White is a 61 y.o. female who is being seen today for the evaluation of hypercholesterolemia at the request of Maryella Shivers, MD.   History of Present Illness:    Ruth White is a 61 y.o. 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  She has had a difficult time recovering from treatment for breast cancer.  She began developing neuropathy in her fingers and feet after about the fourth course of treatment and is steadily worsened.  She is also developed substantial fatigue and has gained weight.  She used to be very physically active and very energetic.  After treatment for breast cancer she becomes easily tired and is wearing oxygen..  She has significant neuropathy in her feet and sometimes is unsteady since she is not sure where her feet are.  She feels that her fingers are always on fire.  She denies any chest pain at rest or with activity.  She does not have orthopnea or PND or lower extremity edema.  She has not had dizziness, palpitations or syncope, but she often feels unsteady on her feet.  She has severe abnormalities on pulmonary function tests.  FVC is 35% of predicted.  FEV1 is 30% of predicted (only 0.64 L).  CT chest performed 12/18/2021 was unremarkable. No record of BNP testing.  Her blood pressure is a little low today, but  is usually higher, for example it was 120/80 at her last visit with Dr. Nyra Capes.  Glycemic control is good with a hemoglobin A1c of 7.1% in May, at which point the dose of Farxiga was increased from 5 to 10 mg daily.  Despite taking maximum dose rosuvastatin 40 mg daily and ezetimibe 10 mg daily she has a very high cholesterol and LDL cholesterol levels (total cholesterol 231, LDL 162, HDL 56, triglycerides 76).)  Her most recent creatinine was 1.26 corresponding to a GFR of about 50.  Other labs are normal.  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   anastrozole (ARIMIDEX) 1 MG tablet TAKE 1 TABLET BY MOUTH EVERY DAY TO PREVENT FUTURE DISEASE RECURRENCE   aspirin 325 MG EC tablet Take 325 mg by mouth daily.   atenolol (TENORMIN) 50 MG tablet Take 50 mg by mouth daily.   BD PEN NEEDLE NANO  2ND GEN 32G X 4 MM MISC 1 (ONE) PEN NEEDLE USE DAILY   Blood Glucose Monitoring Suppl (ACCU-CHEK GUIDE ME) w/Device KIT 1 (ONE) KIT CHECK BLOOD GLUCOSE DAILY   BREZTRI AEROSPHERE 160-9-4.8 MCG/ACT AERO    diclofenac Sodium (VOLTAREN) 1 % GEL Apply topically.   ezetimibe (ZETIA) 10 MG tablet Take 10 mg by mouth at bedtime.   FARXIGA 5 MG TABS tablet Take 10 mg by mouth every morning.   fluconazole (DIFLUCAN) 200 MG tablet Take 200 mg by mouth daily.   glucose blood (ACCU-CHEK GUIDE) test strip 1 (ONE) EACH DAILY AND AS NEEDED FOR SYMPTOMS    ibuprofen (ADVIL) 800 MG tablet Take 800 mg by mouth every 8 (eight) hours as needed.   lactulose, encephalopathy, (CHRONULAC) 10 GM/15ML SOLN Take 15 mLs (10 g total) by mouth daily.   liraglutide (VICTOZA) 18 MG/3ML SOPN Inject into the skin.   lisinopril (ZESTRIL) 40 MG tablet Take 40 mg by mouth daily.   metFORMIN (GLUCOPHAGE) 500 MG tablet Take by mouth 2 (two) times daily with a meal.   ondansetron (ZOFRAN) 4 MG tablet Take 4 mg by mouth every 8 (eight) hours as needed for nausea or vomiting.   Oxycodone HCl 10 MG TABS Take 0.5 tablets (5 mg total) by mouth every 4 (four) hours as needed.   pregabalin (LYRICA) 75 MG capsule TAKE 1 CAPSULE BY MOUTH TWICE A DAY   rosuvastatin (CRESTOR) 40 MG tablet Take 40 mg by mouth daily.   sertraline (ZOLOFT) 50 MG tablet Take 1 tablet by mouth daily.     Allergies:   Molnupiravir   Social History   Socioeconomic History   Marital status: Married    Spouse name: CHRISTOPHER   Number of children: Not on file   Years of education: Not on file   Highest education level: Not on file  Occupational History   Not on file  Tobacco Use   Smoking status: Never   Smokeless tobacco: Never  Substance and Sexual Activity   Alcohol use: Not on file   Drug use: Not on file   Sexual activity: Not on file  Other Topics Concern   Not on file  Social History Narrative   Not on file   Social Determinants of Health   Financial Resource Strain: Not on file  Food Insecurity: Not on file  Transportation Needs: Not on file  Physical Activity: Not on file  Stress: Not on file  Social Connections: Not on file     Family History: The patient's father had coronary disease in his late 61s and died at age 16.  Her brother is in his early 33s.  In his early 76s he had 2 episodes of myocardial infarction and had bypass surgery.  ROS:   Please see the history of present illness.    All other systems reviewed and are negative.  EKGs/Labs/Other Studies  Reviewed:    The following studies were reviewed today: Chest CT without contrast from 12/18/2021 shows very mild aortic atherosclerosis and no visible coronary artery calcifications.  Echocardiogram 07/14/2020   1. GLS -20.9%.   2. Left ventricular ejection fraction, by estimation, is 60 to 65%. The  left ventricle has normal function. The left ventricle has no regional  wall motion abnormalities. Left ventricular diastolic parameters were  normal.   3. Right ventricular systolic function is normal. The right ventricular  size is normal.   4. The mitral valve is normal in structure. No evidence of mitral valve  regurgitation. No evidence of mitral stenosis.   5. The aortic valve is tricuspid. Aortic valve regurgitation is not  visualized. No aortic stenosis is present.   6. The inferior vena cava is normal in size with greater than 50%  respiratory variability, suggesting right atrial pressure of 3 mmHg.   EKG:  EKG is ordered today.  The ekg ordered today demonstrates normal sinus rhythm, reduced voltage limited to leads V3-V6, QT 440 ms  Recent Labs: No results found for requested labs within last 365 days.  Recent Lipid Panel No results found for: "CHOL", "TRIG", "HDL", "CHOLHDL", "VLDL", "LDLCALC", "LDLDIRECT"  10/30/2021 Glucose 111, creatinine 1.26, potassium 4.2, normal liver function tests, hemoglobin A1c 7.1%, Cholesterol 231, triglycerides 76, HDL 56, LDL 162  Risk Assessment/Calculations:           Physical Exam:    VS:  BP 96/60 (BP Location: Right Arm, Patient Position: Sitting, Cuff Size: Large)   Pulse 88   Ht 5' 4" (1.626 m)   Wt 201 lb 3.2 oz (91.3 kg)   LMP 08/12/2020 (LMP Unknown)   SpO2 95%   BMI 34.54 kg/m     Wt Readings from Last 3 Encounters:  12/29/21 201 lb 3.2 oz (91.3 kg)  11/27/21 204 lb 12.8 oz (92.9 kg)  09/11/21 205 lb 1.6 oz (93 kg)     GEN: Moderately obese, well nourished, well developed in no acute distress HEENT:  Normal NECK: No JVD; No carotid bruits LYMPHATICS: No lymphadenopathy CARDIAC: RRR, no murmurs, rubs, gallops RESPIRATORY:  Clear to auscultation without rales, wheezing or rhonchi  ABDOMEN: Soft, non-tender, non-distended MUSCULOSKELETAL:  No edema; No deformity  SKIN: Warm and dry NEUROLOGIC:  Alert and oriented x 3 PSYCHIATRIC:  Normal affect   ASSESSMENT:    1. Pure hypercholesterolemia   2. Type 2 diabetes mellitus without complication, without long-term current use of insulin ([place])   3. Essential hypertension   4. Chronic respiratory failure with hypoxia ([facility])   5. HER2-positive carcinoma of left breast ([place])   6. 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  We will start PCSK9 inhibitor, Repatha 140 mg every other week and recheck labs including LP(a) in about 2 to 3 months.  I am pretty confident we will be able to stop the Zetia and achieve target LDL less than 100 (has diabetes).  May benefit from siRNA if clinical trials show that this type of medication will be beneficial for elevated LP(a). DM: Very close to target range with A1c 7.1%.  Farxiga dose has been increased. HTN: Borderline low blood pressure today, but asymptomatic.  Continue same medications. Chronic respiratory failure due to combined obstructive and restrictive lung disease: Presumably primarily due to radiation and chemotherapy for breast cancer, some history of wood-burning stove exposure as a child.  She has never smoked. Breast cancer status postchemotherapy with residual neuropathy: Neuropathic symptoms likely due to paclitaxel.  She received Herceptin but has been off this medication now for over a year.  It is unlikely that her fatigue/dyspnea has anything to do with Herceptin induced cardiotoxicity at this point.  Most recent echocardiogram showed  both normal systolic function and normal diastolic function. Risk factors for CAD/Aortic atherosclerosis: With the exception of the elevated LDL cholesterol these risk factors are well addressed.  She has no evidence of coronary artery calcification and has  not complained of angina.  No ischemic changes on ECG.  There is a record of a myocardial nuclear perfusion study performed in the summer 2022 that was presumably normal, although I cannot find the complete report (performed at [facility]?); we will try to get that report.  At this point further evaluation for coronary disease does not appear to be necessary, but it is important to treat her LDL cholesterol aggressively.            Medication Adjustments/Labs and Tests Ordered: Current medicines are reviewed at length with the patient today.  Concerns regarding medicines are outlined above.  Orders Placed This Encounter  Procedures   Lipoprotein A (LPA)   Lipid panel   AMB Referral to [facility] Pharm-D   EKG 12-Lead   No orders of the defined types were placed in this encounter.   Patient Instructions  Medication Instructions:  Dr. Sallyanne Kuster recommends Repatha or Praluent(PCSK9). This is an injectable cholesterol medication. This medication will need prior approval with your insurance company, which we will work on. If the medication is not approved initially, we may need to do an appeal with your insurance. We will keep you updated on this process. This medication can be provided at some local pharmacies or be shipped to you from a specialty pharmacy.   *If you need a refill on your cardiac medications before your next appointment, please call your pharmacy*   Lab Work: Your provider would like for you to return in 3 months to have the following labs drawn: Lipid and LPa. You do not need an appointment for the lab. Once in our office lobby there is a podium where you can sign in and ring the doorbell to alert [place] that you are  here. The lab is open from 8:00 am to 4 pm; closed for lunch from 12:45pm-1:45pm.  You may also go to any of these [facility] locations:   [place] ([place]) - [place] [address] [place] [address] [place] - [address] ([facility]   If you have labs (blood work) drawn today and your tests are completely normal, you will receive your results only by: [employer] (if you have MyChart) OR A paper copy in the mail If you have any lab test that is abnormal or we need to change your treatment, we will call you to review the results.   Testing/Procedures: None ordered   Follow-Up: At [facility], you and your health needs are our priority.  As part of our continuing mission to provide you with exceptional heart care, we have created designated Provider Care Teams.  These Care Teams include your primary Cardiologist (physician) and Advanced Practice Providers (APPs -  Physician Assistants and Nurse Practitioners) who all work together to provide you with the care you need, when you need it.  We recommend signing up for the patient portal called "MyChart".  Sign up information is provided on this After Visit Summary.  MyChart is used to connect with patients for Virtual Visits (Telemedicine).  Patients are able to view lab/test results, encounter notes, upcoming appointments, etc.  Non-urgent messages can be sent to your provider as well.  To learn more about what you can do with MyChart, go to [email or web address].    Your next appointment:   12 month(s)  The format for your next appointment:   In Person  Provider:   Sanda Klein, MD {   A referral has been made to pharmD for Repathat     Signed, Sanda Klein, MD  12/29/2021 7:44 PM    [place]

## 2021-12-29 NOTE — Patient Instructions (Addendum)
Medication Instructions:  Dr. Sallyanne Kuster recommends Repatha or Praluent(PCSK9). This is an injectable cholesterol medication. This medication will need prior approval with your insurance company, which we will work on. If the medication is not approved initially, we may need to do an appeal with your insurance. We will keep you updated on this process. This medication can be provided at some local pharmacies or be shipped to you from a specialty pharmacy.   *If you need a refill on your cardiac medications before your next appointment, please call your pharmacy*   Lab Work: Your provider would like for you to return in 3 months to have the following labs drawn: Lipid and LPa. You do not need an appointment for the lab. Once in our office lobby there is a podium where you can sign in and ring the doorbell to alert [place] that you are here. The lab is open from 8:00 am to 4 pm; closed for lunch from 12:45pm-1:45pm.  You may also go to any of these [facility] locations:   [place] ([place]) - [place] [address] [place] [address] - [address] [place] - [address] ([facility]   If you have labs (blood work) drawn today and your tests are completely normal, you will receive your results only by: [employer] (if you have MyChart) OR A paper copy in the mail If you have any lab test that is abnormal or we need to change your treatment, we will call you to review the results.   Testing/Procedures: None ordered   Follow-Up: At [facility], you and your health needs are our priority.  As part of our continuing mission to provide you with exceptional heart care, we have created designated Provider Care Teams.  These Care Teams include your  primary Cardiologist (physician) and Advanced Practice Providers (APPs -  Physician Assistants and Nurse Practitioners) who all work together to provide you with the care you need, when you need it.  We recommend signing up for the patient portal called "MyChart".  Sign up information is provided on this After Visit Summary.  MyChart is used to connect with patients for Virtual Visits (Telemedicine).  Patients are able to view lab/test results, encounter notes, upcoming appointments, etc.  Non-urgent messages can be sent to your provider as well.   To learn more about what you can do with MyChart, go to [email or web address].    Your next appointment:   12 month(s)  The format for your next appointment:   In Person  Provider:   Sanda Klein, MD {   A referral has been made to pharmD for Repathat

## 2022-01-05 ENCOUNTER — Telehealth: Payer: Self-pay

## 2022-01-05 NOTE — Telephone Encounter (Signed)
[id number] has appt to see PHARMD 01-16-22

## 2022-01-05 NOTE — Telephone Encounter (Signed)
Discussed Repatha and PHARMD with Husband (DPR), verbalized understanding. Pt will be at [facility] appt on 01-16-22.

## 2022-01-12 ENCOUNTER — Ambulatory Visit (INDEPENDENT_AMBULATORY_CARE_PROVIDER_SITE_OTHER): Payer: Managed Care, Other (non HMO) | Admitting: Pulmonary Disease

## 2022-01-12 ENCOUNTER — Encounter: Payer: Self-pay | Admitting: Pulmonary Disease

## 2022-01-12 VITALS — BP 130/88 | HR 82 | Ht 64.0 in | Wt 196.4 lb

## 2022-01-12 DIAGNOSIS — R0602 Shortness of breath: Secondary | ICD-10-CM

## 2022-01-12 DIAGNOSIS — J449 Chronic obstructive pulmonary disease, unspecified: Secondary | ICD-10-CM | POA: Diagnosis not present

## 2022-01-12 MED ORDER — BREZTRI AEROSPHERE 160-9-4.8 MCG/ACT IN AERO: 2.0000 | INHALATION_SPRAY | Freq: Two times a day (BID) | RESPIRATORY_TRACT | 5 refills | Status: DC

## 2022-01-12 NOTE — Patient Instructions (Addendum)
  Dyspnea on exertion - multifactorial with deconditioning, severe COPD and hx chemotherapy/radiation Severe COPD (non-smoker) Hx of chemotherapy/radiation --CONTINUE Breztri TWO puffs TWICE a day --CONTINUE Albuterol TWO puffs AS NEEDED for shortness of breath or wheezing. Use before exercise --Continue exercising for goal >10,000 steps daily --Pulmonary Rehab referral at [facility] however not able to participate in three day a week schedule  >Please contact the following facilities to see if they have a better schedule (Two days a week)  >Call our office if you wish for a referral  Pulmonary Rehab  [facility]) - [place] [facility]) [phone]   Follow-up with me in 3 months

## 2022-01-12 NOTE — Progress Notes (Signed)
Subjective:   PATIENT ID: [name] GENDER: female DOB: 1960-10-29, MRN: [id number]   HPI  Chief Complaint  Patient presents with   Follow-up   Reason for Visit: Follow-up  [name] is a 61 year old female never smoker with history of multifocal stage Ia breast cancer status post lumpectomy 07/2019 and radiation on adjuvant therapy, peripheral neuropathy, depression who presents for follow-up  Initial Consult 07/10/21 She is referred by her PCP. Note reviewed from 06/14/21. She reports shortness of breath after completing chemotherapy in August/September. Associated with coughing. Occasional wheezing. No chest congestion. Occurs with any exertion including with walking, bathing and talking. Bending down is difficult for her due to dizziness. She had a negative CXR in the fall and normal SpO2. She has tried on Trelegy for one week but unable to take due to feeling choked up. She was switched to [facility] and has been compliant for the last month. She reports partial improvement. Before her cancer treatment two years ago she is extremely active and functional.  08/22/21 She presented for PFT evaluation. Since our last visit she has been taking Breztri with partial improvement ~30%. Her shortness of breath and coughing has improved. Occasional wheezing. She is walking two laps around the yard. Husband is present and reports she is more active.  11/27/21 Husband present and provides additional history with patient. She is compliant with Breztri twice daily. She takes albuterol once a day. She continues to shortness of breath. Occasional deep wheeze and cough with exertion. Not currently in Pulmonary Rehab due to frequent doctor visits and fatigue. She was recently hospitalized [facility] (5/4-10/24/21) for colitis requiring antibiotics (amoxicillin). Denies needing vancomycin. She walking around with the walker at the house daily but has low energy at times.    01/12/22 Husband present visit and provides additional history.  Since our last visit she has had shortness of breath. Trying to be more active walking 4000-5000 steps a day. Has some wheezing and coughing. Compliant with Breztri. She will use her albuterol 3-4 times a day, usually when she isn't wearing oxygen. She reports on room air that her pulse oximetry will read as low as 91%.  She was previously referred to pulmonary rehab but states that she is unable to make it to classes 3 days a week due to transportation issues.  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 8 g 2   anastrozole (ARIMIDEX) 1 MG tablet TAKE 1 TABLET BY MOUTH EVERY DAY TO PREVENT FUTURE DISEASE RECURRENCE 90 tablet 3   aspirin 325 MG EC tablet Take 325 mg by mouth daily.     atenolol (TENORMIN) 50 MG tablet Take 50  mg by mouth daily.     BD PEN NEEDLE NANO 2ND GEN 32G X 4 MM MISC 1 (ONE) PEN NEEDLE USE DAILY     Blood Glucose Monitoring Suppl (ACCU-CHEK GUIDE ME) w/Device KIT 1 (ONE) KIT CHECK BLOOD GLUCOSE DAILY     cyclobenzaprine (FLEXERIL) 10 MG tablet Take 1 tablet by mouth 2 (two) times daily as needed.     diclofenac Sodium (VOLTAREN) 1 %  GEL Apply topically.     ezetimibe (ZETIA) 10 MG tablet Take 10 mg by mouth at bedtime.     FARXIGA 5 MG TABS tablet Take 10 mg by mouth every morning.     fluconazole (DIFLUCAN) 200 MG tablet Take 200 mg by mouth daily.     glucose blood (ACCU-CHEK GUIDE) test strip 1 (ONE) EACH DAILY AND AS NEEDED FOR SYMPTOMS     ibuprofen (ADVIL) 800 MG tablet Take 800 mg by mouth every 8 (eight) hours as needed.     lactulose, encephalopathy, (CHRONULAC) 10 GM/15ML SOLN Take 15 mLs (10 g total) by mouth daily. 473 mL 2   liraglutide (VICTOZA) 18 MG/3ML SOPN Inject into the skin.     lisinopril (ZESTRIL) 40 MG tablet Take 40 mg by mouth daily.     magic mouthwash SOLN Take 5 mLs by mouth.     metFORMIN (GLUCOPHAGE) 500 MG tablet Take by mouth 2 (two) times daily with a meal.     naloxone (NARCAN) nasal spray 4 mg/0.1 mL Use as directed for overdose of narcotic medication 1 each 2   ondansetron (ZOFRAN) 4 MG tablet Take 4 mg by mouth every 8 (eight) hours as needed for nausea or vomiting.     Oxycodone HCl 10 MG TABS Take 0.5 tablets (5 mg total) by mouth every 4 (four) hours as needed. 120 tablet 0   pregabalin (LYRICA) 75 MG capsule TAKE 1 CAPSULE BY MOUTH TWICE A DAY 180 capsule 0   prochlorperazine (COMPAZINE) 10 MG tablet Take 1 tablet (10 mg total) by mouth every 6 (six) hours as needed for nausea or vomiting. 30 tablet 1   rosuvastatin (CRESTOR) 40 MG tablet Take 40 mg by mouth daily.     sertraline (ZOLOFT) 50 MG tablet Take 1 tablet by mouth daily.     BREZTRI AEROSPHERE 160-9-4.8 MCG/ACT AERO      No facility-administered medications prior to visit.    Review of Systems  Constitutional:  Positive for malaise/fatigue. Negative for chills, diaphoresis, fever and weight loss.  HENT:  Negative for congestion.   Respiratory:  Positive for shortness of breath. Negative for cough, hemoptysis, sputum production and wheezing.   Cardiovascular:  Negative for chest pain, palpitations and leg swelling.      Objective:   Vitals:   01/12/22 0949  BP: 130/88  Pulse: 82  SpO2: 100%  Weight: 196 lb 6.4 oz (89.1 kg)  Height: '5\' 4"'  (1.626 m)   SpO2: 100 % O2 Device: Nasal cannula O2 Flow Rate (L/min): 2 L/min O2 Type: Continuous O2  Physical Exam: General: Well-appearing, no acute distress HENT: [place], AT Eyes: EOMI, no scleral icterus Respiratory: Clear to auscultation bilaterally.  No crackles, wheezing or rales Cardiovascular: RRR, -M/R/G, no JVD Extremities:-Edema,-tenderness Neuro: AAO x4, CNII-XII grossly intact Psych: Normal mood, normal affect  Data Reviewed:  Imaging: CXR 07/10/2021- No infiltrate, effusion or edema. Low lung volumes. CT chest 12/18/2021-overall normal lung parenchyma  PFT: 08/22/21  FVC 1.05 (39%) FEV1 0.67 (31%) Ratio 67   Unable to complete TLC  or DLCO due to effort Interpretation: Very severe obstructive defect. No significant bronchodilator response  Labs: BMET from 12/30/2020 reviewed.  No abnormalities.  Normal electrolytes and renal function   Assessment & Plan:   Discussion: 61 year old female with history of multifocal stage Ia breast cancer s/p lumpectomy 07/2019 and radiation on adjuvant therapy, peripheral neuropathy, depression who presents for follow-up.  Ambulatory O2 in clinic demonstrates no further O2 need however she remains severely dyspneic like she did last visit.  Discussed how deconditioning is driving her symptoms.  We again discussed the value of pulmonary rehab and attempted to work through barriers preventing her from going (schedule, frequent doctor visits, driving distance, personal motivation).  Her husband is willing to drive her if they are able to accommodate to morning classes.  I have advised patient and husband to consider options aside from the [facility] rehab if the scheduling is better.  Dyspnea on exertion - multifactorial with deconditioning, severe COPD and hx chemotherapy/radiation Severe COPD (non-smoker) Hx of  chemotherapy/radiation -- Continue Breztri TWO puffs TWICE a day.  Refill --CONTINUE Albuterol TWO puffs AS NEEDED for shortness of breath or wheezing. Use before exercise --Continue exercising for goal >10,000 steps daily --Pulmonary Rehab referral at [facility] however not able to participate in three day a week schedule  >Please contact the following facilities to see if they have a better schedule (Two days a week)  >Call our office if you wish for a referral  Chronic hypoxemic respiratory failure-resolved --Ambulatory O2 in clinic with no desaturations --CONTINUE supplemental oxygen as needed for goal SpO2 >88%  Health Maintenance  There is no immunization history on file for this patient. CT Lung Screen - never smoker. Not qualified.  No orders of the defined types were placed in this encounter.  Meds ordered this encounter  Medications   BREZTRI AEROSPHERE 160-9-4.8 MCG/ACT AERO    Sig: Inhale 2 puffs into the lungs in the morning and at bedtime.    Dispense:  10.7 g    Refill:  5    Return in about 3 months (around 04/14/2022).  I have spent a total time of 35-minutes on the day of the appointment reviewing prior documentation, coordinating care and discussing medical diagnosis and plan with the patient/family. Past medical history, allergies, medications were reviewed. Pertinent imaging, labs and tests included in this note have been reviewed and interpreted independently by me.  [place], MD [facility] 01/12/2022  Office Number [phone]

## 2022-01-16 ENCOUNTER — Telehealth: Payer: Self-pay | Admitting: Pharmacist

## 2022-01-16 ENCOUNTER — Ambulatory Visit: Payer: Managed Care, Other (non HMO) | Admitting: Pharmacist

## 2022-01-16 DIAGNOSIS — E119 Type 2 diabetes mellitus without complications: Secondary | ICD-10-CM

## 2022-01-16 DIAGNOSIS — E7800 Pure hypercholesterolemia, unspecified: Secondary | ICD-10-CM

## 2022-01-16 DIAGNOSIS — I700 Atherosclerosis of aorta: Secondary | ICD-10-CM | POA: Diagnosis not present

## 2022-01-16 NOTE — Progress Notes (Signed)
Patient ID: Ruth White                 DOB: 25-Dec-1960                    MRN: [id number]     HPI: Ruth White is a 61 y.o. female patient referred to lipid clinic by Dr Ruth White. PMH is significant for breast cancer, TIA, COPD, HTN, HLD, and aortic atherosclerorsis.   Patient presents today with husband. Feels poorly from chemotherapy.  Very fatigued and unsteady on feet. Has neuropathy, uses O2.   On chronic opioid therapy but has a new pain management doctor who she likes because he is going to try to wean her off oxycodone.  Last A1c 7.1% on Farxiga, max dose Victoza and metformin 526m daily. She and her husband have dramatically changed their diets and avoiding carbohydrates. They concentrate on vegetables and whole grains.  She struggles with being physically active however due to feeling poorly.  LDL has remained elevated despite compliance with high intensity statin and Zetia.  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     oxyCODONE (OXY IR/ROXICODONE) 5 MG immediate release tablet Take by mouth.     ACCU-CHEK GUIDE test strip 1 (ONE) EACH DAILY AND AS NEEDED FOR SYMPTOMS     albuterol (VENTOLIN HFA) 108 (90 Base) MCG/ACT inhaler Inhale 2 puffs into the lungs every 6 (six) hours as needed for wheezing or shortness of  breath. 8 g 2   anastrozole (ARIMIDEX) 1 MG tablet TAKE 1 TABLET BY MOUTH EVERY DAY TO PREVENT FUTURE DISEASE RECURRENCE 90 tablet 3   aspirin 325 MG EC tablet Take 325 mg by mouth daily.     atenolol (TENORMIN) 50 MG tablet Take 50 mg by mouth daily.     BD PEN NEEDLE NANO 2ND GEN 32G X 4 MM MISC 1 (ONE) PEN NEEDLE USE DAILY     Blood Glucose Monitoring Suppl (ACCU-CHEK GUIDE ME) w/Device KIT 1 (ONE) KIT CHECK BLOOD GLUCOSE DAILY     BREZTRI AEROSPHERE 160-9-4.8 MCG/ACT AERO Inhale 2 puffs into the lungs in the morning and at bedtime. 10.7 g 5   cyclobenzaprine (FLEXERIL) 10 MG tablet Take 1 tablet by mouth 2 (two) times daily as needed.     diclofenac Sodium (VOLTAREN) 1 % GEL Apply topically.     DULoxetine (CYMBALTA) 30 MG capsule Take 30 mg by mouth daily.     ezetimibe (ZETIA) 10 MG tablet Take 10 mg by mouth at bedtime.     FARXIGA 10 MG TABS tablet Take 10 mg by mouth every morning.     FARXIGA 5 MG TABS tablet Take 10 mg by mouth every morning.     fluconazole (DIFLUCAN) 200 MG tablet Take 200 mg by mouth daily.  glucose blood (ACCU-CHEK GUIDE) test strip 1 (ONE) EACH DAILY AND AS NEEDED FOR SYMPTOMS     ibuprofen (ADVIL) 800 MG tablet Take 800 mg by mouth every 8 (eight) hours as needed.     lactulose, encephalopathy, (CHRONULAC) 10 GM/15ML SOLN Take 15 mLs (10 g total) by mouth daily. 473 mL 2   liraglutide (VICTOZA) 18 MG/3ML SOPN Inject into the skin.     lisinopril (ZESTRIL) 20 MG tablet Take 20 mg by mouth 2 (two) times daily.     lisinopril (ZESTRIL) 40 MG tablet Take 40 mg by mouth daily.     magic mouthwash SOLN Take 5 mLs by mouth.     metFORMIN (GLUCOPHAGE) 500 MG tablet Take by mouth 2 (two) times daily with a meal.     metFORMIN (GLUCOPHAGE-XR) 500 MG 24 hr tablet Take 500 mg by mouth daily.     naloxone (NARCAN) nasal spray 4 mg/0.1 mL Use as directed for overdose of narcotic medication 1 each 2   ondansetron (ZOFRAN) 4 MG tablet Take 4 mg by mouth every 8 (eight)  hours as needed for nausea or vomiting.     Oxycodone HCl 10 MG TABS Take 0.5 tablets (5 mg total) by mouth every 4 (four) hours as needed. 120 tablet 0   pregabalin (LYRICA) 75 MG capsule TAKE 1 CAPSULE BY MOUTH TWICE A DAY 180 capsule 0   prochlorperazine (COMPAZINE) 10 MG tablet Take 1 tablet (10 mg total) by mouth every 6 (six) hours as needed for nausea or vomiting. 30 tablet 1   rosuvastatin (CRESTOR) 40 MG tablet Take 40 mg by mouth daily.     sertraline (ZOLOFT) 50 MG tablet Take 1 tablet by mouth daily.     No current facility-administered medications on file prior to visit.    Allergies  Allergen Reactions   Molnupiravir Nausea And Vomiting    Assessment/Plan:  1. Hyperlipidemia - Patient's LDL is 162 which is above goal of <55. Aggressive goal chosen due to history of TIA and T2DM. Patinet compliant with rosuvasttain 44m and ezetimibe 142mbut LDL remains well above goal.  Recommended patient begin PCSK9i therapy.  Usine repatha demo pen, educated patient on mechanism of action, storage, site selection, administration and possible adverse effects.  Will complete PA and recheck lipid panel in 2-3 months. May be able to d/c ezetimibe at that time.  Discussed diet with patient and gave healthy eating handouts and resources. Patient voiced that daily Victoza injections were burdensome. Advised that that medication class comes in a once weekly formulation and patient and husband are interested. Do not know if plan prefers Ozempic or Mounjaro. Will submit PA and contact patient when approved. Should help with blood sugar and help reduce CV risk.  Patient and husband appreciative.  ChKarren CobblePharmD, BCACP, CDIreneCPHardwickSuSan [id number]: [phone]: [phone]

## 2022-01-16 NOTE — Telephone Encounter (Signed)
PA for Repatha submitted.  Key:  [id number]. Approved through 02/02/23

## 2022-01-16 NOTE — Patient Instructions (Signed)
It was nice meeting you two today  We would like to start you on a new medication called Repatha  You will inject one pen once every 2 weeks.  I will complete the prior authorization and call you when it is approved. After you start the medication we will recheck your cholesterol in 2-3 months  We would also like to start you on once weekly diabetes medication. I will contact you when it is approved  Please call with any questions  Karren Cobble, PharmD, [place], [place], [address], [place], [place], [address] Phone: [phone], Fax: [phone]

## 2022-01-22 NOTE — Progress Notes (Unsigned)
[facility] Clinic Note - Initial Visit   Date: 01/24/22  Ruth White MRN: [id number] DOB: [birth year]   Dear Dr. Bobby Rumpf:  Thank you for your kind referral of Ruth White for consultation of chemotherapy-induced neuropathy. Although her history is well known to you, please allow [place] to reiterate it for the purpose of our medical record. The patient was accompanied to the clinic by self.   History of Present Illness: Ruth White is a 61 y.o. right-handed female with diabetes mellitus (HbA1c 7.1), hyperlipidemia, hypertension, depression, and breast cancer s/p lumpectomy, chemotherapy, and radiation (2021), chronic pain presenting for evaluation of neuropathy.   She was diagnosed with breast cancer in 2021 and underwent adjuvant chemotherapy with weekly paclitaxel/Herceptin, however due to severe neuropathy, paclitaxel was discontinued a few weeks early.  Since this time, she continues to have severe shooting, needle-like pain in the hands and feet.  She also complains of generalized pain involving the arms and legs. Her has right hip pain, described as throbbing, which is worse when she lays on the right side.  She walks with a walker/cane for the past 1 year and more recently, a cane.  She takes Lyrica 24m BID and hydrocodone 510mq4h for relief.  She was recently started on cymbalta 3043m by pain management ([place], [place]).  She had diabetes which is fairly well-controlled, last HbA1c 7.1.  No history of alcohol use. Vitamin B12 and folate is within normal limits.    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    atenolol (TENORMIN) 50 MG tablet Take 50 mg by mouth daily.    ezetimibe (ZETIA) 10 MG tablet Take 10 mg by mouth at bedtime.    ibuprofen (ADVIL) 800 MG tablet Take 800 mg by mouth every 8 (eight) hours as needed.    liraglutide (VICTOZA) 18 MG/3ML SOPN Inject 1.8 mg into the skin daily.    lisinopril (ZESTRIL) 20 MG tablet Take 20 mg by mouth 2 (two) times daily.    magic mouthwash SOLN Take 5 mLs by mouth.    metFORMIN (GLUCOPHAGE-XR) 500 MG 24 hr tablet Take 500 mg by mouth daily.    ondansetron (ZOFRAN) 4 MG tablet Take 4 mg by mouth every 8 (eight) hours as needed for nausea or vomiting.    Oxycodone HCl 10 MG TABS Take 0.5 tablets (5 mg total) by mouth every 4 (four) hours as needed.    pregabalin (LYRICA) 75 MG capsule TAKE 1 CAPSULE BY MOUTH TWICE A DAY    rosuvastatin (CRESTOR) 40 MG tablet Take 40 mg by mouth daily.    senna (SENOKOT) 8.6 MG tablet Take 1 tablet by mouth daily. Take 1-2 tablets    sertraline (ZOLOFT) 50 MG tablet Take 1 tablet by mouth daily.    ACCU-CHEK GUIDE test  strip 1 (ONE) EACH DAILY AND AS NEEDED FOR SYMPTOMS    Accu-Chek Softclix Lancets lancets USE 1 LANCET DAILY AS DIRECTED    albuterol (VENTOLIN HFA) 108 (90 Base) MCG/ACT inhaler Inhale 2 puffs into the lungs every 6 (six) hours as needed for wheezing or shortness of breath.    anastrozole (ARIMIDEX) 1 MG tablet TAKE 1 TABLET BY MOUTH EVERY DAY TO PREVENT FUTURE DISEASE RECURRENCE    BD PEN NEEDLE NANO 2ND GEN 32G X 4 MM MISC 1 (ONE) PEN NEEDLE USE DAILY    Blood Glucose Monitoring Suppl (ACCU-CHEK GUIDE ME) w/Device KIT 1 (ONE) KIT CHECK BLOOD GLUCOSE DAILY    BREZTRI AEROSPHERE 160-9-4.8 MCG/ACT AERO  Inhale 2 puffs into the lungs in the morning and at bedtime.    cyclobenzaprine (FLEXERIL) 10 MG tablet Take 1 tablet by mouth 2 (two) times daily as needed.    diclofenac Sodium (VOLTAREN) 1 % GEL Apply topically.    DULoxetine (CYMBALTA) 30 MG capsule Take 30 mg by mouth daily.    FARXIGA 10 MG TABS tablet Take 10 mg by mouth every morning.    fluconazole (DIFLUCAN) 200 MG tablet Take 200 mg by mouth daily.    glucose blood (ACCU-CHEK GUIDE) test strip 1 (ONE) EACH DAILY AND AS NEEDED FOR SYMPTOMS    hydrochlorothiazide (HYDRODIURIL) 12.5 MG tablet Take by mouth.    lactulose, encephalopathy, (CHRONULAC) 10 GM/15ML SOLN Take 15 mLs (10 g total) by mouth daily. 12/29/2021: Patient takes 45 MLs when taking pain medication   naloxone (NARCAN) nasal spray 4 mg/0.1 mL Use as directed for overdose of narcotic medication    prochlorperazine (COMPAZINE) 10 MG tablet Take 1 tablet (10 mg total) by mouth every 6 (six) hours as needed for nausea or vomiting.    No facility-administered encounter medications on file as of 01/24/2022.    Allergies:  Allergies  Allergen Reactions   Molnupiravir Nausea And Vomiting    Family History: Family History  Problem Relation Age of Onset   Diabetes Mother    Cancer Mother        Breast Cancer   Diabetes Father    Heart disease Father    Heart Problems Father    Diabetes Sister    Breast cancer Sister    Heart disease Brother    Seizures Brother    Heart disease Brother     Social History: Social History   Tobacco Use   Smoking status: Never   Smokeless tobacco: Never  Substance Use Topics   Alcohol use: Not Currently   Drug use: Never   Social History   Social History Narrative   Right Handed    Lives in a one story home    Drinks Coffee 8oz in the morning    Vital Signs:  Ht _0  (1.626 m)   Wt 194 lb (88 kg)   LMP 08/12/2020 (LMP Unknown)   BMI 33.30 kg/m   Neurological Exam: MENTAL STATUS including orientation to time,  place, person, recent and remote memory, attention span and concentration, language, and fund of knowledge is fairly intact.  Speech is not dysarthric.  CRANIAL NERVES: II:  Patient reports subjective vision loss, able to perceive light and and gross movement bilaterally,  III-IV-VI: Pupils equal round and reactive to light.  Normal conjugate, extra-ocular eye movements in all directions of gaze.  She voluntarily flutters her eyelids and gazes upwards during testing.  No nystagmus.  No ptosis.   V:  Normal facial sensation.  VII:  Normal facial symmetry and movements.   VIII:  Normal hearing and vestibular function.   IX-X:  Normal palatal movement.   XI:  Normal shoulder shrug and head rotation.   XII:  Normal tongue strength and range of motion, no deviation or fasciculation.  MOTOR:  Give-way weakness throughout, all muscle groups are antigravity. Pain out of proportion with light tactile stimuli, such as motor testing and reflex testing. No atrophy, fasciculations or abnormal movements.  No pronator drift.   MSRs:  Right        Left                  brachioradialis 2+  2+  biceps 2+  2+  triceps 2+  2+  patellar 2+  2+  ankle jerk 0  0  plantar response down  down   SENSORY:  Patient reports absence of all sensory modalities diffusely throughout arms and legs  COORDINATION/GAIT: Normal finger-to- nose-finger.  Nonphysiologic pattern of finger tapping.  Gait appears slow, antalgic, assisted with cane.   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 There is an overlay of whole body pain, which extends beyond what I is expected for chemotherapy-induced neuropathy. Her exam is nonphysiologic making it difficult to determine actual degree of nerve involvement.  I had extensive discussion with the patient regarding the pathogenesis, etiology, management, and natural course of neuropathy.  NCS/EMG of the arm and leg was declined and I do not  think she would tolerate it based on her sensitivity to tactile stimuli during neurological exam testing At this point, management is supportive. She is seeing pain management for symptom control and currently on Lyrica 14m BID, Cymbalta 37md, and oxycodone.   Patient educated on daily foot inspection, fall prevention, and safety precautions around the home. All questions answered  Return to clinic as needed   Thank you for allowing me to participate in patient's care.  If I can answer any additional questions, I would be pleased to do so.    Sincerely,    Ruth White

## 2022-01-24 ENCOUNTER — Ambulatory Visit (INDEPENDENT_AMBULATORY_CARE_PROVIDER_SITE_OTHER): Payer: Managed Care, Other (non HMO) | Admitting: Neurology

## 2022-01-24 ENCOUNTER — Encounter: Payer: Self-pay | Admitting: Neurology

## 2022-01-24 VITALS — BP 122/78 | HR 74 | Ht 64.0 in | Wt 194.0 lb

## 2022-01-24 DIAGNOSIS — T451X5A Adverse effect of antineoplastic and immunosuppressive drugs, initial encounter: Secondary | ICD-10-CM | POA: Diagnosis not present

## 2022-01-24 DIAGNOSIS — G620 Drug-induced polyneuropathy: Secondary | ICD-10-CM

## 2022-01-30 ENCOUNTER — Encounter: Payer: Self-pay | Admitting: Pharmacist

## 2022-01-30 MED ORDER — MOUNJARO 2.5 MG/0.5ML ~~LOC~~ SOAJ: SUBCUTANEOUS | 1 refills | Status: DC

## 2022-01-30 NOTE — Telephone Encounter (Signed)
This encounter was created in error - please disregard.

## 2022-01-31 ENCOUNTER — Telehealth: Payer: Self-pay | Admitting: Cardiovascular Disease

## 2022-01-31 ENCOUNTER — Telehealth: Payer: Self-pay | Admitting: Pulmonary Disease

## 2022-01-31 DIAGNOSIS — I700 Atherosclerosis of aorta: Secondary | ICD-10-CM

## 2022-01-31 DIAGNOSIS — E7800 Pure hypercholesterolemia, unspecified: Secondary | ICD-10-CM

## 2022-01-31 NOTE — Telephone Encounter (Signed)
When fax is received, this will be given to Dr. Loanne Drilling to fill out.

## 2022-01-31 NOTE — Telephone Encounter (Signed)
Pt c/o medication issue:  1. Name of Medication: Repatha   2. How are you currently taking this medication (dosage and times per day)?    3. Are you having a reaction (difficulty breathing--STAT)? no  4. What is your medication issue? Received a letter from the insurance and wanted to go over it with you.

## 2022-01-31 NOTE — Telephone Encounter (Signed)
Spoke with patient and advised that PA was denied and has been resubmitted. Also advised that PA for [facility] was approved

## 2022-02-02 MED ORDER — REPATHA SURECLICK 140 MG/ML ~~LOC~~ SOAJ: 1.0000 mL | SUBCUTANEOUS | 11 refills | Status: DC

## 2022-02-02 NOTE — Telephone Encounter (Signed)
Repatha PA approved through 02/02/23

## 2022-02-02 NOTE — Addendum Note (Signed)
Addended by: Rollen Sox on: 02/02/2022 03:04 PM   Modules accepted: Orders

## 2022-02-05 NOTE — Telephone Encounter (Signed)
Please contact patient and let them know that we have not received any faxes regarding pulmonary rehab.  Please inquire facility name, address and fax number so we can send referral to desired location.

## 2022-02-06 NOTE — Telephone Encounter (Signed)
Spk with patient states she there's a spot open for her at the end of the month for [place]. Called [facility] Left message on rehab voice with office fax number attn Dr. Loanne Drilling  Nothing further

## 2022-02-06 NOTE — Telephone Encounter (Signed)
Faxed received.

## 2022-02-09 NOTE — Telephone Encounter (Signed)
[facility] form completed and signed. Please update patient.

## 2022-02-09 NOTE — Telephone Encounter (Signed)
Spk to patient informed her paperwork signed and faxed back  Nothing further

## 2022-02-20 DIAGNOSIS — Z139 Encounter for screening, unspecified: Secondary | ICD-10-CM | POA: Diagnosis not present

## 2022-02-20 DIAGNOSIS — Z8639 Personal history of other endocrine, nutritional and metabolic disease: Secondary | ICD-10-CM | POA: Diagnosis not present

## 2022-02-20 DIAGNOSIS — N182 Chronic kidney disease, stage 2 (mild): Secondary | ICD-10-CM | POA: Diagnosis not present

## 2022-02-20 DIAGNOSIS — E1122 Type 2 diabetes mellitus with diabetic chronic kidney disease: Secondary | ICD-10-CM | POA: Diagnosis not present

## 2022-02-20 DIAGNOSIS — I129 Hypertensive chronic kidney disease with stage 1 through stage 4 chronic kidney disease, or unspecified chronic kidney disease: Secondary | ICD-10-CM | POA: Diagnosis not present

## 2022-02-26 ENCOUNTER — Ambulatory Visit: Payer: Managed Care, Other (non HMO) | Admitting: Neurology

## 2022-02-26 ENCOUNTER — Encounter: Payer: Medicare Other | Attending: Pulmonary Disease | Admitting: *Deleted

## 2022-02-26 ENCOUNTER — Encounter: Payer: Self-pay | Admitting: *Deleted

## 2022-02-26 VITALS — Ht 63.5 in | Wt 186.5 lb

## 2022-02-26 DIAGNOSIS — J449 Chronic obstructive pulmonary disease, unspecified: Secondary | ICD-10-CM | POA: Diagnosis not present

## 2022-02-26 NOTE — Progress Notes (Signed)
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 Preferences Skilled Demonstration;Verbal Instruction             General Pulmonary Education Topics:  Infection Prevention: - Provides verbal and written material to individual with discussion of infection control including proper hand washing and proper equipment cleaning during exercise session. Flowsheet Row Pulmonary Rehab from 02/26/2022 in [facility] Cardiac and Pulmonary Rehab  Date 02/26/22  Educator [facility]  Instruction Review Code 1- Verbalizes Understanding       Falls Prevention: - Provides verbal and written material to individual with discussion of falls prevention and safety. Flowsheet Row Pulmonary Rehab from 02/26/2022 in [facility] Cardiac and Pulmonary Rehab  Date  02/26/22  Educator [facility]  Instruction Review Code  1- Verbalizes Understanding       Chronic Lung Disease Review: - Group verbal instruction with posters, models, PowerPoint presentations and videos,  to review new updates, new respiratory medications, new advancements in procedures and treatments. Providing information on websites and "800" numbers for continued self-education. Includes information about supplement oxygen, available portable oxygen systems, continuous and intermittent flow rates, oxygen safety, concentrators, and Medicare reimbursement for oxygen. Explanation of Pulmonary Drugs, including class, frequency, complications, importance of spacers, rinsing mouth after steroid MDI's, and proper cleaning methods for nebulizers. Review of basic lung anatomy and physiology related to function, structure, and complications of lung disease. Review of risk factors. Discussion about methods for diagnosing sleep apnea and types of masks and machines for OSA. Includes a review of the use of types of environmental controls: home humidity, furnaces, filters, dust mite/pet prevention, HEPA vacuums. Discussion about weather changes, air quality and the benefits of nasal washing. Instruction on Warning signs, infection symptoms, calling MD promptly, preventive modes, and value of vaccinations. Review of effective airway clearance, coughing and/or vibration techniques. Emphasizing that all should Create an Action Plan. Written material given at graduation. Flowsheet Row Pulmonary Rehab from 02/26/2022 in [facility] Cardiac and Pulmonary Rehab  Education need identified 02/26/22       AED/CPR: - Group verbal and written instruction with the use of models to demonstrate the basic use of the AED with the basic ABC's of resuscitation.    Anatomy and Cardiac Procedures: - Group verbal and visual presentation and models provide information about basic cardiac anatomy and function. Reviews the testing methods done  to diagnose heart disease and the outcomes of the test results. Describes the treatment choices: Medical Management, Angioplasty, or Coronary Bypass Surgery for treating various heart conditions including Myocardial Infarction, Angina, Valve Disease, and Cardiac Arrhythmias.  Written material given at graduation.   Medication Safety: - Group verbal and visual instruction to review commonly prescribed medications for heart and lung disease. Reviews the medication, class of the drug, and side effects. Includes the steps to properly store meds and maintain the prescription regimen.  Written material given at graduation.   Other: -Provides group and verbal instruction on various topics (see comments)   Knowledge Questionnaire Score:  Knowledge Questionnaire Score - 02/26/22 1204       Knowledge Questionnaire Score   Pre Score 12/18              Core Components/Risk Factors/Patient Goals at Admission:  Personal Goals and Risk Factors at Admission - 02/26/22 0751       Core Components/Risk Factors/Patient Goals on Admission    Weight Management Yes;Weight Loss;Obesity    Intervention Weight Management: Develop a combined nutrition and exercise program designed to reach desired caloric intake, while maintaining appropriate intake of nutrient and fiber, sodium and fats, and appropriate energy expenditure required for the weight goal.;Weight Management: Provide education and appropriate resources to help participant work on and attain dietary goals.;Weight Management/Obesity: Establish reasonable short term and long term weight goals.;Obesity: Provide education and appropriate resources to help participant work on and attain dietary goals.    Admit Weight 186 lb 8 oz (84.6 kg)    Goal Weight: Short Term 182 lb (82.6 kg)    Goal Weight: Long Term 180 lb (81.6 kg)    Expected Outcomes Short Term: Continue to assess and modify interventions until short term weight is achieved;Long Term:  Adherence to nutrition and physical activity/exercise program aimed toward attainment of established weight goal;Weight Loss: Understanding  of general recommendations for a balanced deficit meal plan, which promotes 1-2 lb weight loss per week and includes a negative energy balance of [phone] kcal/d;Understanding recommendations for meals to include 15-35% energy as protein, 25-35% energy from fat, 35-60% energy from carbohydrates, less than 256m of dietary cholesterol, 20-35 gm of total fiber daily;Understanding of distribution of calorie intake throughout the day with the consumption of 4-5 meals/snacks    Improve shortness of breath with ADL's Yes    Intervention Provide education, individualized exercise plan and daily activity instruction to help decrease symptoms of SOB with activities of daily living.    Expected Outcomes Short Term: Improve cardiorespiratory fitness to achieve a reduction of symptoms when performing ADLs;Long Term: Be able to perform more ADLs without symptoms or delay the onset of symptoms    Increase knowledge of respiratory medications and ability to use respiratory devices properly  Yes    Intervention Provide education and demonstration as needed of appropriate use of medications, inhalers, and oxygen therapy.    Expected Outcomes Short Term: Achieves understanding of medications use. Understands that oxygen is a medication prescribed by physician. Demonstrates appropriate use of inhaler and oxygen therapy.;Long Term: Maintain appropriate use of medications, inhalers, and oxygen therapy.    Diabetes Yes    Intervention Provide education about signs/symptoms and action to take for hypo/hyperglycemia.;Provide education about proper nutrition, including hydration, and aerobic/resistive exercise prescription along with prescribed medications to achieve blood glucose in normal ranges: Fasting glucose 65-99 mg/dL    Expected Outcomes Short Term: Participant verbalizes understanding  of the signs/symptoms and immediate care of hyper/hypoglycemia, proper foot care and importance of medication, aerobic/resistive exercise and nutrition plan for blood glucose control.;Long Term: Attainment of HbA1C < 7%.    Hypertension Yes    Intervention Provide education on lifestyle modifcations including regular physical activity/exercise, weight management, moderate sodium restriction and increased consumption of fresh fruit, vegetables, and low fat dairy, alcohol moderation, and smoking cessation.;Monitor prescription use compliance.    Expected Outcomes Short Term: Continued assessment and intervention until BP is < 140/942mHG in hypertensive participants. < 130/8020mG in hypertensive participants with diabetes, heart failure or chronic kidney disease.;Long Term: Maintenance of blood pressure at goal levels.    Lipids Yes    Intervention Provide education and support for participant on nutrition & aerobic/resistive exercise along with prescribed medications to achieve LDL <54m14mDL >40mg47m Expected Outcomes Short Term: Participant states understanding of desired cholesterol values and is compliant with medications prescribed. Participant is following exercise prescription and nutrition guidelines.;Long Term: Cholesterol controlled with medications as prescribed, with individualized exercise RX and with personalized nutrition plan. Value goals: LDL < 54mg,48m > 40 mg.             Education:Diabetes - Individual verbal and written instruction to review signs/symptoms of diabetes, desired ranges of glucose level fasting, after meals and with exercise. Acknowledge that pre and post exercise glucose checks will be done for 3 sessions at entry of program. Flowsheet Row [facility]  Date 02/26/22  [facility] Code 1- Verbalizes Understanding       Know Your Numbers and Heart Failure: - Group verbal and visual  instruction to discuss disease risk factors for cardiac and pulmonary disease and treatment options.  Reviews associated critical values for Overweight/Obesity, Hypertension, Cholesterol, and Diabetes.  Discusses basics of heart failure: signs/symptoms and treatments.  Introduces Heart Failure Zone chart  for action plan for heart failure.  Written material given at graduation.   Core Components/Risk Factors/Patient Goals Review:    Core Components/Risk Factors/Patient Goals at Discharge (Final Review):    ITP Comments:  ITP Comments     Row Name 02/26/22 1155           ITP Comments Completed 6MWT and gym orientation. Initial ITP created and sent for review to Dr. Zetta Bills, Medical Director.  Documentation for diagnosis can be found in [facility] encounter 01/12/22.                Comments: Initial ITP

## 2022-02-26 NOTE — Patient Instructions (Addendum)
Patient Instructions  Patient Details  Name: Ruth White MRN: [id number] Date of Birth: [birth year] Referring Provider:  Margaretha Seeds, MD  Below are your personal goals for exercise, nutrition, and risk factors. Our goal is to help you stay on track towards obtaining and maintaining these goals. We will be discussing your progress on these goals with you throughout the program.  Initial Exercise Prescription:  Initial Exercise Prescription - 02/26/22 1100       Date of Initial Exercise RX and Referring Provider   Date 02/26/22    Referring Provider Margaretha Seeds MD      Oxygen   Oxygen Continuous    Liters 2    Maintain Oxygen Saturation 88% or higher      Treadmill   MPH 0.8    Grade 0    Minutes 15    METs 1.6      Recumbant Bike   Level 1    RPM 50    Watts 10    Minutes 15    METs 1.5      NuStep   Level 1    SPM 60    Minutes 15    METs 1.5      T5 Nustep   Level 1    SPM 60    Minutes 15    METs 1.5      Biostep-RELP   Level 1    SPM 40    Minutes 15    METs 2      Track   Laps 14    Minutes 15    METs 1.76      Prescription Details   Frequency (times per week) 2    Duration Progress to 30 minutes of continuous aerobic without signs/symptoms of physical distress      Intensity   THRR 40-80% of Max Heartrate 114-145    Ratings of Perceived Exertion 11-13    Perceived Dyspnea 0-4      Progression   Progression Continue to progress workloads to maintain intensity without signs/symptoms of physical distress.      Resistance Training   Training Prescription Yes    Weight 2 lb    Reps 10-15             Exercise Goals: Frequency: Be able to perform aerobic exercise two to three times per week in program working toward 2-5 days per week of home exercise.  Intensity: Work with a perceived exertion of 11 (fairly light) - 15 (hard) while following your exercise prescription.  We will make changes to your prescription with  you as you progress through the program.   Duration: Be able to do 30 to 45 minutes of continuous aerobic exercise in addition to a 5 minute warm-up and a 5 minute cool-down routine.   Nutrition Goals: Your personal nutrition goals will be established when you do your nutrition analysis with the dietician.  The following are general nutrition guidelines to follow: Cholesterol < '200mg'$ /day Sodium < '1500mg'$ /day Fiber: Women over 50 yrs - 21 grams per day  Personal Goals:  Personal Goals and Risk Factors at Admission - 02/26/22 0751       Core Components/Risk Factors/Patient Goals on Admission    Weight Management Yes;Weight Loss;Obesity    Intervention Weight Management: Develop a combined nutrition and exercise program designed to reach desired caloric intake, while maintaining appropriate intake of nutrient and fiber, sodium and fats, and appropriate energy expenditure required for the weight goal.;Weight Management:  Provide education and appropriate resources to help participant work on and attain dietary goals.;Weight Management/Obesity: Establish reasonable short term and long term weight goals.;Obesity: Provide education and appropriate resources to help participant work on and attain dietary goals.    Admit Weight 186 lb 8 oz (84.6 kg)    Goal Weight: Short Term 182 lb (82.6 kg)    Goal Weight: Long Term 180 lb (81.6 kg)    Expected Outcomes Short Term: Continue to assess and modify interventions until short term weight is achieved;Long Term: Adherence to nutrition and physical activity/exercise program aimed toward attainment of established weight goal;Weight Loss: Understanding of general recommendations for a balanced deficit meal plan, which promotes 1-2 lb weight loss per week and includes a negative energy balance of [phone] kcal/d;Understanding recommendations for meals to include 15-35% energy as protein, 25-35% energy from fat, 35-60% energy from carbohydrates, less than '200mg'$  of  dietary cholesterol, 20-35 gm of total fiber daily;Understanding of distribution of calorie intake throughout the day with the consumption of 4-5 meals/snacks    Improve shortness of breath with ADL's Yes    Intervention Provide education, individualized exercise plan and daily activity instruction to help decrease symptoms of SOB with activities of daily living.    Expected Outcomes Short Term: Improve cardiorespiratory fitness to achieve a reduction of symptoms when performing ADLs;Long Term: Be able to perform more ADLs without symptoms or delay the onset of symptoms    Increase knowledge of respiratory medications and ability to use respiratory devices properly  Yes    Intervention Provide education and demonstration as needed of appropriate use of medications, inhalers, and oxygen therapy.    Expected Outcomes Short Term: Achieves understanding of medications use. Understands that oxygen is a medication prescribed by physician. Demonstrates appropriate use of inhaler and oxygen therapy.;Long Term: Maintain appropriate use of medications, inhalers, and oxygen therapy.    Diabetes Yes    Intervention Provide education about signs/symptoms and action to take for hypo/hyperglycemia.;Provide education about proper nutrition, including hydration, and aerobic/resistive exercise prescription along with prescribed medications to achieve blood glucose in normal ranges: Fasting glucose 65-99 mg/dL    Expected Outcomes Short Term: Participant verbalizes understanding of the signs/symptoms and immediate care of hyper/hypoglycemia, proper foot care and importance of medication, aerobic/resistive exercise and nutrition plan for blood glucose control.;Long Term: Attainment of HbA1C < 7%.    Hypertension Yes    Intervention Provide education on lifestyle modifcations including regular physical activity/exercise, weight management, moderate sodium restriction and increased consumption of fresh fruit, vegetables, and  low fat dairy, alcohol moderation, and smoking cessation.;Monitor prescription use compliance.    Expected Outcomes Short Term: Continued assessment and intervention until BP is < 140/34m HG in hypertensive participants. < 130/829mHG in hypertensive participants with diabetes, heart failure or chronic kidney disease.;Long Term: Maintenance of blood pressure at goal levels.    Lipids Yes    Intervention Provide education and support for participant on nutrition & aerobic/resistive exercise along with prescribed medications to achieve LDL '70mg'$ , HDL >'40mg'$ .    Expected Outcomes Short Term: Participant states understanding of desired cholesterol values and is compliant with medications prescribed. Participant is following exercise prescription and nutrition guidelines.;Long Term: Cholesterol controlled with medications as prescribed, with individualized exercise RX and with personalized nutrition plan. Value goals: LDL < '70mg'$ , HDL > 40 mg.             Tobacco Use Initial Evaluation: Social History   Tobacco Use  Smoking Status Never  Smokeless  Tobacco Never    Exercise Goals and Review:  Exercise Goals     Row Name 02/26/22 1202             Exercise Goals   Increase Physical Activity Yes       Intervention Provide advice, education, support and counseling about physical activity/exercise needs.;Develop an individualized exercise prescription for aerobic and resistive training based on initial evaluation findings, risk stratification, comorbidities and participant's personal goals.       Expected Outcomes Short Term: Attend rehab on a regular basis to increase amount of physical activity.;Long Term: Add in home exercise to make exercise part of routine and to increase amount of physical activity.;Long Term: Exercising regularly at least 3-5 days a week.       Increase Strength and Stamina Yes       Intervention Provide advice, education, support and counseling about physical  activity/exercise needs.;Develop an individualized exercise prescription for aerobic and resistive training based on initial evaluation findings, risk stratification, comorbidities and participant's personal goals.       Expected Outcomes Short Term: Increase workloads from initial exercise prescription for resistance, speed, and METs.;Short Term: Perform resistance training exercises routinely during rehab and add in resistance training at home;Long Term: Improve cardiorespiratory fitness, muscular endurance and strength as measured by increased METs and functional capacity (6MWT)       Able to understand and use rate of perceived exertion (RPE) scale Yes       Intervention Provide education and explanation on how to use RPE scale       Expected Outcomes Short Term: Able to use RPE daily in rehab to express subjective intensity level;Long Term:  Able to use RPE to guide intensity level when exercising independently       Able to understand and use Dyspnea scale Yes       Intervention Provide education and explanation on how to use Dyspnea scale       Expected Outcomes Short Term: Able to use Dyspnea scale daily in rehab to express subjective sense of shortness of breath during exertion;Long Term: Able to use Dyspnea scale to guide intensity level when exercising independently       Knowledge and understanding of Target Heart Rate Range (THRR) Yes       Intervention Provide education and explanation of THRR including how the numbers were predicted and where they are located for reference       Expected Outcomes Short Term: Able to state/look up THRR;Short Term: Able to use daily as guideline for intensity in rehab;Long Term: Able to use THRR to govern intensity when exercising independently       Able to check pulse independently Yes       Intervention Provide education and demonstration on how to check pulse in carotid and radial arteries.;Review the importance of being able to check your own pulse for  safety during independent exercise       Expected Outcomes Long Term: Able to check pulse independently and accurately;Short Term: Able to explain why pulse checking is important during independent exercise       Understanding of Exercise Prescription Yes       Intervention Provide education, explanation, and written materials on patient's individual exercise prescription       Expected Outcomes Short Term: Able to explain program exercise prescription;Long Term: Able to explain home exercise prescription to exercise independently                Copy  of goals given to participant.

## 2022-03-01 DIAGNOSIS — J449 Chronic obstructive pulmonary disease, unspecified: Secondary | ICD-10-CM

## 2022-03-01 LAB — GLUCOSE, CAPILLARY
Glucose-Capillary: 142 mg/dL — ABNORMAL HIGH (ref 70–99)
Glucose-Capillary: 140 mg/dL — ABNORMAL HIGH (ref 70–99)

## 2022-03-01 NOTE — Progress Notes (Signed)
Daily Session Note  Patient Details  Name: Ruth White MRN: [id number] Date of Birth: 1961/06/07 Referring Provider:   Flowsheet Row Pulmonary Rehab from 02/26/2022 in [facility] Cardiac and Pulmonary Rehab  Referring Provider Margaretha Seeds MD       Encounter Date: 03/01/2022  Check In:  Session Check In - 03/01/22 0856       Check-In   Supervising physician immediately available to respond to emergencies See telemetry face sheet for immediately available ER MD    Location [facility]-Cardiac & Pulmonary Rehab    Staff Present Justin Mend, RCP,RRT,BSRT;Heath Lark, RN, BSN, Laveda Norman, BS, ACSM CEP, Exercise Physiologist    Virtual Visit No    Medication changes reported     No    Fall or balance concerns reported    No    Warm-up and Cool-down Performed on first and last piece of equipment    Resistance Training Performed Yes    VAD Patient? No    PAD/SET Patient? No      Pain Assessment   Currently in Pain? No/denies    Multiple Pain Sites No                Social History   Tobacco Use  Smoking Status Never  Smokeless Tobacco Never    Goals Met:  Proper associated with RPD/PD & O2 Sat Exercise tolerated well Personal goals reviewed Queuing for purse lip breathing No report of concerns or symptoms today Strength training completed today  Goals Unmet:  Not Applicable  Comments: First full day of exercise!  Patient was oriented to gym and equipment including functions, settings, policies, and procedures.  Patient's individual exercise prescription and treatment plan were reviewed.  All starting workloads were established based on the results of the 6 minute walk test done at initial orientation visit.  The plan for exercise progression was also introduced and progression will be customized based on patient's performance and goals.   Pt able to follow exercise prescription today without complaint.  Will continue to monitor for progression.    Dr.  Emily Filbert is Medical Director for [place].  Dr. Ottie Glazier is Medical Director for [facility] Pulmonary Rehabilitation.

## 2022-03-02 DIAGNOSIS — R0602 Shortness of breath: Secondary | ICD-10-CM | POA: Diagnosis not present

## 2022-03-02 DIAGNOSIS — J449 Chronic obstructive pulmonary disease, unspecified: Secondary | ICD-10-CM | POA: Diagnosis not present

## 2022-03-02 DIAGNOSIS — J9611 Chronic respiratory failure with hypoxia: Secondary | ICD-10-CM | POA: Diagnosis not present

## 2022-03-06 DIAGNOSIS — G894 Chronic pain syndrome: Secondary | ICD-10-CM | POA: Diagnosis not present

## 2022-03-07 ENCOUNTER — Telehealth: Payer: Self-pay | Admitting: Cardiovascular Disease

## 2022-03-07 ENCOUNTER — Encounter: Payer: Medicare Other | Admitting: *Deleted

## 2022-03-07 DIAGNOSIS — J449 Chronic obstructive pulmonary disease, unspecified: Secondary | ICD-10-CM

## 2022-03-07 NOTE — Telephone Encounter (Signed)
PA started (Key: [id number]) Waiting on questions.

## 2022-03-07 NOTE — Telephone Encounter (Signed)
Verified with patient that she needs prior auth for repatha.

## 2022-03-07 NOTE — Telephone Encounter (Signed)
PA submitted.

## 2022-03-07 NOTE — Progress Notes (Signed)
Daily Session Note  Patient Details  Name: Ruth White MRN: [id number] Date of Birth: 02/07/1961 Referring Provider:   Flowsheet Row Pulmonary Rehab from 02/26/2022 in [facility] Cardiac and Pulmonary Rehab  Referring Provider Margaretha Seeds MD       Encounter Date: 03/07/2022  Check In:  Session Check In - 03/07/22 0813       Check-In   Supervising physician immediately available to respond to emergencies See telemetry face sheet for immediately available ER MD    Location [facility]-Cardiac & Pulmonary Rehab    Staff Present Heath Lark, RN, BSN, CCRP;Joseph Corinth, RCP,RRT,BSRT;Noah Tickle, [place], Exercise Physiologist;Other   Darlyne Russian RN   Virtual Visit No    Medication changes reported     No    Fall or balance concerns reported    No    Warm-up and Cool-down Performed on first and last piece of equipment    Resistance Training Performed Yes    VAD Patient? No    PAD/SET Patient? No      Pain Assessment   Currently in Pain? No/denies                Social History   Tobacco Use  Smoking Status Never  Smokeless Tobacco Never    Goals Met:  Proper associated with RPD/PD & O2 Sat Independence with exercise equipment Exercise tolerated well Strength training completed today  Goals Unmet:  Not Applicable  Comments: Pt able to follow exercise prescription today without complaint.  Will continue to monitor for progression.    Dr. Emily Filbert is Medical Director for [place].  Dr. Ottie Glazier is Medical Director for [facility] Pulmonary Rehabilitation.

## 2022-03-07 NOTE — Telephone Encounter (Signed)
Pt c/o medication issue:  1. Name of Medication:Evolocumab (REPATHA SURECLICK) 233 MG/ML SOAJ   2. How are you currently taking this medication (dosage and times per day)?   3. Are you having a reaction (difficulty breathing--STAT)?   4. What is your medication issue? Pt states that insurance needs a letter explaining why pt needs this medication.  [place]  Fax # [phone] Tele # [phone] So a form can be faxed.

## 2022-03-08 NOTE — Telephone Encounter (Signed)
PA has been approved through 09/05/22. Patient made aware of approval.

## 2022-03-11 DIAGNOSIS — R300 Dysuria: Secondary | ICD-10-CM | POA: Diagnosis not present

## 2022-03-11 DIAGNOSIS — N390 Urinary tract infection, site not specified: Secondary | ICD-10-CM | POA: Diagnosis not present

## 2022-03-14 ENCOUNTER — Ambulatory Visit: Payer: Managed Care, Other (non HMO) | Admitting: Oncology

## 2022-03-14 DIAGNOSIS — R110 Nausea: Secondary | ICD-10-CM | POA: Diagnosis not present

## 2022-03-14 DIAGNOSIS — R82998 Other abnormal findings in urine: Secondary | ICD-10-CM | POA: Diagnosis not present

## 2022-03-14 DIAGNOSIS — N12 Tubulo-interstitial nephritis, not specified as acute or chronic: Secondary | ICD-10-CM | POA: Diagnosis not present

## 2022-03-14 DIAGNOSIS — Z7689 Persons encountering health services in other specified circumstances: Secondary | ICD-10-CM | POA: Diagnosis not present

## 2022-03-14 DIAGNOSIS — R300 Dysuria: Secondary | ICD-10-CM | POA: Diagnosis not present

## 2022-03-19 DIAGNOSIS — H538 Other visual disturbances: Secondary | ICD-10-CM | POA: Diagnosis not present

## 2022-03-19 DIAGNOSIS — R200 Anesthesia of skin: Secondary | ICD-10-CM | POA: Diagnosis not present

## 2022-03-19 DIAGNOSIS — H5461 Unqualified visual loss, right eye, normal vision left eye: Secondary | ICD-10-CM | POA: Diagnosis not present

## 2022-03-20 ENCOUNTER — Ambulatory Visit (INDEPENDENT_AMBULATORY_CARE_PROVIDER_SITE_OTHER): Payer: Medicare Other | Admitting: Pulmonary Disease

## 2022-03-20 ENCOUNTER — Encounter: Payer: Self-pay | Admitting: Pulmonary Disease

## 2022-03-20 VITALS — BP 108/76 | HR 98 | Ht 64.0 in | Wt 182.0 lb

## 2022-03-20 DIAGNOSIS — J9611 Chronic respiratory failure with hypoxia: Secondary | ICD-10-CM

## 2022-03-20 DIAGNOSIS — R0602 Shortness of breath: Secondary | ICD-10-CM | POA: Diagnosis not present

## 2022-03-20 DIAGNOSIS — J449 Chronic obstructive pulmonary disease, unspecified: Secondary | ICD-10-CM

## 2022-03-20 NOTE — Progress Notes (Addendum)
Subjective:   PATIENT ID: [name] GENDER: female DOB: January 30, 1961, MRN: [id number]   HPI  Chief Complaint  Patient presents with   Follow-up    Still sob when rushing   Reason for Visit: Follow-up  [name] is a 61 year old female never smoker with history of multifocal stage Ia breast cancer status post lumpectomy 07/2019 and radiation on adjuvant therapy, peripheral neuropathy, depression who presents for follow-up  Initial Consult 07/10/21 She is referred by her PCP. Note reviewed from 06/14/21. She reports shortness of breath after completing chemotherapy in August/September. Associated with coughing. Occasional wheezing. No chest congestion. Occurs with any exertion including with walking, bathing and talking. Bending down is difficult for her due to dizziness. She had a negative CXR in the fall and normal SpO2. She has tried on Trelegy for one week but unable to take due to feeling choked up. She was switched to [facility] and has been compliant for the last month. She reports partial improvement. Before her cancer treatment two years ago she is extremely active and functional.  08/22/21 She presented for PFT evaluation. Since our last visit she has been taking Breztri with partial improvement ~30%. Her shortness of breath and coughing has improved. Occasional wheezing. She is walking two laps around the yard. Husband is present and reports she is more active.  11/27/21 Husband present and provides additional history with patient. She is compliant with Breztri twice daily. She takes albuterol once a day. She continues to shortness of breath. Occasional deep wheeze and cough with exertion. Not currently in Pulmonary Rehab due to frequent doctor visits and fatigue. She was recently hospitalized [facility] (5/4-10/24/21) for colitis requiring antibiotics (amoxicillin). Denies needing vancomycin. She walking around with the walker at the house daily but has low energy  at times.   01/12/22 Husband present visit and provides additional history.  Since our last visit she has had shortness of breath. Trying to be more active walking 4000-5000 steps a day. Has some wheezing and coughing. Compliant with Breztri. She will use her albuterol 3-4 times a day, usually when she isn't wearing oxygen. She reports on room air that her pulse oximetry will read as low as 91%.  She was previously referred to pulmonary rehab but states that she is unable to make it to classes 3 days a week due to transportation issues.  03/20/22 Since our last visit she has been compliant on Breztri. She has started on Pulmonary Rehab and wears 2L. She is starting to walk more. Still feels short of breath when rushed. Still has some wheezing and coughing. Uses albuterol 2-3 times a day with activity. Has some symptoms at night. 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 8 g 2   anastrozole (ARIMIDEX) 1 MG tablet TAKE 1 TABLET BY MOUTH EVERY DAY TO PREVENT FUTURE DISEASE RECURRENCE 90 tablet 3   aspirin 325 MG EC tablet Take 325 mg by mouth daily.     atenolol (TENORMIN) 50 MG tablet Take 50  mg by mouth daily.     Blood Glucose Monitoring Suppl (ACCU-CHEK GUIDE ME) w/Device KIT 1 (ONE) KIT CHECK BLOOD GLUCOSE DAILY     BREZTRI AEROSPHERE 160-9-4.8 MCG/ACT AERO Inhale 2 puffs into the lungs in the morning and at bedtime. 10.7 g 5   diclofenac Sodium (VOLTAREN) 1 % GEL Apply topically.     Evolocumab (REPATHA SURECLICK) 856 MG/ML SOAJ Inject 1 mL into the skin every 14 (fourteen) days. 2 mL 11   ezetimibe (ZETIA) 10 MG tablet Take 10 mg by mouth at bedtime.     FARXIGA 10 MG TABS tablet Take 10 mg by mouth every morning.     glucose blood (ACCU-CHEK GUIDE) test strip 1 (ONE) EACH DAILY AND AS NEEDED FOR SYMPTOMS     ibuprofen (ADVIL) 800 MG tablet Take 800 mg by mouth every 8 (eight) hours as needed.     lactulose, encephalopathy, (CHRONULAC) 10 GM/15ML SOLN Take 15 mLs (10 g total) by mouth daily. 473 mL 2   lisinopril (ZESTRIL) 20 MG tablet Take 20 mg by mouth 2 (two) times daily.     magic mouthwash SOLN Take 5 mLs by mouth.     metFORMIN (GLUCOPHAGE-XR) 500 MG 24 hr tablet Take 500 mg by mouth daily.     naloxone (NARCAN) nasal spray 4 mg/0.1 mL Use as directed for overdose of narcotic medication 1 each 2   NUCYNTA 50 MG tablet Take 50 mg by mouth 4 (four) times daily.     ondansetron (ZOFRAN) 4 MG tablet Take 4 mg by mouth every 8 (eight) hours as needed for nausea or vomiting.     prochlorperazine (COMPAZINE) 10 MG tablet Take 1 tablet (10 mg total) by mouth every 6 (six) hours as needed for nausea or vomiting. 30 tablet 1   rosuvastatin (CRESTOR) 40 MG tablet Take 40 mg by mouth daily.     sertraline (ZOLOFT) 50 MG tablet Take 1 tablet by mouth daily.     tirzepatide [facility]) 2.5 MG/0.5ML Pen Inject one pen once a week.  Discontinue Victoza at this time. 2 mL 1   ACCU-CHEK GUIDE test strip 1 (ONE) EACH DAILY AND AS NEEDED FOR SYMPTOMS     BD PEN NEEDLE NANO 2ND GEN 32G X 4 MM MISC 1 (ONE) PEN NEEDLE USE DAILY     cyclobenzaprine (FLEXERIL) 10 MG tablet Take 1 tablet by mouth  2 (two) times daily as needed. (Patient not taking: Reported on 01/24/2022)     DULoxetine (CYMBALTA) 30 MG capsule Take 30 mg by mouth daily. (Patient not taking: Reported on 01/24/2022)     hydrochlorothiazide (HYDRODIURIL) 12.5 MG tablet Take by mouth. (Patient not taking: Reported on 01/24/2022)     Oxycodone HCl 10 MG TABS Take 0.5 tablets (5 mg total) by mouth every 4 (four) hours as needed. 120 tablet 0   pregabalin (LYRICA) 75 MG capsule TAKE 1 CAPSULE BY MOUTH TWICE A DAY 180 capsule 0   senna (SENOKOT) 8.6 MG tablet Take 1 tablet by mouth daily. Take 1-2 tablets (Patient not taking: Reported on 01/24/2022)     fluconazole (DIFLUCAN) 200 MG  tablet Take 200 mg by mouth daily. (Patient not taking: Reported on 01/24/2022)     molnupiravir EUA (LAGEVRIO) 200 MG CAPS capsule Take 4 capsules by mouth 2 (two) times daily. 200 mg capsules     No facility-administered medications prior to visit.    Review of Systems  Constitutional:  Negative for chills, diaphoresis, fever, malaise/fatigue and weight loss.  HENT:  Negative for congestion.   Respiratory:  Positive for shortness of breath. Negative for cough, hemoptysis, sputum production and wheezing.   Cardiovascular:  Negative for chest pain, palpitations and leg swelling.     Objective:   Vitals:   03/20/22 0834  BP: 108/76  Pulse: 98  SpO2: 96%  Weight: 182 lb (82.6 kg)  Height: 5' 4" (1.626 m)   SpO2: 96 % O2 Device: None (Room air)  Physical Exam: General: Well-appearing, no acute distress HENT: [place], AT Eyes: EOMI, no scleral icterus Respiratory: Clear to auscultation bilaterally.  No crackles, wheezing or rales Cardiovascular: RRR, -M/R/G, no JVD Extremities:-Edema,-tenderness Neuro: AAO x4, CNII-XII grossly intact Psych: Normal mood, normal affect  Data Reviewed:  Imaging: CXR 07/10/2021- No infiltrate, effusion or edema. Low lung volumes. CT chest 12/18/2021-overall normal lung parenchyma  PFT: 08/22/21  FVC 1.05 (39%) FEV1  0.67 (31%) Ratio 67   Unable to complete TLC or DLCO due to effort Interpretation: Very severe obstructive defect. No significant bronchodilator response  Labs: BMET from 12/30/2020 reviewed.  No abnormalities.  Normal electrolytes and renal function   Assessment & Plan:   Discussion: 61 year old female with hx of multifocal stage Ia breast cancer s/p lumpectomy 07/2019 and radiation on adjuvant therapy, peripheral neuropathy, depression who presents for follow-up. Dyspnea likely driven by deconditioning and severe COPD. Continues to have symptoms at baseline that respond to albuterol inhaler. Not in exacerbation. Discussed stepping up therapy to increase ICS however this would require changing to two inhalers. Patient would like to hold off for now until she is further along with pulmonary rehab.  Dyspnea on exertion - multifactorial with deconditioning, severe COPD and hx chemotherapy/radiation Severe COPD (non-smoker) Hx of chemotherapy/radiation -- Continue Breztri TWO puffs TWICE a day --CONTINUE Albuterol TWO puffs AS NEEDED for shortness of breath or wheezing. Use before exercise --CONTINUE Pulmonary Rehab --Continue regular aerobic exercise --Discussed vaccinations including influenza, COVID, RSV and pneumococcal   Chronic hypoxemic respiratory failure Exertional hypoxemia --Uses oxygen with pulmonary rehab --CONTINUE supplemental oxygen as needed for goal SpO2 >88%  Health Maintenance  There is no immunization history on file for this patient. CT Lung Screen - never smoker. Not qualified.  No orders of the defined types were placed in this encounter.  No orders of the defined types were placed in this encounter.   Return in about 3 months (around 06/20/2022).  I have spent a total time of 32-minutes on the day of the appointment including chart review, data review, collecting history, coordinating care and discussing medical diagnosis and plan with the patient/family. Past  medical history, allergies, medications were reviewed. Pertinent imaging, labs and tests included in this note have been reviewed and interpreted independently by me.  [place], MD [facility] 03/20/2022  Office Number [phone]

## 2022-03-20 NOTE — Patient Instructions (Addendum)
Dyspnea on exertion - multifactorial with deconditioning, severe COPD and hx chemotherapy/radiation Severe COPD (non-smoker) Hx of chemotherapy/radiation -- Continue Breztri TWO puffs TWICE a day --CONTINUE Albuterol TWO puffs AS NEEDED for shortness of breath or wheezing. Use before exercise --CONTINUE Pulmonary Rehab --Continue regular aerobic exercise  Chronic hypoxemic respiratory failure Exertional hypoxemia --Uses oxygen with pulmonary rehab  Follow-up in 3 months

## 2022-03-21 ENCOUNTER — Encounter: Payer: Self-pay | Admitting: *Deleted

## 2022-03-21 DIAGNOSIS — J449 Chronic obstructive pulmonary disease, unspecified: Secondary | ICD-10-CM

## 2022-03-21 NOTE — Progress Notes (Signed)
Pulmonary Individual Treatment Plan  Patient Details  Name: Ruth White MRN: [id number] Date of Birth: Feb 10, 1961 Referring Provider:   Flowsheet Row Pulmonary Rehab from 02/26/2022 in [facility] Cardiac and Pulmonary Rehab  Referring Provider Margaretha Seeds MD       Initial Encounter Date:  Flowsheet Row Pulmonary Rehab from 02/26/2022 in [facility] Cardiac and Pulmonary Rehab  Date 02/26/22       Visit Diagnosis: Stage 3 severe COPD by GOLD classification ([place])  Patient's Home Medications on Admission:  Current Outpatient Medications:    ACCU-CHEK GUIDE test strip, 1 (ONE) EACH DAILY AND AS NEEDED FOR SYMPTOMS, Disp: , Rfl:    Accu-Chek Softclix Lancets lancets, USE 1 LANCET DAILY AS DIRECTED, Disp: , Rfl:    albuterol (VENTOLIN HFA) 108 (90 Base) MCG/ACT inhaler, Inhale 2 puffs into the lungs every 6 (six) hours as needed for wheezing or shortness of breath., Disp: 8 g, Rfl: 2   anastrozole (ARIMIDEX) 1 MG tablet, TAKE 1 TABLET BY MOUTH EVERY DAY TO PREVENT FUTURE DISEASE RECURRENCE, Disp: 90 tablet, Rfl: 3   aspirin 325 MG EC tablet, Take 325 mg by mouth daily., Disp: , Rfl:    atenolol (TENORMIN) 50 MG tablet, Take 50 mg by mouth daily., Disp: , Rfl:    BD PEN NEEDLE NANO 2ND GEN 32G X 4 MM MISC, 1 (ONE) PEN NEEDLE USE DAILY, Disp: , Rfl:    Blood Glucose Monitoring Suppl (ACCU-CHEK GUIDE ME) w/Device KIT, 1 (ONE) KIT CHECK BLOOD GLUCOSE DAILY, Disp: , Rfl:    BREZTRI AEROSPHERE 160-9-4.8 MCG/ACT AERO, Inhale 2 puffs into the lungs in the morning and at bedtime., Disp: 10.7 g, Rfl: 5   cyclobenzaprine (FLEXERIL) 10 MG tablet, Take 1 tablet by mouth 2 (two) times daily as needed. (Patient not taking: Reported on 01/24/2022), Disp: , Rfl:    diclofenac Sodium (VOLTAREN) 1 % GEL, Apply topically., Disp: , Rfl:    DULoxetine (CYMBALTA) 30 MG capsule, Take 30 mg by mouth daily. (Patient not taking: Reported on 01/24/2022), Disp: , Rfl:    Evolocumab (REPATHA SURECLICK) 527 MG/ML SOAJ,  Inject 1 mL into the skin every 14 (fourteen) days., Disp: 2 mL, Rfl: 11   ezetimibe (ZETIA) 10 MG tablet, Take 10 mg by mouth at bedtime., Disp: , Rfl:    FARXIGA 10 MG TABS tablet, Take 10 mg by mouth every morning., Disp: , Rfl:    glucose blood (ACCU-CHEK GUIDE) test strip, 1 (ONE) EACH DAILY AND AS NEEDED FOR SYMPTOMS, Disp: , Rfl:    hydrochlorothiazide (HYDRODIURIL) 12.5 MG tablet, Take by mouth. (Patient not taking: Reported on 01/24/2022), Disp: , Rfl:    ibuprofen (ADVIL) 800 MG tablet, Take 800 mg by mouth every 8 (eight) hours as needed., Disp: , Rfl:    lactulose, encephalopathy, (CHRONULAC) 10 GM/15ML SOLN, Take 15 mLs (10 g total) by mouth daily., Disp: 473 mL, Rfl: 2   lisinopril (ZESTRIL) 20 MG tablet, Take 20 mg by mouth 2 (two) times daily., Disp: , Rfl:    magic mouthwash SOLN, Take 5 mLs by mouth., Disp: , Rfl:    metFORMIN (GLUCOPHAGE-XR) 500 MG 24 hr tablet, Take 500 mg by mouth daily., Disp: , Rfl:    naloxone (NARCAN) nasal spray 4 mg/0.1 mL, Use as directed for overdose of narcotic medication, Disp: 1 each, Rfl: 2   NUCYNTA 50 MG tablet, Take 50 mg by mouth 4 (four) times daily., Disp: , Rfl:    ondansetron (ZOFRAN) 4 MG tablet, Take  4 mg by mouth every 8 (eight) hours as needed for nausea or vomiting., Disp: , Rfl:    Oxycodone HCl 10 MG TABS, Take 0.5 tablets (5 mg total) by mouth every 4 (four) hours as needed., Disp: 120 tablet, Rfl: 0   pregabalin (LYRICA) 75 MG capsule, TAKE 1 CAPSULE BY MOUTH TWICE A DAY, Disp: 180 capsule, Rfl: 0   prochlorperazine (COMPAZINE) 10 MG tablet, Take 1 tablet (10 mg total) by mouth every 6 (six) hours as needed for nausea or vomiting., Disp: 30 tablet, Rfl: 1   rosuvastatin (CRESTOR) 40 MG tablet, Take 40 mg by mouth daily., Disp: , Rfl:    senna (SENOKOT) 8.6 MG tablet, Take 1 tablet by mouth daily. Take 1-2 tablets (Patient not taking: Reported on 01/24/2022), Disp: , Rfl:    sertraline (ZOLOFT) 50 MG tablet, Take 1 tablet by mouth  daily., Disp: , Rfl:    tirzepatide (MOUNJARO) 2.5 MG/0.5ML Pen, Inject one pen once a week.  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, left ([facility])    Personal history of chemotherapy    Personal history of radiation therapy     Tobacco Use: Social History   Tobacco Use  Smoking Status Never  Smokeless Tobacco Never    Labs: Review Flowsheet        No data to display           Pulmonary Assessment Scores:  Pulmonary Assessment Scores     Row Name 02/26/22 1205         ADL UCSD   ADL Phase Entry     SOB Score total 106     Rest 1     Walk 4     Stairs 5     Bath 5     Dress 5     Shop 5       CAT Score   CAT Score 26       mMRC Score   mMRC Score 4              UCSD: Self-administered rating of dyspnea associated with activities of daily living (ADLs) 6-point scale (0 = "not at all" to 5 = "maximal or unable to do because of breathlessness")  Scoring Scores range from 0 to 120.  Minimally important difference is 5 units  CAT: CAT can identify the health impairment of COPD patients and is better correlated with disease progression.  CAT has a scoring range of zero to 40. The CAT score is classified into four groups of low (less than 10), medium (10 - 20), high (21-30) and very high (31-40) based on the impact level of disease on health status. A CAT score over 10 suggests significant symptoms.  A worsening CAT score could be explained by an exacerbation, poor medication adherence, poor inhaler technique, or progression of COPD or comorbid conditions.  CAT MCID is 2 points  mMRC: mMRC (Modified Medical Research Council) Dyspnea Scale is used to assess the degree of baseline functional disability in patients of respiratory disease due to dyspnea. No minimal  important difference is established. A decrease in score of 1 point or greater is considered a positive change.   Pulmonary Function Assessment:  Pulmonary Function Assessment - 02/26/22 1205  Breath   Shortness of Breath Yes;Panic with Shortness of Breath;Fear of Shortness of Breath;Limiting activity             Exercise Target Goals: Exercise Program Goal: Individual exercise prescription set using results from initial 6 min walk test and THRR while considering  patient's activity barriers and safety.   Exercise Prescription Goal: Initial exercise prescription builds to 30-45 minutes a day of aerobic activity, 2-3 days per week.  Home exercise guidelines will be given to patient during program as part of exercise prescription that the participant will acknowledge.  Education: Aerobic Exercise: - Group verbal and visual presentation on the components of exercise prescription. Introduces F.I.T.T principle from ACSM for exercise prescriptions.  Reviews F.I.T.T. principles of aerobic exercise including progression. Written material given at graduation.   Education: Resistance Exercise: - Group verbal and visual presentation on the components of exercise prescription. Introduces F.I.T.T principle from ACSM for exercise prescriptions  Reviews F.I.T.T. principles of resistance exercise including progression. Written material given at graduation. Flowsheet Row Pulmonary Rehab from 03/01/2022 in [facility] Cardiac and Pulmonary Rehab  Date 03/01/22  Educator NT  Instruction Review Code 1- Verbalizes Understanding        Education: Exercise & Equipment Safety: - Individual verbal instruction and demonstration of equipment use and safety with use of the equipment. Flowsheet Row Pulmonary Rehab from 03/01/2022 in [facility] Cardiac and Pulmonary Rehab  Date 02/26/22  Educator [facility]  Instruction Review Code 1- Verbalizes Understanding       Education: Exercise Physiology & General Exercise  Guidelines: - Group verbal and written instruction with models to review the exercise physiology of the cardiovascular system and associated critical values. Provides general exercise guidelines with specific guidelines to those with heart or lung disease.    Education: Flexibility, Balance, Mind/Body Relaxation: - Group verbal and visual presentation with interactive activity on the components of exercise prescription. Introduces F.I.T.T principle from ACSM for exercise prescriptions. Reviews F.I.T.T. principles of flexibility and balance exercise training including progression. Also discusses the mind body connection.  Reviews various relaxation techniques to help reduce and manage stress (i.e. Deep breathing, progressive muscle relaxation, and visualization). Balance handout provided to take home. Written material given at graduation. Flowsheet Row Pulmonary Rehab from 03/01/2022 in [facility] Cardiac and Pulmonary Rehab  Date 03/01/22  Educator NT  Instruction Review Code 1- Verbalizes Understanding       Activity Barriers & Risk Stratification:  Activity Barriers & Cardiac Risk Stratification - 02/26/22 0745       Activity Barriers & Cardiac Risk Stratification   Activity Barriers Arthritis;Muscular Weakness;Deconditioning;Balance Concerns;Shortness of Breath;History of Falls;Joint Problems;Other (comment);Neck/Spine Problems    Comments neuropathy in hands and feet, shots in both shoulders (hx shoulder fracture)             6 Minute Walk:  6 Minute Walk     Row Name 02/26/22 1157         6 Minute Walk   Phase Initial     Distance 500 feet     Walk Time 5.5 minutes     # of Rest Breaks 2  15 sec, 15 sec     MPH 1.03     METS 1.79     RPE 14     Perceived Dyspnea  2     VO2 Peak 6.26     Symptoms Yes (comment)     Comments SOB, fatigue, Chronic knees (8/10), hips (8/10), and feet (9/10) pain     Resting  HR 84 bpm     Resting BP 126/60     Resting Oxygen Saturation  94 %      Exercise Oxygen Saturation  during 6 min walk 95 %     Max Ex. HR 113 bpm     Max Ex. BP 124/70     2 Minute Post BP 124/68       Interval HR   1 Minute HR 105     2 Minute HR 106     3 Minute HR 109     4 Minute HR 109     5 Minute HR 105     6 Minute HR 113     2 Minute Post HR 108     Interval Heart Rate? Yes       Interval Oxygen   Interval Oxygen? Yes     Baseline Oxygen Saturation % 84 %  Room Air     1 Minute Oxygen Saturation % 95 %     1 Minute Liters of Oxygen 2 L  continuous     2 Minute Oxygen Saturation % 97 %     2 Minute Liters of Oxygen 2 L     3 Minute Oxygen Saturation % 96 %     3 Minute Liters of Oxygen 2 L     4 Minute Oxygen Saturation % 96 %     4 Minute Liters of Oxygen 2 L     5 Minute Oxygen Saturation % 95 %     5 Minute Liters of Oxygen 2 L     6 Minute Oxygen Saturation % 96 %     6 Minute Liters of Oxygen 2 L     2 Minute Post Oxygen Saturation % 96 %     2 Minute Post Liters of Oxygen 2 L             Oxygen Initial Assessment:  Oxygen Initial Assessment - 02/26/22 0753       Home Oxygen   Home Oxygen Device E-Tanks;Home Concentrator    Sleep Oxygen Prescription Continuous    Liters per minute 2    Home Exercise Oxygen Prescription Continuous    Liters per minute 2    Home Resting Oxygen Prescription None    Compliance with Home Oxygen Use Yes      Initial 6 min Walk   Oxygen Used Continuous;E-Tanks    Liters per minute 2      Program Oxygen Prescription   Program Oxygen Prescription Continuous;E-Tanks    Liters per minute 2      Intervention   Short Term Goals To learn and exhibit compliance with exercise, home and travel O2 prescription;To learn and understand importance of monitoring SPO2 with pulse oximeter and demonstrate accurate use of the pulse oximeter.;To learn and understand importance of maintaining oxygen saturations>88%;To learn and demonstrate proper pursed lip breathing techniques or other breathing  techniques. ;To learn and demonstrate proper use of respiratory medications    Long  Term Goals Exhibits compliance with exercise, home  and travel O2 prescription;Verbalizes importance of monitoring SPO2 with pulse oximeter and return demonstration;Compliance with respiratory medication;Maintenance of O2 saturations>88%;Exhibits proper breathing techniques, such as pursed lip breathing or other method taught during program session;Demonstrates proper use of MDI's             Oxygen Re-Evaluation:  Oxygen Re-Evaluation     Row Name 03/01/22 0932  Program Oxygen Prescription   Program Oxygen Prescription Continuous;E-Tanks       Liters per minute 2         Home Oxygen   Home Oxygen Device E-Tanks;Home Concentrator       Sleep Oxygen Prescription Continuous       Liters per minute 2       Home Exercise Oxygen Prescription Continuous       Liters per minute 2       Home Resting Oxygen Prescription None       Compliance with Home Oxygen Use Yes         Goals/Expected Outcomes   Short Term Goals To learn and demonstrate proper pursed lip breathing techniques or other breathing techniques.        Long  Term Goals Exhibits proper breathing techniques, such as pursed lip breathing or other method taught during program session       Comments Reviewed PLB technique with pt.  Talked about how it works and it's importance in maintaining their exercise saturations.       Goals/Expected Outcomes Short: Become more profiecient at using PLB.   Long: Become independent at using PLB.                Oxygen Discharge (Final Oxygen Re-Evaluation):  Oxygen Re-Evaluation - 03/01/22 0932       Program Oxygen Prescription   Program Oxygen Prescription Continuous;E-Tanks    Liters per minute 2      Home Oxygen   Home Oxygen Device E-Tanks;Home Concentrator    Sleep Oxygen Prescription Continuous    Liters per minute 2    Home Exercise Oxygen Prescription Continuous    Liters  per minute 2    Home Resting Oxygen Prescription None    Compliance with Home Oxygen Use Yes      Goals/Expected Outcomes   Short Term Goals To learn and demonstrate proper pursed lip breathing techniques or other breathing techniques.     Long  Term Goals Exhibits proper breathing techniques, such as pursed lip breathing or other method taught during program session    Comments Reviewed PLB technique with pt.  Talked about how it works and it's importance in maintaining their exercise saturations.    Goals/Expected Outcomes Short: Become more profiecient at using PLB.   Long: Become independent at using PLB.             Initial Exercise Prescription:  Initial Exercise Prescription - 02/26/22 1100       Date of Initial Exercise RX and Referring Provider   Date 02/26/22    Referring Provider Margaretha Seeds MD      Oxygen   Oxygen Continuous    Liters 2    Maintain Oxygen Saturation 88% or higher      Treadmill   MPH 0.8    Grade 0    Minutes 15    METs 1.6      Recumbant Bike   Level 1    RPM 50    Watts 10    Minutes 15    METs 1.5      NuStep   Level 1    SPM 60    Minutes 15    METs 1.5      T5 Nustep   Level 1    SPM 60    Minutes 15    METs 1.5      Biostep-RELP   Level 1    SPM 40  Minutes 15    METs 2      Track   Laps 14    Minutes 15    METs 1.76      Prescription Details   Frequency (times per week) 2    Duration Progress to 30 minutes of continuous aerobic without signs/symptoms of physical distress      Intensity   THRR 40-80% of Max Heartrate 114-145    Ratings of Perceived Exertion 11-13    Perceived Dyspnea 0-4      Progression   Progression Continue to progress workloads to maintain intensity without signs/symptoms of physical distress.      Resistance Training   Training Prescription Yes    Weight 2 lb    Reps 10-15             Perform Capillary Blood Glucose checks as needed.  Exercise Prescription  Changes:   Exercise Prescription Changes     Row Name 02/26/22 1100             Response to Exercise   Blood Pressure (Admit) 126/60       Blood Pressure (Exercise) 124/70       Blood Pressure (Exit) 128/66       Heart Rate (Admit) 84 bpm       Heart Rate (Exercise) 113 bpm       Heart Rate (Exit) 91 bpm       Oxygen Saturation (Admit) 94 %       Oxygen Saturation (Exercise) 95 %       Oxygen Saturation (Exit) 96 %       Rating of Perceived Exertion (Exercise) 14       Perceived Dyspnea (Exercise) 2       Symptoms SOB, fatigue, chronic pain hips, knees, feet (8-9/10)       Comments walk test results                Exercise Comments:   Exercise Goals and Review:   Exercise Goals     Row Name 02/26/22 1202             Exercise Goals   Increase Physical Activity Yes       Intervention Provide advice, education, support and counseling about physical activity/exercise needs.;Develop an individualized exercise prescription for aerobic and resistive training based on initial evaluation findings, risk stratification, comorbidities and participant's personal goals.       Expected Outcomes Short Term: Attend rehab on a regular basis to increase amount of physical activity.;Long Term: Add in home exercise to make exercise part of routine and to increase amount of physical activity.;Long Term: Exercising regularly at least 3-5 days a week.       Increase Strength and Stamina Yes       Intervention Provide advice, education, support and counseling about physical activity/exercise needs.;Develop an individualized exercise prescription for aerobic and resistive training based on initial evaluation findings, risk stratification, comorbidities and participant's personal goals.       Expected Outcomes Short Term: Increase workloads from initial exercise prescription for resistance, speed, and METs.;Short Term: Perform resistance training exercises routinely during rehab and add in  resistance training at home;Long Term: Improve cardiorespiratory fitness, muscular endurance and strength as measured by increased METs and functional capacity (6MWT)       Able to understand and use rate of perceived exertion (RPE) scale Yes       Intervention Provide education and explanation on how to use RPE scale  Expected Outcomes Short Term: Able to use RPE daily in rehab to express subjective intensity level;Long Term:  Able to use RPE to guide intensity level when exercising independently       Able to understand and use Dyspnea scale Yes       Intervention Provide education and explanation on how to use Dyspnea scale       Expected Outcomes Short Term: Able to use Dyspnea scale daily in rehab to express subjective sense of shortness of breath during exertion;Long Term: Able to use Dyspnea scale to guide intensity level when exercising independently       Knowledge and understanding of Target Heart Rate Range (THRR) Yes       Intervention Provide education and explanation of THRR including how the numbers were predicted and where they are located for reference       Expected Outcomes Short Term: Able to state/look up THRR;Short Term: Able to use daily as guideline for intensity in rehab;Long Term: Able to use THRR to govern intensity when exercising independently       Able to check pulse independently Yes       Intervention Provide education and demonstration on how to check pulse in carotid and radial arteries.;Review the importance of being able to check your own pulse for safety during independent exercise       Expected Outcomes Long Term: Able to check pulse independently and accurately;Short Term: Able to explain why pulse checking is important during independent exercise       Understanding of Exercise Prescription Yes       Intervention Provide education, explanation, and written materials on patient's individual exercise prescription       Expected Outcomes Short Term: Able to  explain program exercise prescription;Long Term: Able to explain home exercise prescription to exercise independently                Exercise Goals Re-Evaluation :  Exercise Goals Re-Evaluation     Row Name 03/01/22 0934             Exercise Goal Re-Evaluation   Exercise Goals Review Able to understand and use rate of perceived exertion (RPE) scale;Knowledge and understanding of Target Heart Rate Range (THRR);Able to understand and use Dyspnea scale;Understanding of Exercise Prescription       Comments Reviewed RPE and dyspnea scales, THR and program prescription with pt today.  Pt voiced understanding and was given a copy of goals to take home.       Expected Outcomes Short: Use RPE daily to regulate intensity. Long: Follow program prescription in THR.                Discharge Exercise Prescription (Final Exercise Prescription Changes):  Exercise Prescription Changes - 02/26/22 1100       Response to Exercise   Blood Pressure (Admit) 126/60    Blood Pressure (Exercise) 124/70    Blood Pressure (Exit) 128/66    Heart Rate (Admit) 84 bpm    Heart Rate (Exercise) 113 bpm    Heart Rate (Exit) 91 bpm    Oxygen Saturation (Admit) 94 %    Oxygen Saturation (Exercise) 95 %    Oxygen Saturation (Exit) 96 %    Rating of Perceived Exertion (Exercise) 14    Perceived Dyspnea (Exercise) 2    Symptoms SOB, fatigue, chronic pain hips, knees, feet (8-9/10)    Comments walk test results             Nutrition:  Target Goals: Understanding of nutrition guidelines, daily intake of sodium <1551m, cholesterol <2027m calories 30% from fat and 7% or less from saturated fats, daily to have 5 or more servings of fruits and vegetables.  Education: All About Nutrition: -Group instruction provided by verbal, written material, interactive activities, discussions, models, and posters to present general guidelines for heart healthy nutrition including fat, fiber, MyPlate, the role of  sodium in heart healthy nutrition, utilization of the nutrition label, and utilization of this knowledge for meal planning. Follow up email sent as well. Written material given at graduation.   Biometrics:  Pre Biometrics - 02/26/22 1202       Pre Biometrics   Height 5' 3.5" (1.613 m)    Weight 186 lb 8 oz (84.6 kg)    Waist Circumference 34.5 inches    Hip Circumference 35.5 inches    Waist to Hip Ratio 0.97 %    BMI (Calculated) 32.51    Single Leg Stand 0 seconds              Nutrition Therapy Plan and Nutrition Goals:  Nutrition Therapy & Goals - 02/26/22 0748       Intervention Plan   Intervention Prescribe, educate and counsel regarding individualized specific dietary modifications aiming towards targeted core components such as weight, hypertension, lipid management, diabetes, heart failure and other comorbidities.    Expected Outcomes Short Term Goal: Understand basic principles of dietary content, such as calories, fat, sodium, cholesterol and nutrients.;Short Term Goal: A plan has been developed with personal nutrition goals set during dietitian appointment.;Long Term Goal: Adherence to prescribed nutrition plan.             Nutrition Assessments:  MEDIFICTS Score Key: ?70 Need to make dietary changes  40-70 Heart Healthy Diet ? 40 Therapeutic Level Cholesterol Diet  [facility]  Picture Your Plate Total Score on Admission 75      Picture Your Plate Scores: <4<40nhealthy dietary pattern with much room for improvement. 41-50 Dietary pattern unlikely to meet recommendations for good health and room for improvement. 51-60 More healthful dietary pattern, with some room for improvement.  >60 Healthy dietary pattern, although there may be some specific behaviors that could be improved.   Nutrition Goals Re-Evaluation:   Nutrition Goals Discharge (Final Nutrition Goals  Re-Evaluation):   Psychosocial: Target Goals: Acknowledge presence or absence of significant depression and/or stress, maximize coping skills, provide positive support system. Participant is able to verbalize types and ability to use techniques and skills needed for reducing stress and depression.   Education: Stress, Anxiety, and Depression - Group verbal and visual presentation to define topics covered.  Reviews how body is impacted by stress, anxiety, and depression.  Also discusses healthy ways to reduce stress and to treat/manage anxiety and depression.  Written material given at graduation.   Education: Sleep Hygiene -Provides group verbal and written instruction about how sleep can affect your health.  Define sleep hygiene, discuss sleep cycles and impact of sleep habits. Review good sleep hygiene tips.    Initial Review & Psychosocial Screening:  Initial Psych Review & Screening - 02/26/22 0748       Initial Review   Current issues with Current Depression;Current Psychotropic Meds;History of Depression;Current Stress Concerns    Source of Stress Concerns Chronic Illness;Unable to participate in former interests or hobbies;Unable to perform yard/household activities    Comments currently on sertaline, good days and bad days  mostly well managed, getting eyes fixed to be able to see again      [place]? Yes   Husband, kids (lives near by)   Comments 4 kids one calls daily , other three drop in      Barriers   Psychosocial barriers to participate in program The patient should benefit from training in stress management and relaxation.;Psychosocial barriers identified (see note)      Screening Interventions   Interventions Encouraged to exercise;To provide support and resources with identified psychosocial needs;Provide feedback about the scores to participant    Expected Outcomes Short Term goal: Utilizing psychosocial counselor, staff and physician to  assist with identification of specific Stressors or current issues interfering with healing process. Setting desired goal for each stressor or current issue identified.;Long Term Goal: Stressors or current issues are controlled or eliminated.;Short Term goal: Identification and review with participant of any Quality of Life or Depression concerns found by scoring the questionnaire.;Long Term goal: The participant improves quality of Life and PHQ9 Scores as seen by post scores and/or verbalization of changes             Quality of Life Scores:  Scores of 19 and below usually indicate a poorer quality of life in these areas.  A difference of  2-3 points is a clinically meaningful difference.  A difference of 2-3 points in the total score of the Quality of Life Index has been associated with significant improvement in overall quality of life, self-image, physical symptoms, and general health in studies assessing change in quality of life.  PHQ-9: Review Flowsheet       02/26/2022  Depression screen PHQ 2/9  Decreased Interest 2  Down, Depressed, Hopeless 1  PHQ - 2 Score 3  Altered sleeping 3  Tired, decreased energy 3  Change in appetite 2  Feeling bad or failure about yourself  1  Trouble concentrating 3  Moving slowly or fidgety/restless 2  Suicidal thoughts 0  PHQ-9 Score 17  Difficult doing work/chores Somewhat difficult   Interpretation of Total Score  Total Score Depression Severity:  1-4 = Minimal depression, 5-9 = Mild depression, 10-14 = Moderate depression, 15-19 = Moderately severe depression, 20-27 = Severe depression   Psychosocial Evaluation and Intervention:   Psychosocial Re-Evaluation:   Psychosocial Discharge (Final Psychosocial Re-Evaluation):   Education: Education Goals: Education classes will be provided on a weekly basis, covering required topics. Participant will state understanding/return demonstration of topics presented.  Learning  Barriers/Preferences:  Learning Barriers/Preferences - 02/26/22 0747       Learning Barriers/Preferences   Learning Barriers Sight   eye lids drooping   Learning Preferences Skilled Demonstration;Verbal Instruction             General Pulmonary Education Topics:  Infection Prevention: - Provides verbal and written material to individual with discussion of infection control including proper hand washing and proper equipment cleaning during exercise session. Flowsheet Row Pulmonary Rehab from 03/01/2022 in [facility] Cardiac and Pulmonary Rehab  Date 02/26/22  Educator [facility]  Instruction Review Code 1- Verbalizes Understanding       Falls Prevention: - Provides verbal and written material to individual with discussion of falls prevention and safety. Flowsheet Row Pulmonary Rehab from 03/01/2022 in [facility] Cardiac and Pulmonary Rehab  Date 02/26/22  Educator [facility]  Instruction Review Code 1- Verbalizes Understanding       Chronic Lung Disease Review: - Group verbal instruction with posters, models, PowerPoint presentations and videos,  to review new updates, new respiratory medications, new advancements in procedures and treatments. Providing information on websites and "800" numbers for continued self-education. Includes information about supplement oxygen, available portable oxygen systems, continuous and intermittent flow rates, oxygen safety, concentrators, and Medicare reimbursement for oxygen. Explanation of Pulmonary Drugs, including class, frequency, complications, importance of spacers, rinsing mouth after steroid MDI's, and proper cleaning methods for nebulizers. Review of basic lung anatomy and physiology related to function, structure, and complications of lung disease. Review of risk factors. Discussion about methods for diagnosing sleep apnea and types of masks and machines for OSA. Includes a review of the use of types of environmental controls: home humidity, furnaces, filters, dust  mite/pet prevention, HEPA vacuums. Discussion about weather changes, air quality and the benefits of nasal washing. Instruction on Warning signs, infection symptoms, calling MD promptly, preventive modes, and value of vaccinations. Review of effective airway clearance, coughing and/or vibration techniques. Emphasizing that all should Create an Action Plan. Written material given at graduation. Flowsheet Row Pulmonary Rehab from 03/01/2022 in [facility] Cardiac and Pulmonary Rehab  Education need identified 02/26/22       AED/CPR: - Group verbal and written instruction with the use of models to demonstrate the basic use of the AED with the basic ABC's of resuscitation.    Anatomy and Cardiac Procedures: - Group verbal and visual presentation and models provide information about basic cardiac anatomy and function. Reviews the testing methods done to diagnose heart disease and the outcomes of the test results. Describes the treatment choices: Medical Management, Angioplasty, or Coronary Bypass Surgery for treating various heart conditions including Myocardial Infarction, Angina, Valve Disease, and Cardiac Arrhythmias.  Written material given at graduation.   Medication Safety: - Group verbal and visual instruction to review commonly prescribed medications for heart and lung disease. Reviews the medication, class of the drug, and side effects. Includes the steps to properly store meds and maintain the prescription regimen.  Written material given at graduation.   Other: -Provides group and verbal instruction on various topics (see comments)   Knowledge Questionnaire Score:  Knowledge Questionnaire Score - 02/26/22 1204       Knowledge Questionnaire Score   Pre Score 12/18              Core Components/Risk Factors/Patient Goals at Admission:  Personal Goals and Risk Factors at Admission - 02/26/22 0751       Core Components/Risk Factors/Patient Goals on Admission    Weight Management  Yes;Weight Loss;Obesity    Intervention Weight Management: Develop a combined nutrition and exercise program designed to reach desired caloric intake, while maintaining appropriate intake of nutrient and fiber, sodium and fats, and appropriate energy expenditure required for the weight goal.;Weight Management: Provide education and appropriate resources to help participant work on and attain dietary goals.;Weight Management/Obesity: Establish reasonable short term and long term weight goals.;Obesity: Provide education and appropriate resources to help participant work on and attain dietary goals.    Admit Weight 186 lb 8 oz (84.6 kg)    Goal Weight: Short Term 182 lb (82.6 kg)    Goal Weight: Long Term 180 lb (81.6 kg)    Expected Outcomes Short Term: Continue to assess and modify interventions until short term weight is achieved;Long Term: Adherence to nutrition and physical activity/exercise program aimed toward attainment of established weight goal;Weight Loss: Understanding of general recommendations for a balanced deficit meal plan, which promotes 1-2 lb weight loss per week and includes a negative energy balance of [phone]  kcal/d;Understanding recommendations for meals to include 15-35% energy as protein, 25-35% energy from fat, 35-60% energy from carbohydrates, less than 232m of dietary cholesterol, 20-35 gm of total fiber daily;Understanding of distribution of calorie intake throughout the day with the consumption of 4-5 meals/snacks    Improve shortness of breath with ADL's Yes    Intervention Provide education, individualized exercise plan and daily activity instruction to help decrease symptoms of SOB with activities of daily living.    Expected Outcomes Short Term: Improve cardiorespiratory fitness to achieve a reduction of symptoms when performing ADLs;Long Term: Be able to perform more ADLs without symptoms or delay the onset of symptoms    Increase knowledge of respiratory medications and  ability to use respiratory devices properly  Yes    Intervention Provide education and demonstration as needed of appropriate use of medications, inhalers, and oxygen therapy.    Expected Outcomes Short Term: Achieves understanding of medications use. Understands that oxygen is a medication prescribed by physician. Demonstrates appropriate use of inhaler and oxygen therapy.;Long Term: Maintain appropriate use of medications, inhalers, and oxygen therapy.    Diabetes Yes    Intervention Provide education about signs/symptoms and action to take for hypo/hyperglycemia.;Provide education about proper nutrition, including hydration, and aerobic/resistive exercise prescription along with prescribed medications to achieve blood glucose in normal ranges: Fasting glucose 65-99 mg/dL    Expected Outcomes Short Term: Participant verbalizes understanding of the signs/symptoms and immediate care of hyper/hypoglycemia, proper foot care and importance of medication, aerobic/resistive exercise and nutrition plan for blood glucose control.;Long Term: Attainment of HbA1C < 7%.    Hypertension Yes    Intervention Provide education on lifestyle modifcations including regular physical activity/exercise, weight management, moderate sodium restriction and increased consumption of fresh fruit, vegetables, and low fat dairy, alcohol moderation, and smoking cessation.;Monitor prescription use compliance.    Expected Outcomes Short Term: Continued assessment and intervention until BP is < 140/99mHG in hypertensive participants. < 130/8039mG in hypertensive participants with diabetes, heart failure or chronic kidney disease.;Long Term: Maintenance of blood pressure at goal levels.    Lipids Yes    Intervention Provide education and support for participant on nutrition & aerobic/resistive exercise along with prescribed medications to achieve LDL <42m71mDL >40mg26m Expected Outcomes Short Term: Participant states understanding of  desired cholesterol values and is compliant with medications prescribed. Participant is following exercise prescription and nutrition guidelines.;Long Term: Cholesterol controlled with medications as prescribed, with individualized exercise RX and with personalized nutrition plan. Value goals: LDL < 42mg,79m > 40 mg.             Education:Diabetes - Individual verbal and written instruction to review signs/symptoms of diabetes, desired ranges of glucose level fasting, after meals and with exercise. Acknowledge that pre and post exercise glucose checks will be done for 3 sessions at entry of program. Flowsheet Row [facility]  Date 02/26/22  [facility] 1- Verbalizes Understanding       Know Your Numbers and Heart Failure: - Group verbal and visual instruction to discuss disease risk factors for cardiac and pulmonary disease and treatment options.  Reviews associated critical values for Overweight/Obesity, Hypertension, Cholesterol, and Diabetes.  Discusses basics of heart failure: signs/symptoms and treatments.  Introduces Heart Failure Zone chart for action plan for heart failure.  Written material given at graduation.   Core Components/Risk Factors/Patient Goals Review:    Core Components/Risk Factors/Patient  Goals at Discharge (Final Review):    ITP Comments:  ITP Comments     Row Name 02/26/22 1155 03/21/22 0723         ITP Comments Completed 6MWT and gym orientation. Initial ITP created and sent for review to Dr. Zetta Bills, Medical Director.  Documentation for diagnosis can be found in [facility] encounter 01/12/22. 30 Day review completed. Medical Director ITP review done, changes made as directed, and signed approval by Medical Director.    New to program               Comments:

## 2022-03-22 ENCOUNTER — Encounter: Payer: Medicare Other | Attending: Pulmonary Disease | Admitting: *Deleted

## 2022-03-22 DIAGNOSIS — R5383 Other fatigue: Secondary | ICD-10-CM | POA: Diagnosis not present

## 2022-03-22 DIAGNOSIS — J449 Chronic obstructive pulmonary disease, unspecified: Secondary | ICD-10-CM | POA: Diagnosis not present

## 2022-03-22 DIAGNOSIS — R0602 Shortness of breath: Secondary | ICD-10-CM | POA: Insufficient documentation

## 2022-03-22 NOTE — Progress Notes (Signed)
Daily Session Note  Patient Details  Name: Ruth White MRN: [id number] Date of Birth: 08/14/1960 Referring Provider:   Flowsheet Row Pulmonary Rehab from 02/26/2022 in [facility] Cardiac and Pulmonary Rehab  Referring Provider Margaretha Seeds MD       Encounter Date: 03/22/2022  Check In:  Session Check In - 03/22/22 0825       Check-In   Supervising physician immediately available to respond to emergencies See telemetry face sheet for immediately available ER MD    Location [facility]-Cardiac & Pulmonary Rehab    Staff Present Darlyne Russian, RN, ADN;Joseph Tessie Fass, RCP,RRT,BSRT;Melissa [place], [place], LDN    Virtual Visit No    Medication changes reported     No    Fall or balance concerns reported    No    Warm-up and Cool-down Performed on first and last piece of equipment    Resistance Training Performed Yes    VAD Patient? No    PAD/SET Patient? No      Pain Assessment   Currently in Pain? No/denies                Social History   Tobacco Use  Smoking Status Never  Smokeless Tobacco Never    Goals Met:  Independence with exercise equipment Exercise tolerated well No report of concerns or symptoms today Strength training completed today  Goals Unmet:  Not Applicable  Comments: Pt able to follow exercise prescription today without complaint.  Will continue to monitor for progression.    Dr. Emily Filbert is Medical Director for [place].  Dr. Ottie Glazier is Medical Director for [facility] Pulmonary Rehabilitation.

## 2022-03-26 ENCOUNTER — Ambulatory Visit: Payer: Medicare Other

## 2022-03-26 NOTE — Progress Notes (Signed)
Completed initial RD consultation ?

## 2022-03-27 ENCOUNTER — Encounter: Payer: Medicare Other | Admitting: *Deleted

## 2022-03-27 DIAGNOSIS — R5383 Other fatigue: Secondary | ICD-10-CM | POA: Diagnosis not present

## 2022-03-27 DIAGNOSIS — J449 Chronic obstructive pulmonary disease, unspecified: Secondary | ICD-10-CM

## 2022-03-27 DIAGNOSIS — R0602 Shortness of breath: Secondary | ICD-10-CM | POA: Diagnosis not present

## 2022-03-27 LAB — GLUCOSE, CAPILLARY
Glucose-Capillary: 120 mg/dL — ABNORMAL HIGH (ref 70–99)
Glucose-Capillary: 121 mg/dL — ABNORMAL HIGH (ref 70–99)

## 2022-03-27 NOTE — Progress Notes (Signed)
Daily Session Note  Patient Details  Name: Ruth White MRN: [id number] Date of Birth: 1960-09-15 Referring Provider:   Flowsheet Row Pulmonary Rehab from 02/26/2022 in [facility] Cardiac and Pulmonary Rehab  Referring Provider Margaretha Seeds MD       Encounter Date: 03/27/2022  Check In:  Session Check In - 03/27/22 0941       Check-In   Supervising physician immediately available to respond to emergencies See telemetry face sheet for immediately available ER MD    Location [facility]-Cardiac & Pulmonary Rehab    Staff Present Heath Lark, RN, BSN, CCRP;Jessica [place], MA, RCEP, CCRP, [place], [place], ASCM CEP, Exercise Physiologist    Virtual Visit No    Medication changes reported     No    Fall or balance concerns reported    No    Tobacco Cessation No Change    Warm-up and Cool-down Performed on first and last piece of equipment    Resistance Training Performed Yes    VAD Patient? No    PAD/SET Patient? No      Pain Assessment   Currently in Pain? No/denies                Social History   Tobacco Use  Smoking Status Never  Smokeless Tobacco Never    Goals Met:  Proper associated with RPD/PD & O2 Sat Independence with exercise equipment Exercise tolerated well No report of concerns or symptoms today  Goals Unmet:  Not Applicable  Comments: Pt able to follow exercise prescription today without complaint.  Will continue to monitor for progression.    Dr. Emily Filbert is Medical Director for [place].  Dr. Ottie Glazier is Medical Director for [facility] Pulmonary Rehabilitation.

## 2022-03-27 NOTE — Progress Notes (Signed)
Ruth White is currently enrolled in Pulmonary Rehab.  We feel that she would better benefit from going to Physical Therapy first prior to finishing rehab.  She would do better in rehab if she could build up her strength and balance in physical therapy.  Note sent to MD to request PT referral. We will continue with rehab until set up.  Spoke with Aimar and her husband and they both agreed and voiced understanding. They would prefer to do PT here at [facility].   Alberteen Sam, MA, [place], CCRP 03/27/2022 8:22 AM   6 Minute Walk     Row Name 02/26/22 1157         6 Minute Walk   Phase Initial     Distance 500 feet     Walk Time 5.5 minutes     # of Rest Breaks 2  15 sec, 15 sec     MPH 1.03     METS 1.79     RPE 14     Perceived Dyspnea  2     VO2 Peak 6.26     Symptoms Yes (comment)     Comments SOB, fatigue, Chronic knees (8/10), hips (8/10), and feet (9/10) pain     Resting HR 84 bpm     Resting BP 126/60     Resting Oxygen Saturation  94 %     Exercise Oxygen Saturation  during 6 min walk 95 %     Max Ex. HR 113 bpm     Max Ex. BP 124/70     2 Minute Post BP 124/68       Interval HR   1 Minute HR 105     2 Minute HR 106     3 Minute HR 109     4 Minute HR 109     5 Minute HR 105     6 Minute HR 113     2 Minute Post HR 108     Interval Heart Rate? Yes       Interval Oxygen   Interval Oxygen? Yes     Baseline Oxygen Saturation % 84 %  Room Air     1 Minute Oxygen Saturation % 95 %     1 Minute Liters of Oxygen 2 L  continuous     2 Minute Oxygen Saturation % 97 %     2 Minute Liters of Oxygen 2 L     3 Minute Oxygen Saturation % 96 %     3 Minute Liters of Oxygen 2 L     4 Minute Oxygen Saturation % 96 %     4 Minute Liters of Oxygen 2 L     5 Minute Oxygen Saturation % 95 %     5 Minute Liters of Oxygen 2 L     6 Minute Oxygen Saturation % 96 %     6 Minute Liters of Oxygen 2 L     2 Minute Post Oxygen Saturation % 96 %     2 Minute Post Liters of Oxygen 2 L

## 2022-03-29 ENCOUNTER — Encounter: Payer: Medicare Other | Admitting: *Deleted

## 2022-03-29 DIAGNOSIS — R5383 Other fatigue: Secondary | ICD-10-CM | POA: Diagnosis not present

## 2022-03-29 DIAGNOSIS — J449 Chronic obstructive pulmonary disease, unspecified: Secondary | ICD-10-CM

## 2022-03-29 DIAGNOSIS — R0602 Shortness of breath: Secondary | ICD-10-CM | POA: Diagnosis not present

## 2022-03-29 NOTE — Progress Notes (Signed)
Daily Session Note  Patient Details  Name: Ruth White MRN: [id number] Date of Birth: December 23, 1960 Referring Provider:   Flowsheet Row Pulmonary Rehab from 02/26/2022 in [facility] Cardiac and Pulmonary Rehab  Referring Provider Margaretha Seeds MD       Encounter Date: 03/29/2022  Check In:  Session Check In - 03/29/22 0828       Check-In   Supervising physician immediately available to respond to emergencies See telemetry face sheet for immediately available ER MD    Location [facility]-Cardiac & Pulmonary Rehab    Staff Present Alberteen Sam, MA, RCEP, CCRP, CCET;Joseph [place], [place], RN, [place]    Virtual Visit No    Medication changes reported     No    Fall or balance concerns reported    No    Warm-up and Cool-down Performed on first and last piece of equipment    Resistance Training Performed Yes    VAD Patient? No    PAD/SET Patient? No      Pain Assessment   Currently in Pain? No/denies                Social History   Tobacco Use  Smoking Status Never  Smokeless Tobacco Never    Goals Met:  Independence with exercise equipment Exercise tolerated well No report of concerns or symptoms today Strength training completed today  Goals Unmet:  Not Applicable  Comments: Pt able to follow exercise prescription today without complaint.  Will continue to monitor for progression.    Dr. Emily Filbert is Medical Director for [place].  Dr. Ottie Glazier is Medical Director for [facility] Pulmonary Rehabilitation.

## 2022-03-29 NOTE — Progress Notes (Signed)
[facility]  [address] [place],  [place]  [address] [phone]  Clinic Day:  03/30/2022  Referring physician: Margaretha Seeds, MD  HISTORY OF PRESENT ILLNESS:  The patient is a 61 y.o. 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  She was placed on weekly paclitaxel/Herceptin for her adjuvant therapy.  However, due to significant peripheral neuropathy, her paclitaxel was discontinued a few weeks before the entire 12 weekly treatments were completed.  She completed 1 year of HER2 therapy in May 2022.  She also completed adjuvant breast radiation.  She takes anastrozole on a daily basis for her adjuvant endocrine therapy.  She comes in today for routine followup.  Since her last visit, the patient has been doing okay.  Her neuropathy remains prominent in her hands and feet.  She is currently being seen by pain management where she takes Lyrica, Cymbalta, and oxycodone as needed.  She has also been seen by neurology, who recommended that her neuropathy continue to be managed supportively.  Overall, the patient claims to be doing much better.  Despite her issues with neuropathy and COPD, her daily quality of life remains decent.  She denies having any particular changes with her breasts which concern her for disease recurrence.  PHYSICAL EXAM:  Blood pressure (!) 140/84, pulse 78, temperature 98.9 F (37.2 C), resp. rate 16, height _0  (1.626 m), weight 179 lb 12.8 oz (81.6 kg), last menstrual period 08/12/2020, SpO2 99 %. Wt Readings from Last 3 Encounters:  03/30/22 179 lb 12.8 oz (81.6 kg)  03/20/22 182 lb (82.6 kg)  02/26/22 186 lb 8 oz (84.6 kg)   Body mass index is 30.86 kg/m. Performance status (ECOG): 1 - Symptomatic but completely ambulatory Physical Exam Constitutional:      Appearance: Normal appearance.     Comments: She is now wearing oxygen per  nasal canula  HENT:     Mouth/Throat:     Pharynx: Oropharynx is clear. No oropharyngeal exudate.  Cardiovascular:     Rate and Rhythm: Normal rate and regular rhythm.     Heart sounds: No murmur heard.    No friction rub. No gallop.  Pulmonary:     Breath sounds: Normal breath sounds.  Chest:  Breasts:    Right: No swelling, bleeding, inverted nipple, mass, nipple discharge or skin change.     Left: No swelling, bleeding, inverted nipple, mass, nipple discharge or skin change.  Abdominal:     General: Bowel sounds are normal. There is no distension.     Palpations: Abdomen is soft. There is no mass.     Tenderness: There is no abdominal tenderness.  Musculoskeletal:        General: No tenderness.     Cervical back: Normal range of motion and neck supple.     Right lower leg: No edema.     Left lower leg: No edema.  Lymphadenopathy:     Cervical: No cervical adenopathy.     Right cervical: No superficial, deep or posterior cervical adenopathy.    Left cervical: No superficial, deep or posterior cervical adenopathy.     Upper Body:     Right upper body: No supraclavicular or axillary adenopathy.     Left upper body: No supraclavicular or axillary adenopathy.     Lower Body: No right inguinal adenopathy. No left inguinal adenopathy.  Skin:    Coloration:  Skin is not jaundiced.     Findings: No lesion or rash.  Neurological:     General: No focal deficit present.     Mental Status: She is alert and oriented to person, place, and time. Mental status is at baseline.  Psychiatric:        Mood and Affect: Mood is depressed.        Behavior: Behavior normal.        Thought Content: Thought content normal.        Judgment: Judgment normal.    ASSESSMENT & PLAN:  A 61 y.o. 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  Based upon her clinical breast exam today, the patient remains disease free.  She knows to continue taking her  anastrozole daily for 5 total years of adjuvant endocrine therapy.  With respect to her painful neuropathy, she knows to stay on the regimen that has been formulated per her pain management clinic.  I will see her back in 6 months for her next clinical breast exam.  Before that visit, the patient will undergo her annual mammogram for continued radiographic breast cancer surveillance.  She will also undergo a bone density study to ensure there has been no bone loss while on her adjuvant endocrine therapy.  The patient understands all the plans discussed today and is in agreement with them.    Herbie Lehrmann Macarthur Critchley, MD

## 2022-03-30 ENCOUNTER — Other Ambulatory Visit: Payer: Self-pay | Admitting: Oncology

## 2022-03-30 ENCOUNTER — Inpatient Hospital Stay: Payer: Medicare Other | Attending: Oncology | Admitting: Oncology

## 2022-03-30 VITALS — BP 140/84 | HR 78 | Temp 98.9°F | Resp 16 | Ht 64.0 in | Wt 179.8 lb

## 2022-03-30 DIAGNOSIS — C50012 Malignant neoplasm of nipple and areola, left female breast: Secondary | ICD-10-CM

## 2022-04-01 DIAGNOSIS — J9611 Chronic respiratory failure with hypoxia: Secondary | ICD-10-CM | POA: Diagnosis not present

## 2022-04-01 DIAGNOSIS — J449 Chronic obstructive pulmonary disease, unspecified: Secondary | ICD-10-CM | POA: Diagnosis not present

## 2022-04-01 DIAGNOSIS — R0602 Shortness of breath: Secondary | ICD-10-CM | POA: Diagnosis not present

## 2022-04-03 ENCOUNTER — Encounter: Payer: Medicare Other | Admitting: *Deleted

## 2022-04-03 DIAGNOSIS — J449 Chronic obstructive pulmonary disease, unspecified: Secondary | ICD-10-CM

## 2022-04-03 DIAGNOSIS — R5383 Other fatigue: Secondary | ICD-10-CM | POA: Diagnosis not present

## 2022-04-03 DIAGNOSIS — R0602 Shortness of breath: Secondary | ICD-10-CM | POA: Diagnosis not present

## 2022-04-03 NOTE — Progress Notes (Signed)
Daily Session Note  Patient Details  Name: Ruth White MRN: [id number] Date of Birth: October 15, 1960 Referring Provider:   Flowsheet Row Pulmonary Rehab from 02/26/2022 in [facility] Cardiac and Pulmonary Rehab  Referring Provider Margaretha Seeds MD       Encounter Date: 04/03/2022  Check In:  Session Check In - 04/03/22 0842       Check-In   Supervising physician immediately available to respond to emergencies See telemetry face sheet for immediately available ER MD    Location [facility]-Cardiac & Pulmonary Rehab    Staff Present Heath Lark, RN, BSN, CCRP;Jessica [place], MA, RCEP, CCRP, Marylynn Pearson, MS, ASCM CEP, Exercise Physiologist    Virtual Visit No    Medication changes reported     No    Fall or balance concerns reported    No    Warm-up and Cool-down Performed on first and last piece of equipment    Resistance Training Performed Yes    VAD Patient? No    PAD/SET Patient? No      Pain Assessment   Currently in Pain? No/denies                Social History   Tobacco Use  Smoking Status Never  Smokeless Tobacco Never    Goals Met:  Proper associated with RPD/PD & O2 Sat Independence with exercise equipment Exercise tolerated well No report of concerns or symptoms today  Goals Unmet:  Not Applicable  Comments: Pt able to follow exercise prescription today without complaint.  Will continue to monitor for progression.    Dr. Emily Filbert is Medical Director for [place].  Dr. Ottie Glazier is Medical Director for [facility] Pulmonary Rehabilitation.

## 2022-04-04 DIAGNOSIS — Z79899 Other long term (current) drug therapy: Secondary | ICD-10-CM | POA: Diagnosis not present

## 2022-04-04 DIAGNOSIS — G894 Chronic pain syndrome: Secondary | ICD-10-CM | POA: Diagnosis not present

## 2022-04-05 ENCOUNTER — Encounter: Payer: Medicare Other | Admitting: *Deleted

## 2022-04-05 DIAGNOSIS — R0602 Shortness of breath: Secondary | ICD-10-CM | POA: Diagnosis not present

## 2022-04-05 DIAGNOSIS — J449 Chronic obstructive pulmonary disease, unspecified: Secondary | ICD-10-CM | POA: Diagnosis not present

## 2022-04-05 DIAGNOSIS — R5383 Other fatigue: Secondary | ICD-10-CM | POA: Diagnosis not present

## 2022-04-05 NOTE — Progress Notes (Signed)
Daily Session Note  Patient Details  Name: Ruth White MRN: [id number] Date of Birth: 1960-07-17 Referring Provider:   Flowsheet Row Pulmonary Rehab from 02/26/2022 in [facility] Cardiac and Pulmonary Rehab  Referring Provider Margaretha Seeds MD       Encounter Date: 04/05/2022  Check In:  Session Check In - 04/05/22 0844       Check-In   Supervising physician immediately available to respond to emergencies See telemetry face sheet for immediately available ER MD    Location [facility]-Cardiac & Pulmonary Rehab    Staff Present Heath Lark, RN, BSN, CCRP;Jessica [place], MA, RCEP, CCRP, Marylynn Pearson, MS, ASCM CEP, Exercise Physiologist;Joseph [place], [place]    Virtual Visit No    Medication changes reported     No    Fall or balance concerns reported    No    Warm-up and Cool-down Performed on first and last piece of equipment    Resistance Training Performed Yes    VAD Patient? No    PAD/SET Patient? No      Pain Assessment   Currently in Pain? No/denies               Exercise Prescription Changes - 04/05/22 0800       Home Exercise Plan   Plans to continue exercise at Home (comment)   walking, recumbent bike, bands, weights   Frequency Add 2 additional days to program exercise sessions.    Initial Home Exercises Provided 04/05/22             Social History   Tobacco Use  Smoking Status Never  Smokeless Tobacco Never    Goals Met:  Proper associated with RPD/PD & O2 Sat Independence with exercise equipment Exercise tolerated well No report of concerns or symptoms today  Goals Unmet:  Not Applicable  Comments: Pt able to follow exercise prescription today without complaint.  Will continue to monitor for progression.    Dr. Emily Filbert is Medical Director for [place].  Dr. Ottie Glazier is Medical Director for [facility] Pulmonary Rehabilitation.

## 2022-04-06 ENCOUNTER — Other Ambulatory Visit: Payer: Self-pay | Admitting: Pulmonary Disease

## 2022-04-07 NOTE — Addendum Note (Signed)
Addended by: Rodman Pickle on: 04/07/2022 02:06 AM   Modules accepted: Orders

## 2022-04-10 ENCOUNTER — Encounter: Payer: Medicare Other | Admitting: *Deleted

## 2022-04-10 DIAGNOSIS — J449 Chronic obstructive pulmonary disease, unspecified: Secondary | ICD-10-CM

## 2022-04-10 DIAGNOSIS — R5383 Other fatigue: Secondary | ICD-10-CM | POA: Diagnosis not present

## 2022-04-10 DIAGNOSIS — R0602 Shortness of breath: Secondary | ICD-10-CM | POA: Diagnosis not present

## 2022-04-10 NOTE — Progress Notes (Signed)
Daily Session Note  Patient Details  Name: Ruth White MRN: [id number] Date of Birth: Oct 09, 1960 Referring Provider:   Flowsheet Row Pulmonary Rehab from 02/26/2022 in [facility] Cardiac and Pulmonary Rehab  Referring Provider Margaretha Seeds MD       Encounter Date: 04/10/2022  Check In:  Session Check In - 04/10/22 0801       Check-In   Supervising physician immediately available to respond to emergencies See telemetry face sheet for immediately available ER MD    Location [facility]-Cardiac & Pulmonary Rehab    Staff Present Heath Lark, RN, BSN, CCRP;Jessica [place], MA, RCEP, CCRP, Marylynn Pearson, MS, ASCM CEP, Exercise Physiologist    Virtual Visit No    Medication changes reported     No    Fall or balance concerns reported    No    Warm-up and Cool-down Performed on first and last piece of equipment    Resistance Training Performed Yes    VAD Patient? No    PAD/SET Patient? No      Pain Assessment   Currently in Pain? No/denies                Social History   Tobacco Use  Smoking Status Never  Smokeless Tobacco Never    Goals Met:  Proper associated with RPD/PD & O2 Sat Independence with exercise equipment Exercise tolerated well No report of concerns or symptoms today  Goals Unmet:  Not Applicable  Comments: Pt able to follow exercise prescription today without complaint.  Will continue to monitor for progression.    Dr. Emily Filbert is Medical Director for [place].  Dr. Ottie Glazier is Medical Director for [facility] Pulmonary Rehabilitation.

## 2022-04-11 ENCOUNTER — Encounter: Payer: Medicare Other | Admitting: *Deleted

## 2022-04-11 DIAGNOSIS — R5383 Other fatigue: Secondary | ICD-10-CM | POA: Diagnosis not present

## 2022-04-11 DIAGNOSIS — R0602 Shortness of breath: Secondary | ICD-10-CM | POA: Diagnosis not present

## 2022-04-11 DIAGNOSIS — J449 Chronic obstructive pulmonary disease, unspecified: Secondary | ICD-10-CM | POA: Diagnosis not present

## 2022-04-11 NOTE — Progress Notes (Signed)
Daily Session Note  Patient Details  Name: Ruth White MRN: [id number] Date of Birth: 04/15/1961 Referring Provider:   Flowsheet Row Pulmonary Rehab from 02/26/2022 in [facility] Cardiac and Pulmonary Rehab  Referring Provider Margaretha Seeds MD       Encounter Date: 04/11/2022  Check In:  Session Check In - 04/11/22 0800       Check-In   Supervising physician immediately available to respond to emergencies See telemetry face sheet for immediately available ER MD    Location [facility]-Cardiac & Pulmonary Rehab    Staff Present Antionette Fairy, BS, Exercise Physiologist;Joseph [place] Poles, RN, [place]    Virtual Visit No    Medication changes reported     No    Fall or balance concerns reported    No    Warm-up and Cool-down Performed on first and last piece of equipment    Resistance Training Performed Yes    VAD Patient? No    PAD/SET Patient? No      Pain Assessment   Currently in Pain? No/denies                Social History   Tobacco Use  Smoking Status Never  Smokeless Tobacco Never    Goals Met:  Independence with exercise equipment Exercise tolerated well No report of concerns or symptoms today Strength training completed today  Goals Unmet:  Not Applicable  Comments: Pt able to follow exercise prescription today without complaint.  Will continue to monitor for progression.    Dr. Emily Filbert is Medical Director for [place].  Dr. Ottie Glazier is Medical Director for [facility] Pulmonary Rehabilitation.

## 2022-04-12 DIAGNOSIS — M7541 Impingement syndrome of right shoulder: Secondary | ICD-10-CM | POA: Diagnosis not present

## 2022-04-12 DIAGNOSIS — M7542 Impingement syndrome of left shoulder: Secondary | ICD-10-CM | POA: Diagnosis not present

## 2022-04-13 DIAGNOSIS — H9193 Unspecified hearing loss, bilateral: Secondary | ICD-10-CM | POA: Diagnosis not present

## 2022-04-13 DIAGNOSIS — H6993 Unspecified Eustachian tube disorder, bilateral: Secondary | ICD-10-CM | POA: Diagnosis not present

## 2022-04-18 ENCOUNTER — Encounter: Payer: Self-pay | Admitting: *Deleted

## 2022-04-18 DIAGNOSIS — J449 Chronic obstructive pulmonary disease, unspecified: Secondary | ICD-10-CM

## 2022-04-18 NOTE — Progress Notes (Signed)
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 recumbent bike. We will continue to monitor until we hear back from PT. Reviewed home exercise with pt today.  Pt plans to use bands and weights at home for exercise.  She also has a recumbent bike to use at  home and walk with husband.  Reviewed THR, pulse, RPE, sign and symptoms, pulse oximetery and when to call 911 or MD.  Also discussed weather considerations and indoor options.  Pt voiced understanding. Eulalah is doing well in rehab. While she has not increased her workloads on any machines yet, she has increased her average MET level to 2.21 METs. She also has tolerated using 3 lb weights for resistance training. We will continue to monitor her progress in the program.    Expected Outcomes Short: Use RPE daily to  regulate intensity. Long: Follow program prescription in THR. Short: Continue to follow exercise prescription safetly Long: Build up strength and stamina SHort; Start to add in exercise at home Long: Continue to build strength Short: Begin to increase workloads on seated machines. Long: Continue to increase strength and stamina.             Discharge Exercise Prescription (Final Exercise Prescription Changes):  Exercise Prescription Changes - 04/10/22 1300       Response to Exercise   Blood Pressure (Admit) 116/64    Blood Pressure (Exercise) 136/64    Blood Pressure (Exit) 122/60    Heart Rate (Admit) 83 bpm    Heart Rate (Exercise) 112 bpm    Heart Rate (Exit) 95 bpm    Oxygen Saturation (Admit) 97 %    Oxygen Saturation (Exercise) 92 %    Oxygen Saturation (Exit) 97 %    Rating of Perceived Exertion (Exercise) 13    Perceived Dyspnea (Exercise) 2    Symptoms SOB, fatigue    Duration Progress to 30 minutes of  aerobic without signs/symptoms of physical distress    Intensity THRR unchanged      Progression   Progression Continue to progress workloads to maintain intensity without signs/symptoms of physical distress.    Average METs 2.21      Resistance Training   Training Prescription Yes    Weight 3 lb    Reps 10-15      Interval Training   Interval Training No      Oxygen   Oxygen Continuous    Liters 2      Recumbant Bike   Level 1    Watts 19    Minutes 15    METs 2.73      NuStep   Level 1    Minutes 15      T5 Nustep   Level 1    Minutes 15    METs 1.9      Biostep-RELP   Level 1    Minutes 15    METs 2      Home Exercise Plan   Plans to continue exercise at Home (comment)   walking, recumbent bike, bands, weights   Frequency Add 2 additional days to program exercise sessions.    Initial Home Exercises Provided 04/05/22      Oxygen   Maintain Oxygen Saturation 88% or higher             Nutrition:  Target Goals: Understanding of  nutrition guidelines, daily intake of sodium <1567m, cholesterol <2056m calories 30% from fat and 7% or less from saturated fats, daily to have 5 or more servings of fruits and vegetables.  Education: All About Nutrition: -Group instruction provided by verbal, written material, interactive activities, discussions, models, and posters to present general guidelines for heart healthy nutrition including fat, fiber, MyPlate, the role of sodium in heart healthy nutrition, utilization of the nutrition label, and utilization  of this knowledge for meal planning. Follow up email sent as well. Written material given at graduation.   Biometrics:  Pre Biometrics - 02/26/22 1202       Pre Biometrics   Height 5' 3.5" (1.613 m)    Weight 186 lb 8 oz (84.6 kg)    Waist Circumference 34.5 inches    Hip Circumference 35.5 inches    Waist to Hip Ratio 0.97 %    BMI (Calculated) 32.51    Single Leg Stand 0 seconds              Nutrition Therapy Plan and Nutrition Goals:  Nutrition Therapy & Goals - 03/26/22 0904       Nutrition Therapy   Diet Heart healthy, low Na    Drug/Food Interactions Statins/Certain Fruits    Protein (specify units) 100-105g    Whole Grain Foods 2 servings   Her food intake is limited due to appetite   Fruits and Vegetables 5 servings/day   Her food intake is limited due to appetite   Sodium 2 grams      Personal Nutrition Goals   Nutrition Goal ST: consider adding in a nutritional supplement, practice mechanical eating, eat protein foods first, aim to include fiber, fat, and protein at most meals. LT: Meet energy/protein needs    Comments 61 y.o. F admitted to pulmonary rehab for stage 3 severe COPD by GOLD classification. PMHx breast cancer s/p chemotherapy and radiation as of 2022, HTN, atherosclerosis, T2DM. Relevant medications includes farxiga, metformin, zofran, nucynta, prochlorperazine, crestor, zoloft, cymbalta, tirzepatide, cyclobenzaprine, hydrochlorothiazide,  oxycodone, senokot, lyrica, senokot, fluconazole, anastrozole. She reports limiting/avoiding carbohydrates as she is nervous they will turn to fat; discussed how carbohydrates packaged with fiber like fruit, starchy vegetables, and whole grains can contribute energy rich carbohydrates, fiber, as well as vitamins and minerals. She is interested in including these foods again. Since her chemotherapy she has not had any taste, but some of it is coming back such as sweet and salty flavors - she feels almost too well; a hard candy was too sweet for her and she had to spit it out. She reports that her appetite has been lower overall since the chemotherapy: good day she will have two meals and a bad day she may only have one meal like 1/2 cup of cabbage. B: egg white with whole grain toast or egg white with cheese D: Her husband cooks for her. She reports that instead of spaghetti she will use spaghetti squash, taco salad with ground [place], fish on the grill. She reports that she has lost some weight recently, she has been trying to lose; discussed how meeting calorie and protein needs is important. Discussed mechanical eating, how to include more calories with lower volume, eat protein rich foods first, and consider nutritional supplements. Discussed heart healthy eating, pulmonary MNT, as well as T2DM MNT.      Intervention Plan   Intervention Prescribe, educate and counsel regarding individualized specific dietary modifications aiming towards targeted core components such as weight, hypertension, lipid management, diabetes, heart failure and other comorbidities.    Expected Outcomes Short Term Goal: Understand basic principles of dietary content, such as calories, fat, sodium, cholesterol and nutrients.;Short Term Goal: A plan has been developed with personal nutrition goals set during dietitian appointment.;Long Term Goal: Adherence to prescribed nutrition plan.             Nutrition  Assessments:  MEDIFICTS Score Key: ?70 Need to make dietary changes  40-70  Heart Healthy Diet ? 40 Therapeutic Level Cholesterol Diet  Flowsheet Row Pulmonary Rehab from 02/26/2022 in [facility] Cardiac and Pulmonary Rehab  Picture Your Plate Total Score on Admission 75      Picture Your Plate Scores: <54 Unhealthy dietary pattern with much room for improvement. 41-50 Dietary pattern unlikely to meet recommendations for good health and room for improvement. 51-60 More healthful dietary pattern, with some room for improvement.  >60 Healthy dietary pattern, although there may be some specific behaviors that could be improved.   Nutrition Goals Re-Evaluation:   Nutrition Goals Discharge (Final Nutrition Goals Re-Evaluation):   Psychosocial: Target Goals: Acknowledge presence or absence of significant depression and/or stress, maximize coping skills, provide positive support system. Participant is able to verbalize types and ability to use techniques and skills needed for reducing stress and depression.   Education: Stress, Anxiety, and Depression - Group verbal and visual presentation to define topics covered.  Reviews how body is impacted by stress, anxiety, and depression.  Also discusses healthy ways to reduce stress and to treat/manage anxiety and depression.  Written material given at graduation. Flowsheet Row Pulmonary Rehab from 04/11/2022 in [facility] Cardiac and Pulmonary Rehab  Date 04/11/22  Educator [facility]  Instruction Review Code 1- [employer] Understanding       Education: Sleep Hygiene -Provides group verbal and written instruction about how sleep can affect your health.  Define sleep hygiene, discuss sleep cycles and impact of sleep habits. Review good sleep hygiene tips.    Initial Review & Psychosocial Screening:  Initial Psych Review & Screening - 02/26/22 0748       Initial Review   Current issues with Current Depression;Current Psychotropic Meds;History of  Depression;Current Stress Concerns    Source of Stress Concerns Chronic Illness;Unable to participate in former interests or hobbies;Unable to perform yard/household activities    Comments currently on sertaline, good days and bad days mostly well managed, getting eyes fixed to be able to see again      [place]? Yes   Husband, kids (lives near by)   Comments 4 kids one calls daily , other three drop in      Barriers   Psychosocial barriers to participate in program The patient should benefit from training in stress management and relaxation.;Psychosocial barriers identified (see note)      Screening Interventions   Interventions Encouraged to exercise;To provide support and resources with identified psychosocial needs;Provide feedback about the scores to participant    Expected Outcomes Short Term goal: Utilizing psychosocial counselor, staff and physician to assist with identification of specific Stressors or current issues interfering with healing process. Setting desired goal for each stressor or current issue identified.;Long Term Goal: Stressors or current issues are controlled or eliminated.;Short Term goal: Identification and review with participant of any Quality of Life or Depression concerns found by scoring the questionnaire.;Long Term goal: The participant improves quality of Life and PHQ9 Scores as seen by post scores and/or verbalization of changes             Quality of Life Scores:  Scores of 19 and below usually indicate a poorer quality of life in these areas.  A difference of  2-3 points is a clinically meaningful difference.  A difference of 2-3 points in the total score of the Quality of Life Index has been associated with significant improvement in overall quality of life, self-image, physical symptoms, and general health in studies assessing change in quality of life.  PHQ-9: Review Flowsheet       02/26/2022  Depression screen PHQ 2/9   Decreased Interest 2  Down, Depressed, Hopeless 1  PHQ - 2 Score 3  Altered sleeping 3  Tired, decreased energy 3  Change in appetite 2  Feeling bad or failure about yourself  1  Trouble concentrating 3  Moving slowly or fidgety/restless 2  Suicidal thoughts 0  PHQ-9 Score 17  Difficult doing work/chores Somewhat difficult   Interpretation of Total Score  Total Score Depression Severity:  1-4 = Minimal depression, 5-9 = Mild depression, 10-14 = Moderate depression, 15-19 = Moderately severe depression, 20-27 = Severe depression   Psychosocial Evaluation and Intervention:   Psychosocial Re-Evaluation:  Psychosocial Re-Evaluation     Maynard Name 04/05/22 0811             Psychosocial Re-Evaluation   Current issues with Current Depression;Current Anxiety/Panic;Current Psychotropic Meds;Current Sleep Concerns;Current Stress Concerns       Comments Christin is doing well in rehab.  She has been taking her meds still and feeling pretty good.  She does not feel perfectly balanced yet, but she is working with her doctor.  She continues to struggle with her sleep as she only gets about 3-4 hrs a night and then wakes up and unable to go to sleep.  She has done melatonin before and has tried 65m and 7.5 mg but not ten yet, so she will try.  She conitnues to work on it, we talked about importance of sleep too.       Interventions Stress management education;Encouraged to attend Pulmonary Rehabilitation for the exercise       Continue Psychosocial Services  Follow up required by staff                Psychosocial Discharge (Final Psychosocial Re-Evaluation):  Psychosocial Re-Evaluation - 04/05/22 0811       Psychosocial Re-Evaluation   Current issues with Current Depression;Current Anxiety/Panic;Current Psychotropic Meds;Current Sleep Concerns;Current Stress Concerns    Comments DAirlieis doing well in rehab.  She has been taking her meds still and feeling pretty good.  She does not feel  perfectly balanced yet, but she is working with her doctor.  She continues to struggle with her sleep as she only gets about 3-4 hrs a night and then wakes up and unable to go to sleep.  She has done melatonin before and has tried 541mand 7.5 mg but not ten yet, so she will try.  She conitnues to work on it, we talked about importance of sleep too.    Interventions Stress management education;Encouraged to attend Pulmonary Rehabilitation for the exercise    Continue Psychosocial Services  Follow up required by staff             Education: Education Goals: Education classes will be provided on a weekly basis, covering required topics. Participant will state understanding/return demonstration of topics presented.  Learning Barriers/Preferences:  Learning Barriers/Preferences - 02/26/22 0747       Learning Barriers/Preferences   Learning Barriers Sight   eye lids drooping   Learning Preferences Skilled Demonstration;Verbal Instruction             General Pulmonary Education Topics:  Infection Prevention: - Provides verbal and written material to individual with discussion of infection control including proper hand washing and proper equipment cleaning during exercise session. Flowsheet Row Pulmonary Rehab from [facility]11/2022 in [facility]  Date 02/26/22  [facility]  Review Code 1- Verbalizes Understanding       Falls Prevention: - Provides verbal and written material to individual with discussion of falls prevention and safety. Flowsheet Row Pulmonary Rehab from 04/11/2022 in [facility] Cardiac and Pulmonary Rehab  Date 02/26/22  Educator [facility]  Instruction Review Code 1- Verbalizes Understanding       Chronic Lung Disease Review: - Group verbal instruction with posters, models, PowerPoint presentations and videos,  to review new updates, new respiratory medications, new advancements in procedures and treatments. Providing information on websites  and "800" numbers for continued self-education. Includes information about supplement oxygen, available portable oxygen systems, continuous and intermittent flow rates, oxygen safety, concentrators, and Medicare reimbursement for oxygen. Explanation of Pulmonary Drugs, including class, frequency, complications, importance of spacers, rinsing mouth after steroid MDI's, and proper cleaning methods for nebulizers. Review of basic lung anatomy and physiology related to function, structure, and complications of lung disease. Review of risk factors. Discussion about methods for diagnosing sleep apnea and types of masks and machines for OSA. Includes a review of the use of types of environmental controls: home humidity, furnaces, filters, dust mite/pet prevention, HEPA vacuums. Discussion about weather changes, air quality and the benefits of nasal washing. Instruction on Warning signs, infection symptoms, calling MD promptly, preventive modes, and value of vaccinations. Review of effective airway clearance, coughing and/or vibration techniques. Emphasizing that all should Create an Action Plan. Written material given at graduation. Flowsheet Row Pulmonary Rehab from 04/11/2022 in [facility] Cardiac and Pulmonary Rehab  Education need identified 02/26/22  Date 04/05/22  Educator [facility]  Instruction Review Code 1- Verbalizes Understanding       AED/CPR: - Group verbal and written instruction with the use of models to demonstrate the basic use of the AED with the basic ABC's of resuscitation.    Anatomy and Cardiac Procedures: - Group verbal and visual presentation and models provide information about basic cardiac anatomy and function. Reviews the testing methods done to diagnose heart disease and the outcomes of the test results. Describes the treatment choices: Medical Management, Angioplasty, or Coronary Bypass Surgery for treating various heart conditions including Myocardial Infarction, Angina, Valve Disease, and  Cardiac Arrhythmias.  Written material given at graduation.   Medication Safety: - Group verbal and visual instruction to review commonly prescribed medications for heart and lung disease. Reviews the medication, class of the drug, and side effects. Includes the steps to properly store meds and maintain the prescription regimen.  Written material given at graduation. Flowsheet Row Pulmonary Rehab from 04/11/2022 in [facility] Cardiac and Pulmonary Rehab  Date 03/22/22  Educator SB  Instruction Review Code 1- Verbalizes Understanding       Other: -Provides group and verbal instruction on various topics (see comments)   Knowledge Questionnaire Score:  Knowledge Questionnaire Score - 02/26/22 1204       Knowledge Questionnaire Score   Pre Score 12/18              Core Components/Risk Factors/Patient Goals at Admission:  Personal Goals and Risk Factors at Admission - 02/26/22 0751       Core Components/Risk Factors/Patient Goals on Admission    Weight Management Yes;Weight Loss;Obesity    Intervention Weight Management: Develop a combined nutrition and exercise program designed to reach desired caloric intake, while maintaining appropriate intake of nutrient and fiber, sodium and fats, and appropriate energy expenditure required for the weight goal.;Weight Management: Provide education and appropriate resources to help participant work on and attain dietary goals.;Weight Management/Obesity:  Establish reasonable short term and long term weight goals.;Obesity: Provide education and appropriate resources to help participant work on and attain dietary goals.    Admit Weight 186 lb 8 oz (84.6 kg)    Goal Weight: Short Term 182 lb (82.6 kg)    Goal Weight: Long Term 180 lb (81.6 kg)    Expected Outcomes Short Term: Continue to assess and modify interventions until short term weight is achieved;Long Term: Adherence to nutrition and physical activity/exercise program aimed toward attainment of  established weight goal;Weight Loss: Understanding of general recommendations for a balanced deficit meal plan, which promotes 1-2 lb weight loss per week and includes a negative energy balance of [phone] kcal/d;Understanding recommendations for meals to include 15-35% energy as protein, 25-35% energy from fat, 35-60% energy from carbohydrates, less than 282m of dietary cholesterol, 20-35 gm of total fiber daily;Understanding of distribution of calorie intake throughout the day with the consumption of 4-5 meals/snacks    Improve shortness of breath with ADL's Yes    Intervention Provide education, individualized exercise plan and daily activity instruction to help decrease symptoms of SOB with activities of daily living.    Expected Outcomes Short Term: Improve cardiorespiratory fitness to achieve a reduction of symptoms when performing ADLs;Long Term: Be able to perform more ADLs without symptoms or delay the onset of symptoms    Increase knowledge of respiratory medications and ability to use respiratory devices properly  Yes    Intervention Provide education and demonstration as needed of appropriate use of medications, inhalers, and oxygen therapy.    Expected Outcomes Short Term: Achieves understanding of medications use. Understands that oxygen is a medication prescribed by physician. Demonstrates appropriate use of inhaler and oxygen therapy.;Long Term: Maintain appropriate use of medications, inhalers, and oxygen therapy.    Diabetes Yes    Intervention Provide education about signs/symptoms and action to take for hypo/hyperglycemia.;Provide education about proper nutrition, including hydration, and aerobic/resistive exercise prescription along with prescribed medications to achieve blood glucose in normal ranges: Fasting glucose 65-99 mg/dL    Expected Outcomes Short Term: Participant verbalizes understanding of the signs/symptoms and immediate care of hyper/hypoglycemia, proper foot care and  importance of medication, aerobic/resistive exercise and nutrition plan for blood glucose control.;Long Term: Attainment of HbA1C < 7%.    Hypertension Yes    Intervention Provide education on lifestyle modifcations including regular physical activity/exercise, weight management, moderate sodium restriction and increased consumption of fresh fruit, vegetables, and low fat dairy, alcohol moderation, and smoking cessation.;Monitor prescription use compliance.    Expected Outcomes Short Term: Continued assessment and intervention until BP is < 140/976mHG in hypertensive participants. < 130/8018mG in hypertensive participants with diabetes, heart failure or chronic kidney disease.;Long Term: Maintenance of blood pressure at goal levels.    Lipids Yes    Intervention Provide education and support for participant on nutrition & aerobic/resistive exercise along with prescribed medications to achieve LDL <70m22mDL >40mg43m Expected Outcomes Short Term: Participant states understanding of desired cholesterol values and is compliant with medications prescribed. Participant is following exercise prescription and nutrition guidelines.;Long Term: Cholesterol controlled with medications as prescribed, with individualized exercise RX and with personalized nutrition plan. Value goals: LDL < 70mg,84m > 40 mg.             Education:Diabetes - Individual verbal and written instruction to review signs/symptoms of diabetes, desired ranges of glucose level fasting, after meals and with exercise. Acknowledge that pre and post exercise glucose checks  will be done for 3 sessions at entry of program. Flowsheet Row Pulmonary Rehab from 04/11/2022 in [facility] Cardiac and Pulmonary Rehab  Date 02/26/22  Educator [facility]  Instruction Review Code 1- Verbalizes Understanding       Know Your Numbers and Heart Failure: - Group verbal and visual instruction to discuss disease risk factors for cardiac and pulmonary disease and  treatment options.  Reviews associated critical values for Overweight/Obesity, Hypertension, Cholesterol, and Diabetes.  Discusses basics of heart failure: signs/symptoms and treatments.  Introduces Heart Failure Zone chart for action plan for heart failure.  Written material given at graduation. Flowsheet Row Pulmonary Rehab from 04/11/2022 in [facility] Cardiac and Pulmonary Rehab  Date 03/29/22  Educator SB  Instruction Review Code 1- Verbalizes Understanding       Core Components/Risk Factors/Patient Goals Review:   Goals and Risk Factor Review     Row Name 04/05/22 0815             Core Components/Risk Factors/Patient Goals Review   Personal Goals Review Weight Management/Obesity;Improve shortness of breath with ADL's;Increase knowledge of respiratory medications and ability to use respiratory devices properly.;Diabetes;Hypertension;Lipids       Review Anabell is doing well in rehab.  Her weight has been trending down.  Her breathing is getting better for most part but still has harder days.  She is doing well with her inhalers and nebulizers.  Her sugars are doing well around 120s in the morning.  Her pressures have been staying good.  She continues to check both at home.       Expected Outcomes Short: Conintue to work on breathing Long: Conitnue to monitor risk factors                Core Components/Risk Factors/Patient Goals at Discharge (Final Review):   Goals and Risk Factor Review - 04/05/22 0815       Core Components/Risk Factors/Patient Goals Review   Personal Goals Review Weight Management/Obesity;Improve shortness of breath with ADL's;Increase knowledge of respiratory medications and ability to use respiratory devices properly.;Diabetes;Hypertension;Lipids    Review Kiaraliz is doing well in rehab.  Her weight has been trending down.  Her breathing is getting better for most part but still has harder days.  She is doing well with her inhalers and nebulizers.  Her sugars are  doing well around 120s in the morning.  Her pressures have been staying good.  She continues to check both at home.    Expected Outcomes Short: Conintue to work on breathing Long: Conitnue to monitor risk factors             ITP Comments:  ITP Comments     Row Name 02/26/22 1155 03/21/22 0723 03/26/22 1439 03/27/22 1545 04/18/22 0935   ITP Comments Completed 6MWT and gym orientation. Initial ITP created and sent for review to Dr. Zetta Bills, Medical Director.  Documentation for diagnosis can be found in [facility] encounter 01/12/22. 30 Day review completed. Medical Director ITP review done, changes made as directed, and signed approval by Medical Director.    New to program Completed initial RD consultation Staff felt that patient would better benefit from going to Physical Therapy first prior to finishing rehab.  She would do better in rehab if she could build up her strength and balance in physical therapy.  Note sent to MD to request PT referral. We will continue with rehab until set up.  Staff spoke with Jadie and her husband and they both agreed and voiced  understanding. 30 Day review completed. Medical Director ITP review done, changes made as directed, and signed approval by Medical Director.    Girtrude has continued PR and is showing improvement with independence of movement            Comments:

## 2022-04-19 ENCOUNTER — Encounter: Payer: HMO | Attending: Pulmonary Disease | Admitting: *Deleted

## 2022-04-19 DIAGNOSIS — J449 Chronic obstructive pulmonary disease, unspecified: Secondary | ICD-10-CM | POA: Diagnosis not present

## 2022-04-19 DIAGNOSIS — M6281 Muscle weakness (generalized): Secondary | ICD-10-CM | POA: Diagnosis not present

## 2022-04-19 NOTE — Progress Notes (Signed)
Daily Session Note  Patient Details  Name: Ruth White MRN: [id number] Date of Birth: October 15, 1960 Referring Provider:   Flowsheet Row Pulmonary Rehab from 02/26/2022 in [facility] Cardiac and Pulmonary Rehab  Referring Provider Margaretha Seeds MD       Encounter Date: 04/19/2022  Check In:  Session Check In - 04/19/22 0806       Check-In   Supervising physician immediately available to respond to emergencies See telemetry face sheet for immediately available ER MD    Location [facility]-Cardiac & Pulmonary Rehab    Staff Present Coralie Keens, MS, ASCM CEP, Exercise Physiologist;Karthika Glasper Tamala Julian, RN, Terie Purser, RCP,RRT,BSRT    Virtual Visit No    Medication changes reported     Yes    Comments Taking last dose of prednisone today    Fall or balance concerns reported    No    Warm-up and Cool-down Performed on first and last piece of equipment    Resistance Training Performed Yes    VAD Patient? No    PAD/SET Patient? No      Pain Assessment   Currently in Pain? No/denies                Social History   Tobacco Use  Smoking Status Never  Smokeless Tobacco Never    Goals Met:  Independence with exercise equipment Exercise tolerated well No report of concerns or symptoms today Strength training completed today  Goals Unmet:  Not Applicable  Comments: Pt able to follow exercise prescription today without complaint.  Will continue to monitor for progression.    Dr. Emily Filbert is Medical Director for [place].  Dr. Ottie Glazier is Medical Director for [facility] Pulmonary Rehabilitation.

## 2022-04-24 DIAGNOSIS — M25561 Pain in right knee: Secondary | ICD-10-CM | POA: Diagnosis not present

## 2022-04-24 DIAGNOSIS — M25511 Pain in right shoulder: Secondary | ICD-10-CM | POA: Diagnosis not present

## 2022-04-24 DIAGNOSIS — Z6832 Body mass index (BMI) 32.0-32.9, adult: Secondary | ICD-10-CM | POA: Diagnosis not present

## 2022-04-24 DIAGNOSIS — R296 Repeated falls: Secondary | ICD-10-CM | POA: Diagnosis not present

## 2022-04-25 DIAGNOSIS — K5900 Constipation, unspecified: Secondary | ICD-10-CM | POA: Diagnosis not present

## 2022-04-25 DIAGNOSIS — R935 Abnormal findings on diagnostic imaging of other abdominal regions, including retroperitoneum: Secondary | ICD-10-CM | POA: Diagnosis not present

## 2022-05-01 ENCOUNTER — Encounter: Payer: HMO | Admitting: *Deleted

## 2022-05-01 DIAGNOSIS — J449 Chronic obstructive pulmonary disease, unspecified: Secondary | ICD-10-CM | POA: Diagnosis not present

## 2022-05-01 NOTE — Progress Notes (Signed)
Daily Session Note  Patient Details  Name: Ruth White MRN: [id number] Date of Birth: 29-Apr-1961 Referring Provider:   Flowsheet Row Pulmonary Rehab from 02/26/2022 in [facility] Cardiac and Pulmonary Rehab  Referring Provider Margaretha Seeds MD       Encounter Date: 05/01/2022  Check In:  Session Check In - 05/01/22 0836       Check-In   Supervising physician immediately available to respond to emergencies See telemetry face sheet for immediately available ER MD    Location [facility]-Cardiac & Pulmonary Rehab    Staff Present Heath Lark, RN, BSN, CCRP;Jessica Elwood, MA, RCEP, CCRP, Marylynn Pearson, MS, ASCM CEP, Exercise Physiologist    Virtual Visit No    Medication changes reported     No    Fall or balance concerns reported    No    Warm-up and Cool-down Performed on first and last piece of equipment    Resistance Training Performed Yes    VAD Patient? No    PAD/SET Patient? No      Pain Assessment   Currently in Pain? No/denies                Social History   Tobacco Use  Smoking Status Never  Smokeless Tobacco Never    Goals Met:  Proper associated with RPD/PD & O2 Sat Independence with exercise equipment Exercise tolerated well No report of concerns or symptoms today  Goals Unmet:  Not Applicable  Comments: Pt able to follow exercise prescription today without complaint.  Will continue to monitor for progression.    Dr. Emily Filbert is Medical Director for [place].  Dr. Ottie Glazier is Medical Director for [facility] Pulmonary Rehabilitation.

## 2022-05-02 DIAGNOSIS — G629 Polyneuropathy, unspecified: Secondary | ICD-10-CM | POA: Diagnosis not present

## 2022-05-02 DIAGNOSIS — Z1389 Encounter for screening for other disorder: Secondary | ICD-10-CM | POA: Diagnosis not present

## 2022-05-02 DIAGNOSIS — Z79899 Other long term (current) drug therapy: Secondary | ICD-10-CM | POA: Diagnosis not present

## 2022-05-02 DIAGNOSIS — G894 Chronic pain syndrome: Secondary | ICD-10-CM | POA: Diagnosis not present

## 2022-05-03 ENCOUNTER — Encounter: Payer: HMO | Admitting: *Deleted

## 2022-05-03 DIAGNOSIS — J449 Chronic obstructive pulmonary disease, unspecified: Secondary | ICD-10-CM | POA: Diagnosis not present

## 2022-05-03 NOTE — Progress Notes (Signed)
Daily Session Note  Patient Details  Name: Ruth White MRN: [id number] Date of Birth: April 09, 1961 Referring Provider:   Flowsheet Row Pulmonary Rehab from 02/26/2022 in [facility] Cardiac and Pulmonary Rehab  Referring Provider Margaretha Seeds MD       Encounter Date: 05/03/2022  Check In:  Session Check In - 05/03/22 0747       Check-In   Supervising physician immediately available to respond to emergencies See telemetry face sheet for immediately available ER MD    Location [facility]-Cardiac & Pulmonary Rehab    Staff Present Alberteen Sam, MA, RCEP, CCRP, CCET;Joseph [place], [place], RN, [place]    Virtual Visit No    Medication changes reported     Yes    Comments started amitriptyline    Fall or balance concerns reported    No    Warm-up and Cool-down Performed on first and last piece of equipment    Resistance Training Performed Yes    VAD Patient? No    PAD/SET Patient? No      Pain Assessment   Currently in Pain? No/denies                Social History   Tobacco Use  Smoking Status Never  Smokeless Tobacco Never    Goals Met:  Independence with exercise equipment Exercise tolerated well No report of concerns or symptoms today Strength training completed today  Goals Unmet:  Not Applicable  Comments: Pt able to follow exercise prescription today without complaint.  Will continue to monitor for progression.    Dr. Emily Filbert is Medical Director for [place].  Dr. Ottie Glazier is Medical Director for [facility] Pulmonary Rehabilitation.

## 2022-05-07 ENCOUNTER — Encounter: Payer: HMO | Admitting: *Deleted

## 2022-05-07 DIAGNOSIS — J449 Chronic obstructive pulmonary disease, unspecified: Secondary | ICD-10-CM | POA: Diagnosis not present

## 2022-05-07 NOTE — Progress Notes (Signed)
Daily Session Note  Patient Details  Name: Ruth White MRN: [id number] Date of Birth: 04-10-1961 Referring Provider:   Flowsheet Row Pulmonary Rehab from 02/26/2022 in [facility] Cardiac and Pulmonary Rehab  Referring Provider Margaretha Seeds MD       Encounter Date: 05/07/2022  Check In:  Session Check In - 05/07/22 0748       Check-In   Supervising physician immediately available to respond to emergencies See telemetry face sheet for immediately available ER MD    Location [facility]-Cardiac & Pulmonary Rehab    Staff Present Alberteen Sam, MA, RCEP, CCRP, Mindi Curling, RN, Doyce Para, BS, ACSM CEP, Exercise Physiologist    Virtual Visit No    Medication changes reported     No    Fall or balance concerns reported    No    Warm-up and Cool-down Performed on first and last piece of equipment    Resistance Training Performed Yes    VAD Patient? No    PAD/SET Patient? No      Pain Assessment   Currently in Pain? No/denies                Social History   Tobacco Use  Smoking Status Never  Smokeless Tobacco Never    Goals Met:  Independence with exercise equipment Exercise tolerated well No report of concerns or symptoms today Strength training completed today  Goals Unmet:  Not Applicable  Comments: Pt able to follow exercise prescription today without complaint.  Will continue to monitor for progression.    Dr. Emily Filbert is Medical Director for [place].  Dr. Ottie Glazier is Medical Director for [facility] Pulmonary Rehabilitation.

## 2022-05-16 ENCOUNTER — Encounter: Payer: Self-pay | Admitting: *Deleted

## 2022-05-16 ENCOUNTER — Encounter: Payer: HMO | Admitting: *Deleted

## 2022-05-16 DIAGNOSIS — J449 Chronic obstructive pulmonary disease, unspecified: Secondary | ICD-10-CM

## 2022-05-16 NOTE — Progress Notes (Signed)
Daily Session Note  Patient Details  Name: Ruth White MRN: [id number] Date of Birth: 09-Dec-1960 Referring Provider:   Flowsheet Row Pulmonary Rehab from 02/26/2022 in [facility] Cardiac and Pulmonary Rehab  Referring Provider Margaretha Seeds MD       Encounter Date: 05/16/2022  Check In:  Session Check In - 05/16/22 0834       Check-In   Supervising physician immediately available to respond to emergencies See telemetry face sheet for immediately available ER MD    Location [facility]-Cardiac & Pulmonary Rehab    Staff Present Antionette Fairy, BS, Exercise Physiologist;Jessica Chelsea, MA, RCEP, CCRP, Mindi Curling, RN, [place]    Virtual Visit No    Medication changes reported     No    Fall or balance concerns reported    No    Warm-up and Cool-down Performed on first and last piece of equipment    Resistance Training Performed Yes    VAD Patient? No    PAD/SET Patient? No      Pain Assessment   Currently in Pain? No/denies                Social History   Tobacco Use  Smoking Status Never  Smokeless Tobacco Never    Goals Met:  Independence with exercise equipment Exercise tolerated well No report of concerns or symptoms today Strength training completed today  Goals Unmet:  Not Applicable  Comments: Pt able to follow exercise prescription today without complaint.  Will continue to monitor for progression.    Dr. Emily Filbert is Medical Director for [place].  Dr. Ottie Glazier is Medical Director for [facility] Pulmonary Rehabilitation.

## 2022-05-16 NOTE — Progress Notes (Signed)
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 functional capacity (6MWT)       Able to understand and use rate of perceived exertion (RPE) scale Yes       Intervention Provide education and explanation on how to use RPE scale       Expected Outcomes Short Term: Able to use RPE daily in rehab to express subjective intensity level;Long Term:  Able to use RPE to guide intensity level when exercising independently       Able to understand and use Dyspnea scale Yes       Intervention Provide education and explanation on how to use Dyspnea scale       Expected Outcomes Short Term: Able to use Dyspnea scale daily in rehab to express subjective sense of shortness of breath during exertion;Long Term: Able to use Dyspnea scale to guide intensity level when exercising  independently       Knowledge and understanding of Target Heart Rate Range (THRR) Yes       Intervention Provide education and explanation of THRR including how the numbers were predicted and where they are located for reference       Expected Outcomes Short Term: Able to state/look up THRR;Short Term: Able to use daily as guideline for intensity in rehab;Long Term: Able to use THRR to govern intensity when exercising independently       Able to check pulse independently Yes       Intervention Provide education and demonstration on how to check pulse in carotid and radial arteries.;Review the importance of being able to check your own pulse for safety during independent exercise       Expected Outcomes Long Term: Able to check pulse independently and accurately;Short Term: Able to explain why pulse checking is important during independent exercise       Understanding of Exercise Prescription Yes       Intervention Provide education, explanation, and written materials on patient's individual exercise prescription       Expected Outcomes Short Term: Able to explain program exercise prescription;Long Term: Able to explain home exercise prescription to exercise independently                Exercise Goals Re-Evaluation :  Exercise Goals Re-Evaluation     Row Name 03/01/22 0934 03/27/22 1544 04/05/22 0810 04/10/22 1402 04/25/22 1107     Exercise Goal Re-Evaluation   Exercise Goals Review Able to understand and use rate of perceived exertion (RPE) scale;Knowledge and understanding of Target Heart Rate Range (THRR);Able to understand and use Dyspnea scale;Understanding of Exercise Prescription Increase Physical Activity;Increase Strength and Stamina;Understanding of Exercise Prescription Increase Physical Activity;Increase Strength and Stamina;Understanding of Exercise Prescription;Able to understand and use rate of perceived exertion (RPE) scale;Knowledge and understanding of Target Heart Rate Range  (THRR);Able to understand and use Dyspnea scale;Able to check pulse independently Increase Physical Activity;Increase Strength and Stamina;Understanding of Exercise Prescription Increase Physical Activity;Increase Strength and Stamina;Understanding of Exercise Prescription   Comments Reviewed RPE and dyspnea  scales, THR and program prescription with pt today.  Pt voiced understanding and was given a copy of goals to take home. Danyle has had a good start to rehab. It is difficult for her to move around as she lacks the strength, it is also difficult for her to perform on the machines. Staff discussed with patient and thought she would benefit more from completing PT first, then coming back to pulmonary rehab. Referral was sent. In the meantime, patient will continue her exercise with [place] until she transfers over. She was able to try the T5 Nustep,  T4 Nustep, and recumbent bike. We will continue to monitor until we hear back from PT. Reviewed home exercise with pt today.  Pt plans to use bands and weights at home for exercise.  She also has a recumbent bike to use at home and walk with husband.  Reviewed THR, pulse, RPE, sign and symptoms, pulse oximetery and when to call 911 or MD.  Also discussed weather considerations and indoor options.  Pt voiced understanding. Sheera is doing well in rehab. While she has not increased her workloads on any machines yet, she has increased her average MET level to 2.21 METs. She also has tolerated using 3 lb weights for resistance training. We will continue to monitor her progress in the program. Chyann continues to do well in rehab. She was able to increase her workload to level 3 on the T4 Nustep for part of the time. It was challenging for her but happy she was able to do it. She also walked 11 laps on the track which was her first time walking. We will continue to monitor.   Expected Outcomes Short: Use RPE daily to regulate intensity. Long: Follow program prescription in THR.  Short: Continue to follow exercise prescription safetly Long: Build up strength and stamina SHort; Start to add in exercise at home Long: Continue to build strength Short: Begin to increase workloads on seated machines. Long: Continue to increase strength and stamina. Short: Work on T4 Nustep at level 3 the entire 15 minutes Long: Continue to increase overall MET level    Row Name 05/01/22 0737 05/08/22 1347           Exercise Goal Re-Evaluation   Exercise Goals Review Increase Physical Activity;Increase Strength and Stamina;Understanding of Exercise Prescription Increase Physical Activity;Increase Strength and Stamina;Understanding of Exercise Prescription      Comments Zikeria missed the last couple of weeks due to family medical concerns and then a fall at home.  Prior to her fall she was using her bike for 20 min each day and 10 min worth of weights each day.  She  has also been working on her breathing too. Michiko is doing well in rehab since returning from her fall. She was able to tolerate the T4 at level 3 for 30 minutes. She also was able to work at an average overall MET level of 2.27 METs. She did decrease her hand weights from 3 lb to 1 lb hand weights. We will continue to monitor her progress in the program.      Expected Outcomes Short: Get back to exercise routine again Long: Continue to improve stamina Short: Continue to try walking more. Long: Continue to increase strength and stamina.               Discharge Exercise Prescription (Final Exercise Prescription Changes):  Exercise Prescription Changes - 05/08/22 1300       Response to Exercise   Blood  Pressure (Admit) 112/74    Blood Pressure (Exit) 122/72    Heart Rate (Admit) 89 bpm    Heart Rate (Exercise) 102 bpm    Heart Rate (Exit) 94 bpm    Oxygen Saturation (Admit) 99 %    Oxygen Saturation (Exercise) 96 %    Oxygen Saturation (Exit) 98 %    Rating of Perceived Exertion (Exercise) 15    Perceived Dyspnea (Exercise)  3    Symptoms SOB    Duration Continue with 30 min of aerobic exercise without signs/symptoms of physical distress.    Intensity THRR unchanged      Progression   Progression Continue to progress workloads to maintain intensity without signs/symptoms of physical distress.    Average METs 2.27      Resistance Training   Training Prescription Yes    Weight 2 lb    Reps 10-15      Oxygen   Oxygen Continuous    Liters 2      Recumbant Bike   Level 1    Minutes 15    METs 2.51      NuStep   Level 3    Minutes 30    METs 2.3      T5 Nustep   Level 1    Minutes 15      Biostep-RELP   Level 1    Minutes 15    METs 2      Home Exercise Plan   Plans to continue exercise at Home (comment)   walking, recumbent bike, bands, weights   Frequency Add 2 additional days to program exercise sessions.    Initial Home Exercises Provided 04/05/22      Oxygen   Maintain Oxygen Saturation 88% or higher             Nutrition:  Target Goals: Understanding of nutrition guidelines, daily intake of sodium <1530m, cholesterol <209m calories 30% from fat and 7% or less from saturated fats, daily to have 5 or more servings of fruits and vegetables.  Education: All About Nutrition: -Group instruction provided by verbal, written material, interactive activities, discussions, models, and posters to present general guidelines for heart healthy nutrition including fat, fiber, MyPlate, the role of sodium in heart healthy nutrition, utilization of the nutrition label, and utilization of this knowledge for meal planning. Follow up email sent as well. Written material given at graduation. [facility]  Date 05/03/22  [facility]- Verbalizes Understanding       Biometrics:  Pre Biometrics - 02/26/22 1202       Pre Biometrics   Height 5' 3.5" (1.613 m)    Weight 186 lb 8 oz (84.6 kg)     Waist Circumference 34.5 inches    Hip Circumference 35.5 inches    Waist to Hip Ratio 0.97 %    BMI (Calculated) 32.51    Single Leg Stand 0 seconds              Nutrition Therapy Plan and Nutrition Goals:  Nutrition Therapy & Goals - 03/26/22 0904       Nutrition Therapy   Diet Heart healthy, low Na    Drug/Food Interactions Statins/Certain Fruits    Protein (specify units) 100-105g    Whole Grain Foods 2 servings   Her food intake is limited due to appetite   Fruits and Vegetables 5 servings/day   Her food intake is  limited due to appetite   Sodium 2 grams      Personal Nutrition Goals   Nutrition Goal ST: consider adding in a nutritional supplement, practice mechanical eating, eat protein foods first, aim to include fiber, fat, and protein at most meals. LT: Meet energy/protein needs    Comments 62 y.o. F admitted to pulmonary rehab for stage 3 severe COPD by GOLD classification. PMHx breast cancer s/p chemotherapy and radiation as of 2022, HTN, atherosclerosis, T2DM. Relevant medications includes farxiga, metformin, zofran, nucynta, prochlorperazine, crestor, zoloft, cymbalta, tirzepatide, cyclobenzaprine, hydrochlorothiazide, oxycodone, senokot, lyrica, senokot, fluconazole, anastrozole. She reports limiting/avoiding carbohydrates as she is nervous they will turn to fat; discussed how carbohydrates packaged with fiber like fruit, starchy vegetables, and whole grains can contribute energy rich carbohydrates, fiber, as well as vitamins and minerals. She is interested in including these foods again. Since her chemotherapy she has not had any taste, but some of it is coming back such as sweet and salty flavors - she feels almost too well; a hard candy was too sweet for her and she had to spit it out. She reports that her appetite has been lower overall since the chemotherapy: good day she will have two meals and a bad day she may only have one meal like 1/2 cup of cabbage. B: egg white  with whole grain toast or egg white with cheese D: Her husband cooks for her. She reports that instead of spaghetti she will use spaghetti squash, taco salad with ground [place], fish on the grill. She reports that she has lost some weight recently, she has been trying to lose; discussed how meeting calorie and protein needs is important. Discussed mechanical eating, how to include more calories with lower volume, eat protein rich foods first, and consider nutritional supplements. Discussed heart healthy eating, pulmonary MNT, as well as T2DM MNT.      Intervention Plan   Intervention Prescribe, educate and counsel regarding individualized specific dietary modifications aiming towards targeted core components such as weight, hypertension, lipid management, diabetes, heart failure and other comorbidities.    Expected Outcomes Short Term Goal: Understand basic principles of dietary content, such as calories, fat, sodium, cholesterol and nutrients.;Short Term Goal: A plan has been developed with personal nutrition goals set during dietitian appointment.;Long Term Goal: Adherence to prescribed nutrition plan.             Nutrition Assessments:  MEDIFICTS Score Key: ?70 Need to make dietary changes  40-70 Heart Healthy Diet ? 40 Therapeutic Level Cholesterol Diet  Flowsheet Row Pulmonary Rehab from 02/26/2022 in [facility] Cardiac and Pulmonary Rehab  Picture Your Plate Total Score on Admission 75      Picture Your Plate Scores: <78 Unhealthy dietary pattern with much room for improvement. 41-50 Dietary pattern unlikely to meet recommendations for good health and room for improvement. 51-60 More healthful dietary pattern, with some room for improvement.  >60 Healthy dietary pattern, although there may be some specific behaviors that could be improved.   Nutrition Goals Re-Evaluation:  Nutrition Goals Re-Evaluation     Shelby Name 05/01/22 6767             Goals   Nutrition Goal ST: consider  adding in a nutritional supplement, practice mechanical eating, eat protein foods first, aim to include fiber, fat, and protein at most meals. LT: Meet energy/protein needs       Comment Zoiee is practicing mechanical eating as she knows she needs to eat, but still not feeling hungry.  She is still working on getting in more protein.  Her husband is trying to stay on top of her about it and makes sure she is eating daily.       Expected Outcome SHort; Continue mechanical eating until full Long: Continue to get in more protein                Nutrition Goals Discharge (Final Nutrition Goals Re-Evaluation):  Nutrition Goals Re-Evaluation - 05/01/22 0752       Goals   Nutrition Goal ST: consider adding in a nutritional supplement, practice mechanical eating, eat protein foods first, aim to include fiber, fat, and protein at most meals. LT: Meet energy/protein needs    Comment Jayna is practicing mechanical eating as she knows she needs to eat, but still not feeling hungry.  She is still working on getting in more protein.  Her husband is trying to stay on top of her about it and makes sure she is eating daily.    Expected Outcome SHort; Continue mechanical eating until full Long: Continue to get in more protein             Psychosocial: Target Goals: Acknowledge presence or absence of significant depression and/or stress, maximize coping skills, provide positive support system. Participant is able to verbalize types and ability to use techniques and skills needed for reducing stress and depression.   Education: Stress, Anxiety, and Depression - Group verbal and visual presentation to define topics covered.  Reviews how body is impacted by stress, anxiety, and depression.  Also discusses healthy ways to reduce stress and to treat/manage anxiety and depression.  Written material given at graduation. Flowsheet Row Pulmonary Rehab from 05/03/2022 in [facility] Cardiac and Pulmonary Rehab  Date  04/11/22  Educator [facility]  Instruction Review Code 1- [employer] Understanding       Education: Sleep Hygiene -Provides group verbal and written instruction about how sleep can affect your health.  Define sleep hygiene, discuss sleep cycles and impact of sleep habits. Review good sleep hygiene tips.    Initial Review & Psychosocial Screening:  Initial Psych Review & Screening - 02/26/22 0748       Initial Review   Current issues with Current Depression;Current Psychotropic Meds;History of Depression;Current Stress Concerns    Source of Stress Concerns Chronic Illness;Unable to participate in former interests or hobbies;Unable to perform yard/household activities    Comments currently on sertaline, good days and bad days mostly well managed, getting eyes fixed to be able to see again      Talent? Yes   Husband, kids (lives near by)   Comments 4 kids one calls daily , other three drop in      Barriers   Psychosocial barriers to participate in program The patient should benefit from training in stress management and relaxation.;Psychosocial barriers identified (see note)      Screening Interventions   Interventions Encouraged to exercise;To provide support and resources with identified psychosocial needs;Provide feedback about the scores to participant    Expected Outcomes Short Term goal: Utilizing psychosocial counselor, staff and physician to assist with identification of specific Stressors or current issues interfering with healing process. Setting desired goal for each stressor or current issue identified.;Long Term Goal: Stressors or current issues are controlled or eliminated.;Short Term goal: Identification and review with participant of any Quality of Life or Depression concerns found by scoring the questionnaire.;Long Term goal: The participant improves quality of Life and PHQ9 Scores  as seen by post scores and/or verbalization of changes              Quality of Life Scores:  Scores of 19 and below usually indicate a poorer quality of life in these areas.  A difference of  2-3 points is a clinically meaningful difference.  A difference of 2-3 points in the total score of the Quality of Life Index has been associated with significant improvement in overall quality of life, self-image, physical symptoms, and general health in studies assessing change in quality of life.  PHQ-9: Review Flowsheet       05/01/2022 02/26/2022  Depression screen PHQ 2/9  Decreased Interest 1 2  Down, Depressed, Hopeless 1 1  PHQ - 2 Score 2 3  Altered sleeping 0 3  Tired, decreased energy 3 3  Change in appetite 1 2  Feeling bad or failure about yourself  1 1  Trouble concentrating 1 3  Moving slowly or fidgety/restless 1 2  Suicidal thoughts 0 0  PHQ-9 Score 9 17  Difficult doing work/chores Somewhat difficult Somewhat difficult   Interpretation of Total Score  Total Score Depression Severity:  1-4 = Minimal depression, 5-9 = Mild depression, 10-14 = Moderate depression, 15-19 = Moderately severe depression, 20-27 = Severe depression   Psychosocial Evaluation and Intervention:   Psychosocial Re-Evaluation:  Psychosocial Re-Evaluation     Row Name 04/05/22 0811 05/01/22 0739           Psychosocial Re-Evaluation   Current issues with Current Depression;Current Anxiety/Panic;Current Psychotropic Meds;Current Sleep Concerns;Current Stress Concerns Current Depression;Current Anxiety/Panic;Current Psychotropic Meds;Current Sleep Concerns;Current Stress Concerns      Comments Elenora is doing well in rehab.  She has been taking her meds still and feeling pretty good.  She does not feel perfectly balanced yet, but she is working with her doctor.  She continues to struggle with her sleep as she only gets about 3-4 hrs a night and then wakes up and unable to go to sleep.  She has done melatonin before and has tried 18m and 7.5 mg but not ten yet, so she  will try.  She conitnues to work on it, we talked about importance of sleep too. DJacqelinehas been doing well up until a fall last week.  Her PHQ has greatly improved from 17 down to 9.  She is feeling better up until her fall.  She is sleeping better and trying to find the good.  She just get frustrated that she is unable to do everything she wants to.  She wants to continue to build up her strength and stamina.  She is feeling like her mood is balanced      Expected Outcomes -- Short: Get back to routine exercise again Long: Continue to exercise to build stamina.      Interventions Stress management education;Encouraged to attend Pulmonary Rehabilitation for the exercise Stress management education;Encouraged to attend Pulmonary Rehabilitation for the exercise      Continue Psychosocial Services  Follow up required by staff Follow up required by staff               Psychosocial Discharge (Final Psychosocial Re-Evaluation):  Psychosocial Re-Evaluation - 05/01/22 0739       Psychosocial Re-Evaluation   Current issues with Current Depression;Current Anxiety/Panic;Current Psychotropic Meds;Current Sleep Concerns;Current Stress Concerns    Comments DJosalynnhas been doing well up until a fall last week.  Her PHQ has greatly improved from 17 down to 9.  She  is feeling better up until her fall.  She is sleeping better and trying to find the good.  She just get frustrated that she is unable to do everything she wants to.  She wants to continue to build up her strength and stamina.  She is feeling like her mood is balanced    Expected Outcomes Short: Get back to routine exercise again Long: Continue to exercise to build stamina.    Interventions Stress management education;Encouraged to attend Pulmonary Rehabilitation for the exercise    Continue Psychosocial Services  Follow up required by staff             Education: Education Goals: Education classes will be provided on a weekly basis, covering  required topics. Participant will state understanding/return demonstration of topics presented.  Learning Barriers/Preferences:  Learning Barriers/Preferences - 02/26/22 0747       Learning Barriers/Preferences   Learning Barriers Sight   eye lids drooping   Learning Preferences Skilled Demonstration;Verbal Instruction             General Pulmonary Education Topics:  Infection Prevention: - Provides verbal and written material to individual with discussion of infection control including proper hand washing and proper equipment cleaning during exercise session. Flowsheet Row Pulmonary Rehab from 05/03/2022 in [facility] Cardiac and Pulmonary Rehab  Date 02/26/22  Educator [facility]  Instruction Review Code 1- Verbalizes Understanding       Falls Prevention: - Provides verbal and written material to individual with discussion of falls prevention and safety. Flowsheet Row Pulmonary Rehab from 05/03/2022 in [facility] Cardiac and Pulmonary Rehab  Date 02/26/22  Educator [facility]  Instruction Review Code 1- Verbalizes Understanding       Chronic Lung Disease Review: - Group verbal instruction with posters, models, PowerPoint presentations and videos,  to review new updates, new respiratory medications, new advancements in procedures and treatments. Providing information on websites and "800" numbers for continued self-education. Includes information about supplement oxygen, available portable oxygen systems, continuous and intermittent flow rates, oxygen safety, concentrators, and Medicare reimbursement for oxygen. Explanation of Pulmonary Drugs, including class, frequency, complications, importance of spacers, rinsing mouth after steroid MDI's, and proper cleaning methods for nebulizers. Review of basic lung anatomy and physiology related to function, structure, and complications of lung disease. Review of risk factors. Discussion about methods for diagnosing sleep apnea and types of masks and machines for  OSA. Includes a review of the use of types of environmental controls: home humidity, furnaces, filters, dust mite/pet prevention, HEPA vacuums. Discussion about weather changes, air quality and the benefits of nasal washing. Instruction on Warning signs, infection symptoms, calling MD promptly, preventive modes, and value of vaccinations. Review of effective airway clearance, coughing and/or vibration techniques. Emphasizing that all should Create an Action Plan. Written material given at graduation. Flowsheet Row Pulmonary Rehab from 05/03/2022 in [facility] Cardiac and Pulmonary Rehab  Education need identified 02/26/22  Date 04/05/22  Educator [facility]  Instruction Review Code 1- Verbalizes Understanding       AED/CPR: - Group verbal and written instruction with the use of models to demonstrate the basic use of the AED with the basic ABC's of resuscitation.    Anatomy and Cardiac Procedures: - Group verbal and visual presentation and models provide information about basic cardiac anatomy and function. Reviews the testing methods done to diagnose heart disease and the outcomes of the test results. Describes the treatment choices: Medical Management, Angioplasty, or Coronary Bypass Surgery for treating various heart conditions including Myocardial Infarction, Angina,  Valve Disease, and Cardiac Arrhythmias.  Written material given at graduation.   Medication Safety: - Group verbal and visual instruction to review commonly prescribed medications for heart and lung disease. Reviews the medication, class of the drug, and side effects. Includes the steps to properly store meds and maintain the prescription regimen.  Written material given at graduation. Flowsheet Row Pulmonary Rehab from 05/03/2022 in [facility] Cardiac and Pulmonary Rehab  Date 03/22/22  Educator SB  Instruction Review Code 1- Verbalizes Understanding       Other: -Provides group and verbal instruction on various topics (see  comments)   Knowledge Questionnaire Score:  Knowledge Questionnaire Score - 02/26/22 1204       Knowledge Questionnaire Score   Pre Score 12/18              Core Components/Risk Factors/Patient Goals at Admission:  Personal Goals and Risk Factors at Admission - 02/26/22 0751       Core Components/Risk Factors/Patient Goals on Admission    Weight Management Yes;Weight Loss;Obesity    Intervention Weight Management: Develop a combined nutrition and exercise program designed to reach desired caloric intake, while maintaining appropriate intake of nutrient and fiber, sodium and fats, and appropriate energy expenditure required for the weight goal.;Weight Management: Provide education and appropriate resources to help participant work on and attain dietary goals.;Weight Management/Obesity: Establish reasonable short term and long term weight goals.;Obesity: Provide education and appropriate resources to help participant work on and attain dietary goals.    Admit Weight 186 lb 8 oz (84.6 kg)    Goal Weight: Short Term 182 lb (82.6 kg)    Goal Weight: Long Term 180 lb (81.6 kg)    Expected Outcomes Short Term: Continue to assess and modify interventions until short term weight is achieved;Long Term: Adherence to nutrition and physical activity/exercise program aimed toward attainment of established weight goal;Weight Loss: Understanding of general recommendations for a balanced deficit meal plan, which promotes 1-2 lb weight loss per week and includes a negative energy balance of [phone] kcal/d;Understanding recommendations for meals to include 15-35% energy as protein, 25-35% energy from fat, 35-60% energy from carbohydrates, less than 227m of dietary cholesterol, 20-35 gm of total fiber daily;Understanding of distribution of calorie intake throughout the day with the consumption of 4-5 meals/snacks    Improve shortness of breath with ADL's Yes    Intervention Provide education,  individualized exercise plan and daily activity instruction to help decrease symptoms of SOB with activities of daily living.    Expected Outcomes Short Term: Improve cardiorespiratory fitness to achieve a reduction of symptoms when performing ADLs;Long Term: Be able to perform more ADLs without symptoms or delay the onset of symptoms    Increase knowledge of respiratory medications and ability to use respiratory devices properly  Yes    Intervention Provide education and demonstration as needed of appropriate use of medications, inhalers, and oxygen therapy.    Expected Outcomes Short Term: Achieves understanding of medications use. Understands that oxygen is a medication prescribed by physician. Demonstrates appropriate use of inhaler and oxygen therapy.;Long Term: Maintain appropriate use of medications, inhalers, and oxygen therapy.    Diabetes Yes    Intervention Provide education about signs/symptoms and action to take for hypo/hyperglycemia.;Provide education about proper nutrition, including hydration, and aerobic/resistive exercise prescription along with prescribed medications to achieve blood glucose in normal ranges: Fasting glucose 65-99 mg/dL    Expected Outcomes Short Term: Participant verbalizes understanding of the signs/symptoms and immediate care of hyper/hypoglycemia, proper  foot care and importance of medication, aerobic/resistive exercise and nutrition plan for blood glucose control.;Long Term: Attainment of HbA1C < 7%.    Hypertension Yes    Intervention Provide education on lifestyle modifcations including regular physical activity/exercise, weight management, moderate sodium restriction and increased consumption of fresh fruit, vegetables, and low fat dairy, alcohol moderation, and smoking cessation.;Monitor prescription use compliance.    Expected Outcomes Short Term: Continued assessment and intervention until BP is < 140/73m HG in hypertensive participants. < 130/827mHG in  hypertensive participants with diabetes, heart failure or chronic kidney disease.;Long Term: Maintenance of blood pressure at goal levels.    Lipids Yes    Intervention Provide education and support for participant on nutrition & aerobic/resistive exercise along with prescribed medications to achieve LDL <7064mHDL >33m76m  Expected Outcomes Short Term: Participant states understanding of desired cholesterol values and is compliant with medications prescribed. Participant is following exercise prescription and nutrition guidelines.;Long Term: Cholesterol controlled with medications as prescribed, with individualized exercise RX and with personalized nutrition plan. Value goals: LDL < 70mg54mL > 40 mg.             Education:Diabetes - Individual verbal and written instruction to review signs/symptoms of diabetes, desired ranges of glucose level fasting, after meals and with exercise. Acknowledge that pre and post exercise glucose checks will be done for 3 sessions at entry of program. Flowsheet Row [facility]  Date 02/26/22  Educator JH  [facility] Review Code 1- Verbalizes Understanding       Know Your Numbers and Heart Failure: - Group verbal and visual instruction to discuss disease risk factors for cardiac and pulmonary disease and treatment options.  Reviews associated critical values for Overweight/Obesity, Hypertension, Cholesterol, and Diabetes.  Discusses basics of heart failure: signs/symptoms and treatments.  Introduces Heart Failure Zone chart for action plan for heart failure.  Written material given at graduation. Flowsheet [facility]  Date 03/29/22  Educator SB  Instruction Review Code 1- Verbalizes Understanding       Core Components/Risk Factors/Patient Goals Review:   Goals and Risk Factor Review     Row Name 04/05/22 0815 05/01/22 0754            Core Components/Risk Factors/Patient Goals Review   Personal Goals Review Weight Management/Obesity;Improve shortness of breath with ADL's;Increase knowledge of respiratory medications and ability to use respiratory devices properly.;Diabetes;Hypertension;Lipids Weight Management/Obesity;Improve shortness of breath with ADL's;Increase knowledge of respiratory medications and ability to use respiratory devices properly.;Diabetes;Hypertension;Lipids      Review DebraMayelinoing well in rehab.  Her weight has been trending down.  Her breathing is getting better for most part but still has harder days.  She is doing well with her inhalers and nebulizers.  Her sugars are doing well around 120s in the morning.  Her pressures have been staying good.  She continues to check both at home. DebraRoiseoing well in rehab. She missed some time with medical appointments and a fall last week.  She is doing better with her breahing and practices it at home.  She is keeping her weight steady and watching her pressures and sugars at home with her husbands help.  She is good about using her nebulizer and inhalers.      Expected Outcomes Short: Conintue to work on breathing Long: Conitnue to monitor risk factors Short: Conitnue to work on breathing  Long: Conitnue to monitor weight to not lose               Core Components/Risk Factors/Patient Goals at Discharge (Final Review):   Goals and Risk Factor Review - 05/01/22 0754       Core Components/Risk Factors/Patient Goals Review   Personal Goals Review Weight Management/Obesity;Improve shortness of breath with ADL's;Increase knowledge of respiratory medications and ability to use respiratory devices properly.;Diabetes;Hypertension;Lipids    Review Elenna is doing well in rehab. She missed some time with medical appointments and a fall last week.  She is doing better with her breahing and practices it at home.  She is keeping her weight steady and watching her  pressures and sugars at home with her husbands help.  She is good about using her nebulizer and inhalers.    Expected Outcomes Short: Conitnue to work on breathing Long: Conitnue to monitor weight to not lose             ITP Comments:  ITP Comments     Row Name 02/26/22 1155 03/21/22 0723 03/26/22 1439 03/27/22 1545 04/18/22 0935   ITP Comments Completed 6MWT and gym orientation. Initial ITP created and sent for review to Dr. Zetta Bills, Medical Director.  Documentation for diagnosis can be found in [facility] encounter 01/12/22. 30 Day review completed. Medical Director ITP review done, changes made as directed, and signed approval by Medical Director.    New to program Completed initial RD consultation Staff felt that patient would better benefit from going to Physical Therapy first prior to finishing rehab.  She would do better in rehab if she could build up her strength and balance in physical therapy.  Note sent to MD to request PT referral. We will continue with rehab until set up.  Staff spoke with Troi and her husband and they both agreed and voiced understanding. 30 Day review completed. Medical Director ITP review done, changes made as directed, and signed approval by Medical Director.    Aura has continued PR and is showing improvement with independence of movement    Row Name 05/01/22 0736 05/16/22 0808         ITP Comments Sarajane had a fall off the toliet last week which kept her out for an extra day. 30 Day review completed. Medical Director ITP review done, changes made as directed, and signed approval by Medical Director.               Comments:

## 2022-05-17 ENCOUNTER — Encounter: Payer: HMO | Admitting: *Deleted

## 2022-05-17 DIAGNOSIS — J449 Chronic obstructive pulmonary disease, unspecified: Secondary | ICD-10-CM | POA: Diagnosis not present

## 2022-05-17 NOTE — Progress Notes (Signed)
Daily Session Note  Patient Details  Name: Ruth White MRN: [id number] Date of Birth: [birth year] Referring Provider:   Flowsheet Row Pulmonary Rehab from 02/26/2022 in [facility] Cardiac and Pulmonary Rehab  Referring Provider Margaretha Seeds MD       Encounter Date: 05/17/2022  Check In:  Session Check In - 05/17/22 0758       Check-In   Supervising physician immediately available to respond to emergencies See telemetry face sheet for immediately available ER MD    Location [facility]-Cardiac & Pulmonary Rehab    Staff Present Alberteen Sam, MA, RCEP, CCRP, CCET;Joseph Bath, [place];Heath Lark, RN, BSN, CCRP;Other   Andy Gauss RN   Virtual Visit No    Medication changes reported     No    Fall or balance concerns reported    No    Warm-up and Cool-down Performed on first and last piece of equipment    Resistance Training Performed Yes    VAD Patient? No    PAD/SET Patient? No      Pain Assessment   Currently in Pain? No/denies                Social History   Tobacco Use  Smoking Status Never  Smokeless Tobacco Never    Goals Met:  Proper associated with RPD/PD & O2 Sat Independence with exercise equipment Exercise tolerated well No report of concerns or symptoms today Strength training completed today  Goals Unmet:  Not Applicable  Comments: Pt able to follow exercise prescription today without complaint.  Will continue to monitor for progression.    Dr. Emily Filbert is Medical Director for [place].  Dr. Ottie Glazier is Medical Director for [facility] Pulmonary Rehabilitation.

## 2022-05-21 ENCOUNTER — Ambulatory Visit: Payer: HMO | Admitting: *Deleted

## 2022-05-22 DIAGNOSIS — I10 Essential (primary) hypertension: Secondary | ICD-10-CM | POA: Diagnosis not present

## 2022-05-22 DIAGNOSIS — E1169 Type 2 diabetes mellitus with other specified complication: Secondary | ICD-10-CM | POA: Diagnosis not present

## 2022-05-23 ENCOUNTER — Other Ambulatory Visit: Payer: Self-pay | Admitting: Surgery

## 2022-05-23 DIAGNOSIS — C50012 Malignant neoplasm of nipple and areola, left female breast: Secondary | ICD-10-CM

## 2022-05-24 ENCOUNTER — Encounter: Payer: HMO | Attending: Pulmonary Disease | Admitting: *Deleted

## 2022-05-24 DIAGNOSIS — J449 Chronic obstructive pulmonary disease, unspecified: Secondary | ICD-10-CM | POA: Diagnosis not present

## 2022-05-24 NOTE — Progress Notes (Signed)
Daily Session Note  Patient Details  Name: Ruth White MRN: [id number] Date of Birth: September 05, 1960 Referring Provider:   Flowsheet Row Pulmonary Rehab from 02/26/2022 in [facility] Cardiac and Pulmonary Rehab  Referring Provider Margaretha Seeds MD       Encounter Date: 05/24/2022  Check In:  Session Check In - 05/24/22 0808       Check-In   Supervising physician immediately available to respond to emergencies See telemetry face sheet for immediately available ER MD    Location [facility]-Cardiac & Pulmonary Rehab    Staff Present Heath Lark, RN, BSN, CCRP;Jessica Addison, MA, RCEP, CCRP, CCET;Joseph [place], [place]    Virtual Visit No    Medication changes reported     No    Fall or balance concerns reported    No    Warm-up and Cool-down Performed on first and last piece of equipment    Resistance Training Performed Yes    VAD Patient? No    PAD/SET Patient? No      Pain Assessment   Currently in Pain? No/denies                Social History   Tobacco Use  Smoking Status Never  Smokeless Tobacco Never    Goals Met:  Proper associated with RPD/PD & O2 Sat Independence with exercise equipment Exercise tolerated well No report of concerns or symptoms today  Goals Unmet:  Not Applicable  Comments: Pt able to follow exercise prescription today without complaint.  Will continue to monitor for progression.    Dr. Emily Filbert is Medical Director for [place].  Dr. Ottie Glazier is Medical Director for [facility] Pulmonary Rehabilitation.

## 2022-05-29 DIAGNOSIS — Z6831 Body mass index (BMI) 31.0-31.9, adult: Secondary | ICD-10-CM | POA: Diagnosis not present

## 2022-05-29 DIAGNOSIS — E782 Mixed hyperlipidemia: Secondary | ICD-10-CM | POA: Diagnosis not present

## 2022-05-29 DIAGNOSIS — Z139 Encounter for screening, unspecified: Secondary | ICD-10-CM | POA: Diagnosis not present

## 2022-05-29 DIAGNOSIS — Z1331 Encounter for screening for depression: Secondary | ICD-10-CM | POA: Diagnosis not present

## 2022-05-29 DIAGNOSIS — E1169 Type 2 diabetes mellitus with other specified complication: Secondary | ICD-10-CM | POA: Diagnosis not present

## 2022-05-30 ENCOUNTER — Other Ambulatory Visit: Payer: Self-pay | Admitting: Hematology and Oncology

## 2022-05-30 DIAGNOSIS — K5900 Constipation, unspecified: Secondary | ICD-10-CM

## 2022-05-30 DIAGNOSIS — Z1389 Encounter for screening for other disorder: Secondary | ICD-10-CM | POA: Diagnosis not present

## 2022-05-30 DIAGNOSIS — K5903 Drug induced constipation: Secondary | ICD-10-CM

## 2022-05-30 DIAGNOSIS — Z79899 Other long term (current) drug therapy: Secondary | ICD-10-CM | POA: Diagnosis not present

## 2022-05-30 DIAGNOSIS — G629 Polyneuropathy, unspecified: Secondary | ICD-10-CM | POA: Diagnosis not present

## 2022-05-30 DIAGNOSIS — G894 Chronic pain syndrome: Secondary | ICD-10-CM | POA: Diagnosis not present

## 2022-05-31 ENCOUNTER — Encounter: Payer: HMO | Admitting: *Deleted

## 2022-05-31 DIAGNOSIS — J449 Chronic obstructive pulmonary disease, unspecified: Secondary | ICD-10-CM

## 2022-05-31 NOTE — Progress Notes (Signed)
Daily Session Note  Patient Details  Name: Ruth White MRN: [id number] Date of Birth: [birth year] Referring Provider:   Flowsheet Row Pulmonary Rehab from 02/26/2022 in [facility] Cardiac and Pulmonary Rehab  Referring Provider Margaretha Seeds MD       Encounter Date: 05/31/2022  Check In:  Session Check In - 05/31/22 0752       Check-In   Supervising physician immediately available to respond to emergencies See telemetry face sheet for immediately available ER MD    Location [facility]-Cardiac & Pulmonary Rehab    Staff Present Justin Mend, Lorre Nick, MA, RCEP, CCRP, Mindi Curling, RN, [place]    Virtual Visit No    Medication changes reported     No    Fall or balance concerns reported    No    Warm-up and Cool-down Performed on first and last piece of equipment    Resistance Training Performed Yes    VAD Patient? No    PAD/SET Patient? No      Pain Assessment   Currently in Pain? No/denies                Social History   Tobacco Use  Smoking Status Never  Smokeless Tobacco Never    Goals Met:  Independence with exercise equipment Exercise tolerated well No report of concerns or symptoms today Strength training completed today  Goals Unmet:  Not Applicable  Comments: Pt able to follow exercise prescription today without complaint.  Will continue to monitor for progression.    Dr. Emily Filbert is Medical Director for [place].  Dr. Ottie Glazier is Medical Director for [facility] Pulmonary Rehabilitation.

## 2022-06-06 DIAGNOSIS — F331 Major depressive disorder, recurrent, moderate: Secondary | ICD-10-CM | POA: Diagnosis not present

## 2022-06-06 DIAGNOSIS — Z139 Encounter for screening, unspecified: Secondary | ICD-10-CM | POA: Diagnosis not present

## 2022-06-06 DIAGNOSIS — Z6830 Body mass index (BMI) 30.0-30.9, adult: Secondary | ICD-10-CM | POA: Diagnosis not present

## 2022-06-06 DIAGNOSIS — N898 Other specified noninflammatory disorders of vagina: Secondary | ICD-10-CM | POA: Diagnosis not present

## 2022-06-13 ENCOUNTER — Encounter: Payer: Self-pay | Admitting: *Deleted

## 2022-06-13 DIAGNOSIS — J449 Chronic obstructive pulmonary disease, unspecified: Secondary | ICD-10-CM

## 2022-06-13 NOTE — Progress Notes (Signed)
Pulmonary Individual Treatment Plan  Patient Details  Name: Ruth White MRN: [id number] Date of Birth: 04/29/1961 Referring Provider:   Flowsheet Row Pulmonary Rehab from 02/26/2022 in [facility]  Referring Provider Ellison, Chi Jane MD       Initial Encounter Date:  Flowsheet Row Pulmonary Rehab from 02/26/2022 in [facility]  Date 02/26/22       Visit Diagnosis: Stage 3 severe COPD by GOLD classification (HCC)  Patient's Home Medications on Admission:  Current Outpatient Medications:    ACCU-CHEK GUIDE test strip, 1 (ONE) EACH DAILY AND AS NEEDED FOR SYMPTOMS, Disp: , Rfl:    Accu-Chek Softclix Lancets lancets, USE 1 LANCET DAILY AS DIRECTED, Disp: , Rfl:    anastrozole (ARIMIDEX) 1 MG tablet, TAKE 1 TABLET BY MOUTH EVERY DAY TO PREVENT FUTURE DISEASE RECURRENCE, Disp: 90 tablet, Rfl: 3   aspirin 325 MG EC tablet, Take 325 mg by mouth daily., Disp: , Rfl:    atenolol (TENORMIN) 50 MG tablet, Take 50 mg by mouth daily., Disp: , Rfl:    BD PEN NEEDLE NANO 2ND GEN 32G X 4 MM MISC, 1 (ONE) PEN NEEDLE USE DAILY, Disp: , Rfl:    Blood Glucose Monitoring Suppl (ACCU-CHEK GUIDE ME) w/Device KIT, 1 (ONE) KIT CHECK BLOOD GLUCOSE DAILY, Disp: , Rfl:    BREZTRI AEROSPHERE 160-9-4.8 MCG/ACT AERO, Inhale 2 puffs into the lungs in the morning and at bedtime., Disp: 10.7 g, Rfl: 5   cyclobenzaprine (FLEXERIL) 10 MG tablet, Take 1 tablet by mouth 2 (two) times daily as needed. (Patient not taking: Reported on 01/24/2022), Disp: , Rfl:    diclofenac Sodium (VOLTAREN) 1 % GEL, Apply topically., Disp: , Rfl:    DULoxetine (CYMBALTA) 30 MG capsule, Take 30 mg by mouth daily. (Patient not taking: Reported on 01/24/2022), Disp: , Rfl:    Evolocumab (REPATHA SURECLICK) 140 MG/ML SOAJ, Inject 1 mL into the skin every 14 (fourteen) days., Disp: 2 mL, Rfl: 11   ezetimibe (ZETIA) 10 MG tablet, Take 10 mg by mouth at bedtime., Disp: , Rfl:    FARXIGA 10 MG TABS  tablet, Take 10 mg by mouth every morning., Disp: , Rfl:    glucose blood (ACCU-CHEK GUIDE) test strip, 1 (ONE) EACH DAILY AND AS NEEDED FOR SYMPTOMS, Disp: , Rfl:    hydrochlorothiazide (HYDRODIURIL) 12.5 MG tablet, Take by mouth. (Patient not taking: Reported on 01/24/2022), Disp: , Rfl:    ibuprofen (ADVIL) 800 MG tablet, Take 800 mg by mouth every 8 (eight) hours as needed., Disp: , Rfl:    lactulose, encephalopathy, (CHRONULAC) 10 GM/15ML SOLN, Take 15 mLs (10 g total) by mouth daily., Disp: 473 mL, Rfl: 2   lisinopril (ZESTRIL) 20 MG tablet, Take 20 mg by mouth 2 (two) times daily., Disp: , Rfl:    magic mouthwash SOLN, Take 5 mLs by mouth., Disp: , Rfl:    metFORMIN (GLUCOPHAGE-XR) 500 MG 24 hr tablet, Take 500 mg by mouth daily., Disp: , Rfl:    naloxone (NARCAN) nasal spray 4 mg/0.1 mL, Use as directed for overdose of narcotic medication, Disp: 1 each, Rfl: 2   NUCYNTA 50 MG tablet, Take 50 mg by mouth 4 (four) times daily., Disp: , Rfl:    ondansetron (ZOFRAN) 4 MG tablet, Take 4 mg by mouth every 8 (eight) hours as needed for nausea or vomiting., Disp: , Rfl:    Oxycodone HCl 10 MG TABS, Take 0.5 tablets (5 mg total) by mouth   every 4 (four) hours as needed., Disp: 120 tablet, Rfl: 0   pregabalin (LYRICA) 75 MG capsule, TAKE 1 CAPSULE BY MOUTH TWICE A DAY, Disp: 180 capsule, Rfl: 0   prochlorperazine (COMPAZINE) 10 MG tablet, Take 1 tablet (10 mg total) by mouth every 6 (six) hours as needed for nausea or vomiting., Disp: 30 tablet, Rfl: 1   rosuvastatin (CRESTOR) 40 MG tablet, Take 40 mg by mouth daily., Disp: , Rfl:    senna (SENOKOT) 8.6 MG tablet, Take 1 tablet by mouth daily. Take 1-2 tablets (Patient not taking: Reported on 01/24/2022), Disp: , Rfl:    sertraline (ZOLOFT) 50 MG tablet, Take 1 tablet by mouth daily., Disp: , Rfl:    tirzepatide (MOUNJARO) 2.5 MG/0.5ML Pen, Inject one pen once a week.  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, left ([place])    Personal history of chemotherapy    Personal history of radiation therapy     Tobacco Use: Social History   Tobacco Use  Smoking Status Never  Smokeless Tobacco Never    Labs: Review Flowsheet        No data to display           Pulmonary Assessment Scores:  Pulmonary Assessment Scores     Row Name 02/26/22 1205         ADL UCSD   ADL Phase Entry     SOB Score total 106     Rest 1     Walk 4     Stairs 5     Bath 5     Dress 5     Shop 5       CAT Score   CAT Score 26       mMRC Score   mMRC Score 4              UCSD: Self-administered rating of dyspnea associated with activities of daily living (ADLs) 6-point scale (0 = "not at all" to 5 = "maximal or unable to do because of breathlessness")  Scoring Scores range from 0 to 120.  Minimally important difference is 5 units  CAT: CAT can identify the health impairment of COPD patients and is better correlated with disease progression.  CAT has a scoring range of zero to 40. The CAT score is classified into four groups of low (less than 10), medium (10 - 20), high (21-30) and very high (31-40) based on the impact level of disease on health status. A CAT score over 10 suggests significant symptoms.  A worsening CAT score could be explained by an exacerbation, poor medication adherence, poor inhaler technique, or progression of COPD or comorbid conditions.  CAT MCID is 2 points  mMRC: mMRC (Modified Medical Research Council) Dyspnea Scale is used to assess the degree of baseline functional disability in patients of respiratory disease due to dyspnea. No minimal important difference is  established. A decrease in score of 1 point or greater is considered a positive change.   Pulmonary Function Assessment:  Pulmonary Function Assessment - 02/26/22 1205       Breath  Shortness of Breath Yes;Panic with Shortness of Breath;Fear of Shortness of Breath;Limiting activity             Exercise Target Goals: Exercise Program Goal: Individual exercise prescription set using results from initial 6 min walk test and THRR while considering  patient's activity barriers and safety.   Exercise Prescription Goal: Initial exercise prescription builds to 30-45 minutes a day of aerobic activity, 2-3 days per week.  Home exercise guidelines will be given to patient during program as part of exercise prescription that the participant will acknowledge.  Education: Aerobic Exercise: - Group verbal and visual presentation on the components of exercise prescription. Introduces F.I.T.T principle from ACSM for exercise prescriptions.  Reviews F.I.T.T. principles of aerobic exercise including progression. Written material given at graduation.   Education: Resistance Exercise: - Group verbal and visual presentation on the components of exercise prescription. Introduces F.I.T.T principle from ACSM for exercise prescriptions  Reviews F.I.T.T. principles of resistance exercise including progression. Written material given at graduation. Flowsheet Row Pulmonary Rehab from 05/31/2022 in [facility]  Date 03/01/22  Educator NT  Instruction Review Code 1- Verbalizes Understanding        Education: Exercise & Equipment Safety: - Individual verbal instruction and demonstration of equipment use and safety with use of the equipment. Flowsheet Row Pulmonary Rehab from 05/31/2022 in [facility]  Date 02/26/22  Educator JH  Instruction Review Code 1- Verbalizes Understanding       Education: Exercise Physiology & General Exercise Guidelines: - Group  verbal and written instruction with models to review the exercise physiology of the cardiovascular system and associated critical values. Provides general exercise guidelines with specific guidelines to those with heart or lung disease.  Flowsheet Row Pulmonary Rehab from 05/31/2022 in [facility]  Date 04/19/22  Educator JH  Instruction Review Code 1- Verbalizes Understanding       Education: Flexibility, Balance, Mind/Body Relaxation: - Group verbal and visual presentation with interactive activity on the components of exercise prescription. Introduces F.I.T.T principle from ACSM for exercise prescriptions. Reviews F.I.T.T. principles of flexibility and balance exercise training including progression. Also discusses the mind body connection.  Reviews various relaxation techniques to help reduce and manage stress (i.e. Deep breathing, progressive muscle relaxation, and visualization). Balance handout provided to take home. Written material given at graduation. Flowsheet Row Pulmonary Rehab from 05/31/2022 in [facility]  Date 03/01/22  Educator NT  Instruction Review Code 1- Verbalizes Understanding       Activity Barriers & Risk Stratification:  Activity Barriers & Cardiac Risk Stratification - 02/26/22 0745       Activity Barriers & Cardiac Risk Stratification   Activity Barriers Arthritis;Muscular Weakness;Deconditioning;Balance Concerns;Shortness of Breath;History of Falls;Joint Problems;Other (comment);Neck/Spine Problems    Comments neuropathy in hands and feet, shots in both shoulders (hx shoulder fracture)             6 Minute Walk:  6 Minute Walk     Row Name 02/26/22 1157         6 Minute Walk   Phase Initial     Distance 500 feet     Walk Time 5.5 minutes     # of Rest Breaks 2  15 sec, 15 sec     MPH 1.03     METS 1.79     RPE 14     Perceived Dyspnea  2     VO2 Peak 6.26       Symptoms Yes (comment)     Comments  SOB, fatigue, Chronic knees (8/10), hips (8/10), and feet (9/10) pain     Resting HR 84 bpm     Resting BP 126/60     Resting Oxygen Saturation  94 %     Exercise Oxygen Saturation  during 6 min walk 95 %     Max Ex. HR 113 bpm     Max Ex. BP 124/70     2 Minute Post BP 124/68       Interval HR   1 Minute HR 105     2 Minute HR 106     3 Minute HR 109     4 Minute HR 109     5 Minute HR 105     6 Minute HR 113     2 Minute Post HR 108     Interval Heart Rate? Yes       Interval Oxygen   Interval Oxygen? Yes     Baseline Oxygen Saturation % 84 %  Room Air     1 Minute Oxygen Saturation % 95 %     1 Minute Liters of Oxygen 2 L  continuous     2 Minute Oxygen Saturation % 97 %     2 Minute Liters of Oxygen 2 L     3 Minute Oxygen Saturation % 96 %     3 Minute Liters of Oxygen 2 L     4 Minute Oxygen Saturation % 96 %     4 Minute Liters of Oxygen 2 L     5 Minute Oxygen Saturation % 95 %     5 Minute Liters of Oxygen 2 L     6 Minute Oxygen Saturation % 96 %     6 Minute Liters of Oxygen 2 L     2 Minute Post Oxygen Saturation % 96 %     2 Minute Post Liters of Oxygen 2 L             Oxygen Initial Assessment:  Oxygen Initial Assessment - 02/26/22 0753       Home Oxygen   Home Oxygen Device E-Tanks;Home Concentrator    Sleep Oxygen Prescription Continuous    Liters per minute 2    Home Exercise Oxygen Prescription Continuous    Liters per minute 2    Home Resting Oxygen Prescription None    Compliance with Home Oxygen Use Yes      Initial 6 min Walk   Oxygen Used Continuous;E-Tanks    Liters per minute 2      Program Oxygen Prescription   Program Oxygen Prescription Continuous;E-Tanks    Liters per minute 2      Intervention   Short Term Goals To learn and exhibit compliance with exercise, home and travel O2 prescription;To learn and understand importance of monitoring SPO2 with pulse oximeter and demonstrate accurate use of the pulse oximeter.;To learn  and understand importance of maintaining oxygen saturations>88%;To learn and demonstrate proper pursed lip breathing techniques or other breathing techniques. ;To learn and demonstrate proper use of respiratory medications    Long  Term Goals Exhibits compliance with exercise, home  and travel O2 prescription;Verbalizes importance of monitoring SPO2 with pulse oximeter and return demonstration;Compliance with respiratory medication;Maintenance of O2 saturations>88%;Exhibits proper breathing techniques, such as pursed lip breathing or other method taught during program session;Demonstrates proper use of MDI's               Oxygen Re-Evaluation:  Oxygen Re-Evaluation     Row Name 03/01/22 0932 04/05/22 0817 05/01/22 0756 05/31/22 0809       Program Oxygen Prescription   Program Oxygen Prescription Continuous;E-Tanks Continuous;E-Tanks Continuous;E-Tanks Continuous;E-Tanks    Liters per minute 2 -- 2 2      Home Oxygen   Home Oxygen Device E-Tanks;Home Concentrator E-Tanks;Home Concentrator E-Tanks;Home Concentrator E-Tanks;Home Concentrator    Sleep Oxygen Prescription Continuous Continuous Continuous Continuous    Liters per minute 2 2 2 2    Home Exercise Oxygen Prescription Continuous Continuous Continuous Continuous    Liters per minute 2 2 2 2    Home Resting Oxygen Prescription None None None None    Compliance with Home Oxygen Use Yes Yes Yes Yes      Goals/Expected Outcomes   Short Term Goals To learn and demonstrate proper pursed lip breathing techniques or other breathing techniques.  To learn and demonstrate proper pursed lip breathing techniques or other breathing techniques. ;To learn and exhibit compliance with exercise, home and travel O2 prescription;To learn and understand importance of monitoring SPO2 with pulse oximeter and demonstrate accurate use of the pulse oximeter.;To learn and understand importance of maintaining oxygen saturations>88%;To learn and demonstrate  proper use of respiratory medications To learn and demonstrate proper pursed lip breathing techniques or other breathing techniques. ;To learn and exhibit compliance with exercise, home and travel O2 prescription;To learn and understand importance of monitoring SPO2 with pulse oximeter and demonstrate accurate use of the pulse oximeter.;To learn and understand importance of maintaining oxygen saturations>88%;To learn and demonstrate proper use of respiratory medications To learn and demonstrate proper pursed lip breathing techniques or other breathing techniques. ;To learn and exhibit compliance with exercise, home and travel O2 prescription;To learn and understand importance of monitoring SPO2 with pulse oximeter and demonstrate accurate use of the pulse oximeter.;To learn and understand importance of maintaining oxygen saturations>88%;To learn and demonstrate proper use of respiratory medications    Long  Term Goals Exhibits proper breathing techniques, such as pursed lip breathing or other method taught during program session Exhibits proper breathing techniques, such as pursed lip breathing or other method taught during program session;Maintenance of O2 saturations>88%;Exhibits compliance with exercise, home  and travel O2 prescription;Compliance with respiratory medication;Verbalizes importance of monitoring SPO2 with pulse oximeter and return demonstration;Demonstrates proper use of MDI's Exhibits proper breathing techniques, such as pursed lip breathing or other method taught during program session;Maintenance of O2 saturations>88%;Exhibits compliance with exercise, home  and travel O2 prescription;Compliance with respiratory medication;Verbalizes importance of monitoring SPO2 with pulse oximeter and return demonstration;Demonstrates proper use of MDI's Exhibits proper breathing techniques, such as pursed lip breathing or other method taught during program session;Maintenance of O2 saturations>88%;Exhibits  compliance with exercise, home  and travel O2 prescription;Compliance with respiratory medication;Verbalizes importance of monitoring SPO2 with pulse oximeter and return demonstration;Demonstrates proper use of MDI's    Comments Reviewed PLB technique with pt.  Talked about how it works and it's importance in maintaining their exercise saturations. Eria is doing well in rehab.  She is using her oxygen regularly and staying compliant.  She is watching her saturations as well.  She has gotten better about using her PLB. Lexis is doing well, she did miss some time after a fall.  She is good about using her PLB and practices it routinely at home.  She is good about wearing her oxygen for activity.  She continues to keep an eye on her saturations as well. Mayre is   doing well with her breathing.  She is compliant with her PLB and oxygen therapy.  She continues to keep an eye on her saturations at home.  While exercising this week at home she did drop to 91% on the bike.    Goals/Expected Outcomes Short: Become more profiecient at using PLB.   Long: Become independent at using PLB. Short: Conitnue to work on PLB  Long: Continue to improve breathing Short: Conitneu to work on PLB Long: Conitnue to monitor lungs short; Continue to use PLB  Long; Continue to manage pulmonary disease             Oxygen Discharge (Final Oxygen Re-Evaluation):  Oxygen Re-Evaluation - 05/31/22 0809       Program Oxygen Prescription   Program Oxygen Prescription Continuous;E-Tanks    Liters per minute 2      Home Oxygen   Home Oxygen Device E-Tanks;Home Concentrator    Sleep Oxygen Prescription Continuous    Liters per minute 2    Home Exercise Oxygen Prescription Continuous    Liters per minute 2    Home Resting Oxygen Prescription None    Compliance with Home Oxygen Use Yes      Goals/Expected Outcomes   Short Term Goals To learn and demonstrate proper pursed lip breathing techniques or other breathing techniques.  ;To learn and exhibit compliance with exercise, home and travel O2 prescription;To learn and understand importance of monitoring SPO2 with pulse oximeter and demonstrate accurate use of the pulse oximeter.;To learn and understand importance of maintaining oxygen saturations>88%;To learn and demonstrate proper use of respiratory medications    Long  Term Goals Exhibits proper breathing techniques, such as pursed lip breathing or other method taught during program session;Maintenance of O2 saturations>88%;Exhibits compliance with exercise, home  and travel O2 prescription;Compliance with respiratory medication;Verbalizes importance of monitoring SPO2 with pulse oximeter and return demonstration;Demonstrates proper use of MDI's    Comments Vonzella is doing well with her breathing.  She is compliant with her PLB and oxygen therapy.  She continues to keep an eye on her saturations at home.  While exercising this week at home she did drop to 91% on the bike.    Goals/Expected Outcomes short; Continue to use PLB  Long; Continue to manage pulmonary disease             Initial Exercise Prescription:  Initial Exercise Prescription - 02/26/22 1100       Date of Initial Exercise RX and Referring Provider   Date 02/26/22    Referring Provider Ellison, Chi Jane MD      Oxygen   Oxygen Continuous    Liters 2    Maintain Oxygen Saturation 88% or higher      Treadmill   MPH 0.8    Grade 0    Minutes 15    METs 1.6      Recumbant Bike   Level 1    RPM 50    Watts 10    Minutes 15    METs 1.5      NuStep   Level 1    SPM 60    Minutes 15    METs 1.5      T5 Nustep   Level 1    SPM 60    Minutes 15    METs 1.5      Biostep-RELP   Level 1    SPM 40    Minutes 15    METs 2        Track   Laps 14    Minutes 15    METs 1.76      Prescription Details   Frequency (times per week) 2    Duration Progress to 30 minutes of continuous aerobic without signs/symptoms of physical distress       Intensity   THRR 40-80% of Max Heartrate 114-145    Ratings of Perceived Exertion 11-13    Perceived Dyspnea 0-4      Progression   Progression Continue to progress workloads to maintain intensity without signs/symptoms of physical distress.      Resistance Training   Training Prescription Yes    Weight 2 lb    Reps 10-15             Perform Capillary Blood Glucose checks as needed.  Exercise Prescription Changes:   Exercise Prescription Changes     Row Name 02/26/22 1100 03/27/22 1600 04/05/22 0800 04/10/22 1300 04/25/22 1000     Response to Exercise   Blood Pressure (Admit) 126/60 118/62 -- 116/64 98/60   Blood Pressure (Exercise) 124/70 124/68 -- 136/64 114/64   Blood Pressure (Exit) 128/66 98/60 -- 122/60 94/62   Heart Rate (Admit) 84 bpm 94 bpm -- 83 bpm 74 bpm   Heart Rate (Exercise) 113 bpm 102 bpm -- 112 bpm 109 bpm   Heart Rate (Exit) 91 bpm 94 bpm -- 95 bpm 89 bpm   Oxygen Saturation (Admit) 94 % 97 % -- 97 % 99 %   Oxygen Saturation (Exercise) 95 % 94 % -- 92 % 96 %   Oxygen Saturation (Exit) 96 % 97 % -- 97 % 97 %   Rating of Perceived Exertion (Exercise) 14 15 -- 13 15   Perceived Dyspnea (Exercise) 2 2 -- 2 3   Symptoms SOB, fatigue, chronic pain hips, knees, feet (8-9/10) SOB, fatigue -- SOB, fatigue SOB   Comments walk test results 3rd full session of exercise -- -- --   Duration -- Progress to 30 minutes of  aerobic without signs/symptoms of physical distress -- Progress to 30 minutes of  aerobic without signs/symptoms of physical distress Continue with 30 min of aerobic exercise without signs/symptoms of physical distress.   Intensity -- THRR unchanged -- THRR unchanged THRR unchanged     Progression   Progression -- Continue to progress workloads to maintain intensity without signs/symptoms of physical distress. -- Continue to progress workloads to maintain intensity without signs/symptoms of physical distress. Continue to progress workloads to  maintain intensity without signs/symptoms of physical distress.   Average METs -- 1.8 -- 2.21 2.5     Resistance Training   Training Prescription -- Yes -- Yes Yes   Weight -- 3 lb -- 3 lb 3 lb   Reps -- 10-15 -- 10-15 10-15     Interval Training   Interval Training -- No -- No No     Oxygen   Oxygen -- Continuous -- Continuous Continuous   Liters -- 2 -- 2 2     Recumbant Bike   Level -- 1 -- 1 --   Watts -- 13 -- 19 --   Minutes -- 15 -- 15 --   METs -- 2 -- 2.73 --     NuStep   Level -- 1 -- 1 3   Minutes -- 80 -- 15 15   METs -- -- -- -- 2.8     T5 Nustep   Level -- 1 -- 1 --   Minutes -- 15 --   15 --   METs -- 1.8 -- 1.9 --     Biostep-RELP   Level -- -- -- 1 2   SPM -- -- -- -- 50   Minutes -- -- -- 15 15   METs -- -- -- 2 2     Track   Laps -- -- -- -- 11   Minutes -- -- -- -- 15   METs -- -- -- -- 1.6     Home Exercise Plan   Plans to continue exercise at -- -- Home (comment)  walking, recumbent bike, bands, weights Home (comment)  walking, recumbent bike, bands, weights Home (comment)  walking, recumbent bike, bands, weights   Frequency -- -- Add 2 additional days to program exercise sessions. Add 2 additional days to program exercise sessions. Add 2 additional days to program exercise sessions.   Initial Home Exercises Provided -- -- 04/05/22 04/05/22 04/05/22     Oxygen   Maintain Oxygen Saturation -- 88% or higher -- 88% or higher 88% or higher    Row Name 05/08/22 1300 05/22/22 1400 06/05/22 1400         Response to Exercise   Blood Pressure (Admit) 112/74 112/64 102/60     Blood Pressure (Exit) 122/72 102/60 112/64     Heart Rate (Admit) 89 bpm 78 bpm 109 bpm     Heart Rate (Exercise) 102 bpm 107 bpm 140 bpm     Heart Rate (Exit) 94 bpm 82 bpm 116 bpm     Oxygen Saturation (Admit) 99 % 98 % 98 %     Oxygen Saturation (Exercise) 96 % 97 % 96 %     Oxygen Saturation (Exit) 98 % 98 % 97 %     Rating of Perceived Exertion (Exercise) 15 15 15      Perceived Dyspnea (Exercise) 3 3 3     Symptoms SOB SOB SOB     Duration Continue with 30 min of aerobic exercise without signs/symptoms of physical distress. Continue with 30 min of aerobic exercise without signs/symptoms of physical distress. Continue with 30 min of aerobic exercise without signs/symptoms of physical distress.     Intensity THRR unchanged THRR unchanged THRR unchanged       Progression   Progression Continue to progress workloads to maintain intensity without signs/symptoms of physical distress. Continue to progress workloads to maintain intensity without signs/symptoms of physical distress. Continue to progress workloads to maintain intensity without signs/symptoms of physical distress.     Average METs 2.27 1.88 2.29       Resistance Training   Training Prescription Yes Yes Yes     Weight 2 lb 3 lb 3 lb     Reps 10-15 10-15 10-15       Interval Training   Interval Training -- No No       Oxygen   Oxygen Continuous Continuous Continuous     Liters 2 2 2       Recumbant Bike   Level 1 -- 2     Minutes 15 -- 15     METs 2.51 -- 1.52       NuStep   Level 3 3 --     Minutes 30 15 --     METs 2.3 -- --       T5 Nustep   Level 1 -- 2     Minutes 15 -- 15     METs -- -- 1.8       Biostep-RELP     Level 1 1 3     Minutes 15 15 15     METs 2 2 3       Track   Laps -- 10 15     Minutes -- 15 15     METs -- 1.54 1.82       Home Exercise Plan   Plans to continue exercise at Home (comment)  walking, recumbent bike, bands, weights Home (comment)  walking, recumbent bike, bands, weights Home (comment)  walking, recumbent bike, bands, weights     Frequency Add 2 additional days to program exercise sessions. Add 2 additional days to program exercise sessions. Add 2 additional days to program exercise sessions.     Initial Home Exercises Provided 04/05/22 04/05/22 04/05/22       Oxygen   Maintain Oxygen Saturation 88% or higher 88% or higher 88% or higher               Exercise Comments:   Exercise Goals and Review:   Exercise Goals     Row Name 02/26/22 1202             Exercise Goals   Increase Physical Activity Yes       Intervention Provide advice, education, support and counseling about physical activity/exercise needs.;Develop an individualized exercise prescription for aerobic and resistive training based on initial evaluation findings, risk stratification, comorbidities and participant's personal goals.       Expected Outcomes Short Term: Attend rehab on a regular basis to increase amount of physical activity.;Long Term: Add in home exercise to make exercise part of routine and to increase amount of physical activity.;Long Term: Exercising regularly at least 3-5 days a week.       Increase Strength and Stamina Yes       Intervention Provide advice, education, support and counseling about physical activity/exercise needs.;Develop an individualized exercise prescription for aerobic and resistive training based on initial evaluation findings, risk stratification, comorbidities and participant's personal goals.       Expected Outcomes Short Term: Increase workloads from initial exercise prescription for resistance, speed, and METs.;Short Term: Perform resistance training exercises routinely during rehab and add in resistance training at home;Long Term: Improve cardiorespiratory fitness, muscular endurance and strength as measured by increased METs and functional capacity (6MWT)       Able to understand and use rate of perceived exertion (RPE) scale Yes       Intervention Provide education and explanation on how to use RPE scale       Expected Outcomes Short Term: Able to use RPE daily in rehab to express subjective intensity level;Long Term:  Able to use RPE to guide intensity level when exercising independently       Able to understand and use Dyspnea scale Yes       Intervention Provide education and explanation on how to use Dyspnea  scale       Expected Outcomes Short Term: Able to use Dyspnea scale daily in rehab to express subjective sense of shortness of breath during exertion;Long Term: Able to use Dyspnea scale to guide intensity level when exercising independently       Knowledge and understanding of Target Heart Rate Range (THRR) Yes       Intervention Provide education and explanation of THRR including how the numbers were predicted and where they are located for reference       Expected Outcomes Short Term: Able to state/look up THRR;Short Term: Able to use daily as guideline   for intensity in rehab;Long Term: Able to use THRR to govern intensity when exercising independently       Able to check pulse independently Yes       Intervention Provide education and demonstration on how to check pulse in carotid and radial arteries.;Review the importance of being able to check your own pulse for safety during independent exercise       Expected Outcomes Long Term: Able to check pulse independently and accurately;Short Term: Able to explain why pulse checking is important during independent exercise       Understanding of Exercise Prescription Yes       Intervention Provide education, explanation, and written materials on patient's individual exercise prescription       Expected Outcomes Short Term: Able to explain program exercise prescription;Long Term: Able to explain home exercise prescription to exercise independently                Exercise Goals Re-Evaluation :  Exercise Goals Re-Evaluation     Row Name 03/01/22 0934 03/27/22 1544 04/05/22 0810 04/10/22 1402 04/25/22 1107     Exercise Goal Re-Evaluation   Exercise Goals Review Able to understand and use rate of perceived exertion (RPE) scale;Knowledge and understanding of Target Heart Rate Range (THRR);Able to understand and use Dyspnea scale;Understanding of Exercise Prescription Increase Physical Activity;Increase Strength and Stamina;Understanding of Exercise  Prescription Increase Physical Activity;Increase Strength and Stamina;Understanding of Exercise Prescription;Able to understand and use rate of perceived exertion (RPE) scale;Knowledge and understanding of Target Heart Rate Range (THRR);Able to understand and use Dyspnea scale;Able to check pulse independently Increase Physical Activity;Increase Strength and Stamina;Understanding of Exercise Prescription Increase Physical Activity;Increase Strength and Stamina;Understanding of Exercise Prescription   Comments Reviewed RPE and dyspnea scales, THR and program prescription with pt today.  Pt voiced understanding and was given a copy of goals to take home. Marlies has had a good start to rehab. It is difficult for her to move around as she lacks the strength, it is also difficult for her to perform on the machines. Staff discussed with patient and thought she would benefit more from completing PT first, then coming back to pulmonary rehab. Referral was sent. In the meantime, patient will continue her exercise with us until she transfers over. She was able to try the T5 Nustep,  T4 Nustep, and recumbent bike. We will continue to monitor until we hear back from PT. Reviewed home exercise with pt today.  Pt plans to use bands and weights at home for exercise.  She also has a recumbent bike to use at home and walk with husband.  Reviewed THR, pulse, RPE, sign and symptoms, pulse oximetery and when to call 911 or MD.  Also discussed weather considerations and indoor options.  Pt voiced understanding. Aislee is doing well in rehab. While she has not increased her workloads on any machines yet, she has increased her average MET level to 2.21 METs. She also has tolerated using 3 lb weights for resistance training. We will continue to monitor her progress in the program. Parlee continues to do well in rehab. She was able to increase her workload to level 3 on the T4 Nustep for part of the time. It was challenging for her but happy  she was able to do it. She also walked 11 laps on the track which was her first time walking. We will continue to monitor.   Expected Outcomes Short: Use RPE daily to regulate intensity. Long: Follow program prescription in THR.   Short: Continue to follow exercise prescription safetly Long: Build up strength and stamina SHort; Start to add in exercise at home Long: Continue to build strength Short: Begin to increase workloads on seated machines. Long: Continue to increase strength and stamina. Short: Work on T4 Nustep at level 3 the entire 15 minutes Long: Continue to increase overall MET level    Row Name 05/01/22 0737 05/08/22 1347 05/22/22 1454 05/31/22 0751 06/05/22 1443     Exercise Goal Re-Evaluation   Exercise Goals Review Increase Physical Activity;Increase Strength and Stamina;Understanding of Exercise Prescription Increase Physical Activity;Increase Strength and Stamina;Understanding of Exercise Prescription Increase Physical Activity;Increase Strength and Stamina;Understanding of Exercise Prescription Increase Physical Activity;Increase Strength and Stamina;Understanding of Exercise Prescription Increase Physical Activity;Increase Strength and Stamina;Understanding of Exercise Prescription   Comments Ricketta missed the last couple of weeks due to family medical concerns and then a fall at home.  Prior to her fall she was using her bike for 20 min each day and 10 min worth of weights each day.  She  has also been working on her breathing too. Vernisha is doing well in rehab since returning from her fall. She was able to tolerate the T4 at level 3 for 30 minutes. She also was able to work at an average overall MET level of 2.27 METs. She did decrease her hand weights from 3 lb to 1 lb hand weights. We will continue to monitor her progress in the program. Elfreda is slowing getting back into the routine after she was out for a week or so. Hokulani was able to get 10 laps on the track and hope to see more walking  incorporated into her exercise regimen. She is back up to 3 lbs with her handweights and staying consistent at level 3 on the T4 Nustep. She would benefit from increasing her Biostep to level 2. Will continue to monitor. Latasia is doing well in rehab.  She is riding her recumbent bike at home for 20 min and then weights and back to bike again.  She is going to take home a set of 3 lb weights to increase.  She continues to improve her strength and stamina.  She stopped doing some of her PT exercises but was encouraged to get back to them again. She wants to be independent in self care so she keeps working. Riti is doing well in the program. She recently improved her overall average MET level back up above 2 METs. She has also done well with her seated machines as she improved to level 2 on the T5, level 3 on the biostep, and level 2 on the recumbent bike. She was able to walk up to 15 laps on the track as well. We will continue to monitor her progress in the program.   Expected Outcomes Short: Get back to exercise routine again Long: Continue to improve stamina Short: Continue to try walking more. Long: Continue to increase strength and stamina. Short: Increase to level 2 for Biostep Long: Continue to increase overall MET level Short: Get back to PT exercises for shoulder Long: Continue to improve stamina Short: Continue to push for more laps on the track. Long: Continue to improve strength and stamina.            Discharge Exercise Prescription (Final Exercise Prescription Changes):  Exercise Prescription Changes - 06/05/22 1400       Response to Exercise   Blood Pressure (Admit) 102/60    Blood Pressure (Exit) 112/64      Heart Rate (Admit) 109 bpm    Heart Rate (Exercise) 140 bpm    Heart Rate (Exit) 116 bpm    Oxygen Saturation (Admit) 98 %    Oxygen Saturation (Exercise) 96 %    Oxygen Saturation (Exit) 97 %    Rating of Perceived Exertion (Exercise) 15    Perceived Dyspnea (Exercise) 3     Symptoms SOB    Duration Continue with 30 min of aerobic exercise without signs/symptoms of physical distress.    Intensity THRR unchanged      Progression   Progression Continue to progress workloads to maintain intensity without signs/symptoms of physical distress.    Average METs 2.29      Resistance Training   Training Prescription Yes    Weight 3 lb    Reps 10-15      Interval Training   Interval Training No      Oxygen   Oxygen Continuous    Liters 2      Recumbant Bike   Level 2    Minutes 15    METs 1.52      T5 Nustep   Level 2    Minutes 15    METs 1.8      Biostep-RELP   Level 3    Minutes 15    METs 3      Track   Laps 15    Minutes 15    METs 1.82      Home Exercise Plan   Plans to continue exercise at Home (comment)   walking, recumbent bike, bands, weights   Frequency Add 2 additional days to program exercise sessions.    Initial Home Exercises Provided 04/05/22      Oxygen   Maintain Oxygen Saturation 88% or higher             Nutrition:  Target Goals: Understanding of nutrition guidelines, daily intake of sodium <1584m, cholesterol <2072m calories 30% from fat and 7% or less from saturated fats, daily to have 5 or more servings of fruits and vegetables.  Education: All About Nutrition: -Group instruction provided by verbal, written material, interactive activities, discussions, models, and posters to present general guidelines for heart healthy nutrition including fat, fiber, MyPlate, the role of sodium in heart healthy nutrition, utilization of the nutrition label, and utilization of this knowledge for meal planning. Follow up email sent as well. Written material given at graduation. Flowsheet [facility]  Date 05/03/22  [05/17/22 Part II]  [facility] Code 1- Verbalizes Understanding       Biometrics:  Pre Biometrics - 02/26/22 1202       Pre  Biometrics   Height 5' 3.5" (1.613 m)    Weight 186 lb 8 oz (84.6 kg)    Waist Circumference 34.5 inches    Hip Circumference 35.5 inches    Waist to Hip Ratio 0.97 %    BMI (Calculated) 32.51    Single Leg Stand 0 seconds              Nutrition Therapy Plan and Nutrition Goals:  Nutrition Therapy & Goals - 03/26/22 0904       Nutrition Therapy   Diet Heart healthy, low Na    Drug/Food Interactions Statins/Certain Fruits    Protein (specify units) 100-105g    Whole Grain Foods 2 servings   Her food intake is limited due to appetite   Fruits and Vegetables  5 servings/day   Her food intake is limited due to appetite   Sodium 2 grams      Personal Nutrition Goals   Nutrition Goal ST: consider adding in a nutritional supplement, practice mechanical eating, eat protein foods first, aim to include fiber, fat, and protein at most meals. LT: Meet energy/protein needs    Comments 61 y.o. F admitted to pulmonary rehab for stage 3 severe COPD by GOLD classification. PMHx breast cancer s/p chemotherapy and radiation as of 2022, HTN, atherosclerosis, T2DM. Relevant medications includes farxiga, metformin, zofran, nucynta, prochlorperazine, crestor, zoloft, cymbalta, tirzepatide, cyclobenzaprine, hydrochlorothiazide, oxycodone, senokot, lyrica, senokot, fluconazole, anastrozole. She reports limiting/avoiding carbohydrates as she is nervous they will turn to fat; discussed how carbohydrates packaged with fiber like fruit, starchy vegetables, and whole grains can contribute energy rich carbohydrates, fiber, as well as vitamins and minerals. She is interested in including these foods again. Since her chemotherapy she has not had any taste, but some of it is coming back such as sweet and salty flavors - she feels almost too well; a hard candy was too sweet for her and she had to spit it out. She reports that her appetite has been lower overall since the chemotherapy: good day she will have two meals and  a bad day she may only have one meal like 1/2 cup of cabbage. B: egg white with whole grain toast or egg white with cheese D: Her husband cooks for her. She reports that instead of spaghetti she will use spaghetti squash, taco salad with ground turkey, fish on the grill. She reports that she has lost some weight recently, she has been trying to lose; discussed how meeting calorie and protein needs is important. Discussed mechanical eating, how to include more calories with lower volume, eat protein rich foods first, and consider nutritional supplements. Discussed heart healthy eating, pulmonary MNT, as well as T2DM MNT.      Intervention Plan   Intervention Prescribe, educate and counsel regarding individualized specific dietary modifications aiming towards targeted core components such as weight, hypertension, lipid management, diabetes, heart failure and other comorbidities.    Expected Outcomes Short Term Goal: Understand basic principles of dietary content, such as calories, fat, sodium, cholesterol and nutrients.;Short Term Goal: A plan has been developed with personal nutrition goals set during dietitian appointment.;Long Term Goal: Adherence to prescribed nutrition plan.             Nutrition Assessments:  MEDIFICTS Score Key: ?70 Need to make dietary changes  40-70 Heart Healthy Diet ? 40 Therapeutic Level Cholesterol Diet  Flowsheet Row Pulmonary Rehab from 02/26/2022 in [facility]  Picture Your Plate Total Score on Admission 75      Picture Your Plate Scores: <40 Unhealthy dietary pattern with much room for improvement. 41-50 Dietary pattern unlikely to meet recommendations for good health and room for improvement. 51-60 More healthful dietary pattern, with some room for improvement.  >60 Healthy dietary pattern, although there may be some specific behaviors that could be improved.   Nutrition Goals Re-Evaluation:  Nutrition Goals Re-Evaluation     Row  Name 05/01/22 0752 05/31/22 0758           Goals   Nutrition Goal ST: consider adding in a nutritional supplement, practice mechanical eating, eat protein foods first, aim to include fiber, fat, and protein at most meals. LT: Meet energy/protein needs SHort; Continue mechanical eating until full Long: Continue to get in more protein        Comment Aaradhya is practicing mechanical eating as she knows she needs to eat, but still not feeling hungry.  She is still working on getting in more protein.  Her husband is trying to stay on top of her about it and makes sure she is eating daily. Leo is doing well. She is now eating when she is hungry.  She has not been doing her mechanincal eating.  She says that her stomach has been hurting which can be related to her increased stress. We talked about the importance of eating to fuel her system.  She was encouraged to try meal replacement shakes or protein boosters.  She said she tried before and it hurt her stomach.      Expected Outcome SHort; Continue mechanical eating until full Long: Continue to get in more protein Short: Find a source of protein she can drink. Long; Continue to work on eating more               Nutrition Goals Discharge (Final Nutrition Goals Re-Evaluation):  Nutrition Goals Re-Evaluation - 05/31/22 0758       Goals   Nutrition Goal SHort; Continue mechanical eating until full Long: Continue to get in more protein    Comment Mala is doing well. She is now eating when she is hungry.  She has not been doing her mechanincal eating.  She says that her stomach has been hurting which can be related to her increased stress. We talked about the importance of eating to fuel her system.  She was encouraged to try meal replacement shakes or protein boosters.  She said she tried before and it hurt her stomach.    Expected Outcome Short: Find a source of protein she can drink. Long; Continue to work on eating more              Psychosocial: Target Goals: Acknowledge presence or absence of significant depression and/or stress, maximize coping skills, provide positive support system. Participant is able to verbalize types and ability to use techniques and skills needed for reducing stress and depression.   Education: Stress, Anxiety, and Depression - Group verbal and visual presentation to define topics covered.  Reviews how body is impacted by stress, anxiety, and depression.  Also discusses healthy ways to reduce stress and to treat/manage anxiety and depression.  Written material given at graduation. Flowsheet Row Pulmonary Rehab from [facility]/31/2022 in [facility]  Date 04/11/22  Educator [facility]  Instruction Review Code 1- [employer] Understanding       Education: Sleep Hygiene -Provides group verbal and written instruction about how sleep can affect your health.  Define sleep hygiene, discuss sleep cycles and impact of sleep habits. Review good sleep hygiene tips.    Initial Review & Psychosocial Screening:  Initial Psych Review & Screening - 02/26/22 0748       Initial Review   Current issues with Current Depression;Current Psychotropic Meds;History of Depression;Current Stress Concerns    Source of Stress Concerns Chronic Illness;Unable to participate in former interests or hobbies;Unable to perform yard/household activities    Comments currently on sertaline, good days and bad days mostly well managed, getting eyes fixed to be able to see again      [place]? Yes   Husband, kids (lives near by)   Comments 4 kids one calls daily , other three drop in      Barriers   Psychosocial barriers to participate in program The patient should  benefit from training in stress management and relaxation.;Psychosocial barriers identified (see note)      Screening Interventions   Interventions Encouraged to exercise;To provide support and resources with identified  psychosocial needs;Provide feedback about the scores to participant    Expected Outcomes Short Term goal: Utilizing psychosocial counselor, staff and physician to assist with identification of specific Stressors or current issues interfering with healing process. Setting desired goal for each stressor or current issue identified.;Long Term Goal: Stressors or current issues are controlled or eliminated.;Short Term goal: Identification and review with participant of any Quality of Life or Depression concerns found by scoring the questionnaire.;Long Term goal: The participant improves quality of Life and PHQ9 Scores as seen by post scores and/or verbalization of changes             Quality of Life Scores:  Scores of 19 and below usually indicate a poorer quality of life in these areas.  A difference of  2-3 points is a clinically meaningful difference.  A difference of 2-3 points in the total score of the Quality of Life Index has been associated with significant improvement in overall quality of life, self-image, physical symptoms, and general health in studies assessing change in quality of life.  PHQ-9: Review Flowsheet       05/31/2022 05/01/2022 02/26/2022  Depression screen PHQ 2/9  Decreased Interest _0 Down, Depressed, Hopeless _1 PHQ - 2 Score _2 Altered sleeping 0 0 3  Tired, decreased energy _3 Change in appetite _4 Feeling bad or failure about yourself  _5 Trouble concentrating _6 Moving slowly or fidgety/restless _7 Suicidal thoughts 0 0 0  PHQ-9 Score _8 Difficult doing work/chores Somewhat difficult Somewhat difficult Somewhat difficult   Interpretation of Total Score  Total Score Depression Severity:  1-4 = Minimal depression, 5-9 = Mild depression, 10-14 = Moderate depression, 15-19 = Moderately severe depression, 20-27 = Severe depression   Psychosocial Evaluation and Intervention:   Psychosocial Re-Evaluation:  Psychosocial  Re-Evaluation     Row Name 04/05/22 0811 05/01/22 0739 05/31/22 0755         Psychosocial Re-Evaluation   Current issues with Current Depression;Current Anxiety/Panic;Current Psychotropic Meds;Current Sleep Concerns;Current Stress Concerns Current Depression;Current Anxiety/Panic;Current Psychotropic Meds;Current Sleep Concerns;Current Stress Concerns Current Depression;Current Anxiety/Panic;Current Psychotropic Meds;Current Sleep Concerns;Current Stress Concerns     Comments Verdis is doing well in rehab.  She has been taking her meds still and feeling pretty good.  She does not feel perfectly balanced yet, but she is working with her doctor.  She continues to struggle with her sleep as she only gets about 3-4 hrs a night and then wakes up and unable to go to sleep.  She has done melatonin before and has tried 51m and 7.5 mg but not ten yet, so she will try.  She conitnues to work on it, we talked about importance of sleep too. DJoleahhas been doing well up until a fall last week.  Her PHQ has greatly improved from 17 down to 9.  She is feeling better up until her fall.  She is sleeping better and trying to find the good.  She just get frustrated that she is unable to do everything she wants to.  She wants to continue to build up her strength and stamina.  She is feeling like her mood is balanced DArdeanis  doing well in rehab.  She saw her doctor yesterday and they increased her depression meds to help her cope better.  Her husband has just been diagnosised with cancer and they are now undergoing lots of appts and blood work to [employer] treatment.  He will have surgery in January and they hope to remove it.  She is sleeping good despite getting up in the night at eat.  Her PHQ has increased slightly, but she just had a med increase and has a lot of stress with her husband recently     Expected Outcomes -- Short: Get back to routine exercise again Long: Continue to exercise to build stamina. Short; Conitnue  to cope with husbands diagnosis Long: Conitnue to exercise for mental boost     Interventions Stress management education;Encouraged to attend Pulmonary Rehabilitation for the exercise Stress management education;Encouraged to attend Pulmonary Rehabilitation for the exercise Stress management education;Encouraged to attend Pulmonary Rehabilitation for the exercise     Continue Psychosocial Services  Follow up required by staff Follow up required by staff Follow up required by staff              Psychosocial Discharge (Final Psychosocial Re-Evaluation):  Psychosocial Re-Evaluation - 05/31/22 0755       Psychosocial Re-Evaluation   Current issues with Current Depression;Current Anxiety/Panic;Current Psychotropic Meds;Current Sleep Concerns;Current Stress Concerns    Comments Norrine is doing well in rehab.  She saw her doctor yesterday and they increased her depression meds to help her cope better.  Her husband has just been diagnosised with cancer and they are now undergoing lots of appts and blood work to [employer] treatment.  He will have surgery in January and they hope to remove it.  She is sleeping good despite getting up in the night at eat.  Her PHQ has increased slightly, but she just had a med increase and has a lot of stress with her husband recently    Expected Outcomes Short; Conitnue to cope with husbands diagnosis Long: Conitnue to exercise for mental boost    Interventions Stress management education;Encouraged to attend Pulmonary Rehabilitation for the exercise    Continue Psychosocial Services  Follow up required by staff             Education: Education Goals: Education classes will be provided on a weekly basis, covering required topics. Participant will state understanding/return demonstration of topics presented.  Learning Barriers/Preferences:  Learning Barriers/Preferences - 02/26/22 0747       Learning Barriers/Preferences   Learning Barriers Sight   eye lids  drooping   Learning Preferences Skilled Demonstration;Verbal Instruction             General Pulmonary Education Topics:  Infection Prevention: - Provides verbal and written material to individual with discussion of infection control including proper hand washing and proper equipment cleaning during exercise session. Flowsheet Row Pulmonary Rehab from 05/31/2022 in [facility] Cardiac and Pulmonary Rehab  Date 02/26/22  Educator [facility]  Instruction Review Code 1- Verbalizes Understanding       Falls Prevention: - Provides verbal and written material to individual with discussion of falls prevention and safety. Flowsheet Row Pulmonary Rehab from 05/31/2022 in [facility] Cardiac and Pulmonary Rehab  Date 02/26/22  Educator [facility]  Instruction Review Code 1- Verbalizes Understanding       Chronic Lung Disease Review: - Group verbal instruction with posters, models, PowerPoint presentations and videos,  to review new updates, new respiratory medications, new advancements in procedures and treatments. Providing information  on websites and "800" numbers for continued self-education. Includes information about supplement oxygen, available portable oxygen systems, continuous and intermittent flow rates, oxygen safety, concentrators, and Medicare reimbursement for oxygen. Explanation of Pulmonary Drugs, including class, frequency, complications, importance of spacers, rinsing mouth after steroid MDI's, and proper cleaning methods for nebulizers. Review of basic lung anatomy and physiology related to function, structure, and complications of lung disease. Review of risk factors. Discussion about methods for diagnosing sleep apnea and types of masks and machines for OSA. Includes a review of the use of types of environmental controls: home humidity, furnaces, filters, dust mite/pet prevention, HEPA vacuums. Discussion about weather changes, air quality and the benefits of nasal washing. Instruction on Warning signs,  infection symptoms, calling MD promptly, preventive modes, and value of vaccinations. Review of effective airway clearance, coughing and/or vibration techniques. Emphasizing that all should Create an Action Plan. Written material given at graduation. Flowsheet Row Pulmonary Rehab from 05/31/2022 in [facility] Cardiac and Pulmonary Rehab  Education need identified 02/26/22  Date 04/05/22  Educator [facility]  Instruction Review Code 1- Verbalizes Understanding       AED/CPR: - Group verbal and written instruction with the use of models to demonstrate the basic use of the AED with the basic ABC's of resuscitation.    Anatomy and Cardiac Procedures: - Group verbal and visual presentation and models provide information about basic cardiac anatomy and function. Reviews the testing methods done to diagnose heart disease and the outcomes of the test results. Describes the treatment choices: Medical Management, Angioplasty, or Coronary Bypass Surgery for treating various heart conditions including Myocardial Infarction, Angina, Valve Disease, and Cardiac Arrhythmias.  Written material given at graduation. Flowsheet Row Pulmonary Rehab from 05/31/2022 in [facility] Cardiac and Pulmonary Rehab  Date 05/24/22  Educator SB  Instruction Review Code 1- Verbalizes Understanding       Medication Safety: - Group verbal and visual instruction to review commonly prescribed medications for heart and lung disease. Reviews the medication, class of the drug, and side effects. Includes the steps to properly store meds and maintain the prescription regimen.  Written material given at graduation. Flowsheet Row Pulmonary Rehab from 05/24/2022 in [facility] Cardiac and Pulmonary Rehab  Date 03/22/22  Educator SB  Instruction Review Code 1- Verbalizes Understanding       Other: -Provides group and verbal instruction on various topics (see comments)   Knowledge Questionnaire Score:  Knowledge Questionnaire Score - 02/26/22 1204        Knowledge Questionnaire Score   Pre Score 12/18              Core Components/Risk Factors/Patient Goals at Admission:  Personal Goals and Risk Factors at Admission - 02/26/22 0751       Core Components/Risk Factors/Patient Goals on Admission    Weight Management Yes;Weight Loss;Obesity    Intervention Weight Management: Develop a combined nutrition and exercise program designed to reach desired caloric intake, while maintaining appropriate intake of nutrient and fiber, sodium and fats, and appropriate energy expenditure required for the weight goal.;Weight Management: Provide education and appropriate resources to help participant work on and attain dietary goals.;Weight Management/Obesity: Establish reasonable short term and long term weight goals.;Obesity: Provide education and appropriate resources to help participant work on and attain dietary goals.    Admit Weight 186 lb 8 oz (84.6 kg)    Goal Weight: Short Term 182 lb (82.6 kg)    Goal Weight: Long Term 180 lb (81.6 kg)    Expected Outcomes  Short Term: Continue to assess and modify interventions until short term weight is achieved;Long Term: Adherence to nutrition and physical activity/exercise program aimed toward attainment of established weight goal;Weight Loss: Understanding of general recommendations for a balanced deficit meal plan, which promotes 1-2 lb weight loss per week and includes a negative energy balance of [phone] kcal/d;Understanding recommendations for meals to include 15-35% energy as protein, 25-35% energy from fat, 35-60% energy from carbohydrates, less than 295m of dietary cholesterol, 20-35 gm of total fiber daily;Understanding of distribution of calorie intake throughout the day with the consumption of 4-5 meals/snacks    Improve shortness of breath with ADL's Yes    Intervention Provide education, individualized exercise plan and daily activity instruction to help decrease symptoms of SOB with activities  of daily living.    Expected Outcomes Short Term: Improve cardiorespiratory fitness to achieve a reduction of symptoms when performing ADLs;Long Term: Be able to perform more ADLs without symptoms or delay the onset of symptoms    Increase knowledge of respiratory medications and ability to use respiratory devices properly  Yes    Intervention Provide education and demonstration as needed of appropriate use of medications, inhalers, and oxygen therapy.    Expected Outcomes Short Term: Achieves understanding of medications use. Understands that oxygen is a medication prescribed by physician. Demonstrates appropriate use of inhaler and oxygen therapy.;Long Term: Maintain appropriate use of medications, inhalers, and oxygen therapy.    Diabetes Yes    Intervention Provide education about signs/symptoms and action to take for hypo/hyperglycemia.;Provide education about proper nutrition, including hydration, and aerobic/resistive exercise prescription along with prescribed medications to achieve blood glucose in normal ranges: Fasting glucose 65-99 mg/dL    Expected Outcomes Short Term: Participant verbalizes understanding of the signs/symptoms and immediate care of hyper/hypoglycemia, proper foot care and importance of medication, aerobic/resistive exercise and nutrition plan for blood glucose control.;Long Term: Attainment of HbA1C < 7%.    Hypertension Yes    Intervention Provide education on lifestyle modifcations including regular physical activity/exercise, weight management, moderate sodium restriction and increased consumption of fresh fruit, vegetables, and low fat dairy, alcohol moderation, and smoking cessation.;Monitor prescription use compliance.    Expected Outcomes Short Term: Continued assessment and intervention until BP is < 140/961mHG in hypertensive participants. < 130/8038mG in hypertensive participants with diabetes, heart failure or chronic kidney disease.;Long Term: Maintenance of  blood pressure at goal levels.    Lipids Yes    Intervention Provide education and support for participant on nutrition & aerobic/resistive exercise along with prescribed medications to achieve LDL <29m16mDL >40mg18m Expected Outcomes Short Term: Participant states understanding of desired cholesterol values and is compliant with medications prescribed. Participant is following exercise prescription and nutrition guidelines.;Long Term: Cholesterol controlled with medications as prescribed, with individualized exercise RX and with personalized nutrition plan. Value goals: LDL < 29mg,51m > 40 mg.             Education:Diabetes - Individual verbal and written instruction to review signs/symptoms of diabetes, desired ranges of glucose level fasting, after meals and with exercise. Acknowledge that pre and post exercise glucose checks will be done for 3 sessions at entry of program. Flowsheet Row [facility]monary [facility]  Date 02/26/22  [facility]- Verbalizes Understanding       Know Your Numbers and Heart Failure: - Group verbal and visual instruction to discuss disease risk factors for cardiac and  pulmonary disease and treatment options.  Reviews associated critical values for Overweight/Obesity, Hypertension, Cholesterol, and Diabetes.  Discusses basics of heart failure: signs/symptoms and treatments.  Introduces Heart Failure Zone chart for action plan for heart failure.  Written material given at graduation. Flowsheet Row Pulmonary Rehab from 05/31/2022 in [facility] Cardiac and Pulmonary Rehab  Date 03/29/22  Educator SB  Instruction Review Code 1- Verbalizes Understanding       Core Components/Risk Factors/Patient Goals Review:   Goals and Risk Factor Review     Row Name 04/05/22 0815 05/01/22 0754 05/31/22 0803         Core Components/Risk Factors/Patient Goals Review   Personal Goals Review Weight  Management/Obesity;Improve shortness of breath with ADL's;Increase knowledge of respiratory medications and ability to use respiratory devices properly.;Diabetes;Hypertension;Lipids Weight Management/Obesity;Improve shortness of breath with ADL's;Increase knowledge of respiratory medications and ability to use respiratory devices properly.;Diabetes;Hypertension;Lipids Weight Management/Obesity;Improve shortness of breath with ADL's;Increase knowledge of respiratory medications and ability to use respiratory devices properly.;Diabetes;Hypertension;Lipids     Review Skyler is doing well in rehab.  Her weight has been trending down.  Her breathing is getting better for most part but still has harder days.  She is doing well with her inhalers and nebulizers.  Her sugars are doing well around 120s in the morning.  Her pressures have been staying good.  She continues to check both at home. Merrily is doing well in rehab. She missed some time with medical appointments and a fall last week.  She is doing better with her breahing and practices it at home.  She is keeping her weight steady and watching her pressures and sugars at home with her husbands help.  She is good about using her nebulizer and inhalers. Sari is doing well in rehab.  Saphyra is losing weight.  We talked about adding in protein to her diet.  She is finding that she is doing better with her breathing for most part.  She still has some bad breathing days, but it is getting better.  She continues to build her stamina to be able to do to do more around the house.  Her blood sugars are stable feeling and her Alc was 6.6!!  She was unable to get a new meter on her insurance so her doctor ordered her another meter.   She continues to work to get it down.  Her pressures have been on the lower side but she has not had problems with it.     Expected Outcomes Short: Conintue to work on breathing Long: Conitnue to monitor risk factors Short: Conitnue to work on  breathing Long: Conitnue to monitor weight to not lose SHort Get new meter Long: Continue to monitor risk factors              Core Components/Risk Factors/Patient Goals at Discharge (Final Review):   Goals and Risk Factor Review - 05/31/22 0803       Core Components/Risk Factors/Patient Goals Review   Personal Goals Review Weight Management/Obesity;Improve shortness of breath with ADL's;Increase knowledge of respiratory medications and ability to use respiratory devices properly.;Diabetes;Hypertension;Lipids    Review Jaqueline is doing well in rehab.  Zahira is losing weight.  We talked about adding in protein to her diet.  She is finding that she is doing better with her breathing for most part.  She still has some bad breathing days, but it is getting better.  She continues to build her stamina to be able to do to do more   around the house.  Her blood sugars are stable feeling and her Alc was 6.6!!  She was unable to get a new meter on her insurance so her doctor ordered her another meter.   She continues to work to get it down.  Her pressures have been on the lower side but she has not had problems with it.    Expected Outcomes SHort Get new meter Long: Continue to monitor risk factors             ITP Comments:  ITP Comments     Row Name 02/26/22 1155 03/21/22 0723 03/26/22 1439 03/27/22 1545 04/18/22 0935   ITP Comments Completed 6MWT and gym orientation. Initial ITP created and sent for review to Dr. Zetta Bills, Medical Director.  Documentation for diagnosis can be found in [facility] encounter 01/12/22. 30 Day review completed. Medical Director ITP review done, changes made as directed, and signed approval by Medical Director.    New to program Completed initial RD consultation Staff felt that patient would better benefit from going to Physical Therapy first prior to finishing rehab.  She would do better in rehab if she could build up her strength and balance in physical therapy.  Note sent  to MD to request PT referral. We will continue with rehab until set up.  Staff spoke with Vicci and her husband and they both agreed and voiced understanding. 30 Day review completed. Medical Director ITP review done, changes made as directed, and signed approval by Medical Director.    Fara has continued PR and is showing improvement with independence of movement    Row Name 05/01/22 0736 05/16/22 0808 06/04/22 1242 06/13/22 1021     ITP Comments Nishita had a fall off the toliet last week which kept her out for an extra day. 30 Day review completed. Medical Director ITP review done, changes made as directed, and signed approval by Medical Director. Samhitha will be out for several weeks as her husband is getting surgery and she will not have transportation to rehab- she also wants to be there for for him. We will place patient on hold and contact her after the New Year to see when she will return. 30 Day review completed. Medical Director ITP review done, changes made as directed, and signed approval by Medical Director.             Comments:

## 2022-06-19 ENCOUNTER — Encounter: Payer: Self-pay | Admitting: *Deleted

## 2022-07-03 ENCOUNTER — Telehealth: Payer: Self-pay | Admitting: *Deleted

## 2022-07-03 ENCOUNTER — Encounter: Payer: Self-pay | Admitting: *Deleted

## 2022-07-03 DIAGNOSIS — J449 Chronic obstructive pulmonary disease, unspecified: Secondary | ICD-10-CM

## 2022-07-03 NOTE — Telephone Encounter (Signed)
Called patient to check in.  She is doing her exercise at home, but her husband still cannot drive.  His next follow up in on 07/20/22.  We have extended her medical hold until then.  She will keep [place] posted on his ability to drive.

## 2022-07-05 ENCOUNTER — Ambulatory Visit: Payer: HMO

## 2022-07-10 ENCOUNTER — Ambulatory Visit: Payer: HMO

## 2022-07-12 ENCOUNTER — Ambulatory Visit: Payer: HMO

## 2022-07-17 ENCOUNTER — Ambulatory Visit: Payer: HMO

## 2022-07-19 ENCOUNTER — Ambulatory Visit: Payer: HMO

## 2022-07-24 ENCOUNTER — Telehealth: Payer: Self-pay | Admitting: Pharmacist

## 2022-07-24 ENCOUNTER — Ambulatory Visit: Payer: HMO

## 2022-07-24 DIAGNOSIS — E119 Type 2 diabetes mellitus without complications: Secondary | ICD-10-CM

## 2022-07-24 MED ORDER — MOUNJARO 5 MG/0.5ML ~~LOC~~ SOAJ: 5.0000 mg | SUBCUTANEOUS | 2 refills | Status: AC

## 2022-07-24 NOTE — Addendum Note (Signed)
Addended by: Rollen Sox on: 07/24/2022 01:09 PM   Modules accepted: Orders

## 2022-07-24 NOTE — Telephone Encounter (Signed)
Patients husband called and reports Mounjaro needs updated PA. Key:  [id number]

## 2022-07-24 NOTE — Telephone Encounter (Signed)
PA approved until 07/25/23

## 2022-07-26 ENCOUNTER — Ambulatory Visit: Payer: HMO

## 2022-07-27 ENCOUNTER — Ambulatory Visit (INDEPENDENT_AMBULATORY_CARE_PROVIDER_SITE_OTHER): Payer: HMO | Admitting: Pulmonary Disease

## 2022-07-27 ENCOUNTER — Encounter (HOSPITAL_BASED_OUTPATIENT_CLINIC_OR_DEPARTMENT_OTHER): Payer: Self-pay | Admitting: Pulmonary Disease

## 2022-07-27 VITALS — BP 110/78 | HR 84 | Ht 64.0 in | Wt 164.0 lb

## 2022-07-27 DIAGNOSIS — J9611 Chronic respiratory failure with hypoxia: Secondary | ICD-10-CM

## 2022-07-27 DIAGNOSIS — R0602 Shortness of breath: Secondary | ICD-10-CM

## 2022-07-27 DIAGNOSIS — J449 Chronic obstructive pulmonary disease, unspecified: Secondary | ICD-10-CM

## 2022-07-27 MED ORDER — BREZTRI AEROSPHERE 160-9-4.8 MCG/ACT IN AERO: 2.0000 | INHALATION_SPRAY | Freq: Two times a day (BID) | RESPIRATORY_TRACT | 5 refills | Status: DC

## 2022-07-27 NOTE — Patient Instructions (Signed)
Dyspnea on exertion - multifactorial with deconditioning, severe COPD and hx chemotherapy/radiation Severe COPD (non-smoker) Hx of chemotherapy/radiation --CONTINUE Breztri TWO puffs TWICE a day --CONTINUE Albuterol TWO puffs AS NEEDED for shortness of breath or wheezing. Use before exercise --RESTART Pulmonary Rehab --Continue regular aerobic exercise  Chronic hypoxemic respiratory failure Exertional hypoxemia --Uses oxygen with pulmonary rehab only if needed --CONTINUE supplemental oxygen as needed for goal SpO2 >88%  Follow-up with me in 6 months

## 2022-07-27 NOTE — Progress Notes (Signed)
Subjective:   PATIENT ID: Ruth White GENDER: female DOB: 12-Jan-1961, MRN: [id number]   HPI  Chief Complaint  Patient presents with   Follow-up    Pulm rehab has helped had to stop for a bit since spouse dx cancer on the mend now. And back scheduled for pulm rehap   Reason for Visit: Follow-up  Ruth White is a 62 year old female never smoker with history of multifocal stage Ia breast cancer status post lumpectomy 07/2019 and radiation on adjuvant therapy, peripheral neuropathy, depression who presents for follow-up  Initial Consult 07/10/21 She is referred by her PCP. Note reviewed from 06/14/21. She reports shortness of breath after completing chemotherapy in August/September. Associated with coughing. Occasional wheezing. No chest congestion. Occurs with any exertion including with walking, bathing and talking. Bending down is difficult for her due to dizziness. She had a negative CXR in the fall and normal SpO2. She has tried on Trelegy for one week but unable to take due to feeling choked up. She was switched to [facility] and has been compliant for the last month. She reports partial improvement. Before her cancer treatment two years ago she is extremely active and functional.  08/22/21 She presented for PFT evaluation. Since our last visit she has been taking Breztri with partial improvement ~30%. Her shortness of breath and coughing has improved. Occasional wheezing. She is walking two laps around the yard. Husband is present and reports she is more active.  11/27/21 Husband present and provides additional history with patient. She is compliant with Breztri twice daily. She takes albuterol once a day. She continues to shortness of breath. Occasional deep wheeze and cough with exertion. Not currently in Pulmonary Rehab due to frequent doctor visits and fatigue. She was recently hospitalized [facility] (5/4-10/24/21) for colitis requiring antibiotics (amoxicillin).  Denies needing vancomycin. She walking around with the walker at the house daily but has low energy at times.   01/12/22 Husband present visit and provides additional history.  Since our last visit she has had shortness of breath. Trying to be more active walking 4000-5000 steps a day. Has some wheezing and coughing. Compliant with Breztri. She will use her albuterol 3-4 times a day, usually when she isn't wearing oxygen. She reports on room air that her pulse oximetry will read as low as 91%.  She was previously referred to pulmonary rehab but states that she is unable to make it to classes 3 days a week due to transportation issues.  03/20/22 Since our last visit she has been compliant on Breztri. She has started on Pulmonary Rehab and wears 2L. She is starting to walk more. Still feels short of breath when rushed. Still has some wheezing and coughing. Uses albuterol 2-3 times a day with activity. Has some symptoms at night. No exacerbations since our last visit  07/27/22 Since our last visit she has participated in Pulmonary rehab but has had medical hold for January due to her husbands cancer. She is ready to get to re-enroll. Does feel like they will benefit. Does have chronic cough and some wheezing with deep breaths but shortness of breath has improved.  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, left ([place])    Malignant neoplasm of nipple or areola of female breast, left (  [place])    Personal history of chemotherapy    Personal history of radiation therapy      Allergies  Allergen Reactions   Molnupiravir Nausea And Vomiting     Outpatient Medications Prior to Visit  Medication Sig Dispense Refill   ACCU-CHEK GUIDE test strip 1 (ONE) EACH DAILY  AND AS NEEDED FOR SYMPTOMS     Accu-Chek Softclix Lancets lancets USE 1 LANCET DAILY AS DIRECTED     anastrozole (ARIMIDEX) 1 MG tablet TAKE 1 TABLET BY MOUTH EVERY DAY TO PREVENT FUTURE DISEASE RECURRENCE 90 tablet 3   aspirin 325 MG EC tablet Take 325 mg by mouth daily.     atenolol (TENORMIN) 50 MG tablet Take 50 mg by mouth daily.     Blood Glucose Monitoring Suppl (ACCU-CHEK GUIDE ME) w/Device KIT 1 (ONE) KIT CHECK BLOOD GLUCOSE DAILY     cyclobenzaprine (FLEXERIL) 10 MG tablet Take 1 tablet by mouth 2 (two) times daily as needed.     diclofenac Sodium (VOLTAREN) 1 % GEL Apply topically.     Evolocumab (REPATHA SURECLICK) [id number] MG/ML SOAJ Inject 1 mL into the skin every 14 (fourteen) days. 2 mL 11   ezetimibe (ZETIA) 10 MG tablet Take 10 mg by mouth at bedtime.     FARXIGA 10 MG TABS tablet Take 10 mg by mouth every morning.     glucose blood (ACCU-CHEK GUIDE) test strip 1 (ONE) EACH DAILY AND AS NEEDED FOR SYMPTOMS     ibuprofen (ADVIL) 800 MG tablet Take 800 mg by mouth every 8 (eight) hours as needed.     lactulose, encephalopathy, (CHRONULAC) 10 GM/15ML SOLN Take 15 mLs (10 g total) by mouth daily. 473 mL 2   lisinopril (ZESTRIL) 20 MG tablet Take 20 mg by mouth 2 (two) times daily.     magic mouthwash SOLN Take 5 mLs by mouth.     metFORMIN (GLUCOPHAGE-XR) 500 MG 24 hr tablet Take 500 mg by mouth daily.     naloxone (NARCAN) nasal spray 4 mg/0.1 mL Use as directed for overdose of narcotic medication 1 each 2   NUCYNTA 50 MG tablet Take 50 mg by mouth 4 (four) times daily.     ondansetron (ZOFRAN) 4 MG tablet Take 4 mg by mouth every 8 (eight) hours as needed for nausea or vomiting.     prochlorperazine (COMPAZINE) 10 MG tablet Take 1 tablet (10 mg total) by mouth every 6 (six) hours as needed for nausea or vomiting. 30 tablet 1   rosuvastatin (CRESTOR) 40 MG tablet Take 40 mg by mouth daily.     sertraline (ZOLOFT) 50 MG tablet Take 1 tablet by mouth daily.     tirzepatide [facility]) 5  MG/0.5ML Pen Inject 5 mg into the skin once a week. 2 mL 2   VENTOLIN HFA 108 (90 Base) MCG/ACT inhaler TAKE 2 PUFFS BY MOUTH EVERY 6 HOURS AS NEEDED FOR WHEEZE OR SHORTNESS OF BREATH 18 each 2   BREZTRI AEROSPHERE 160-9-4.8 MCG/ACT AERO Inhale 2 puffs into the lungs in the morning and at bedtime. 10.7 g 5   BD PEN NEEDLE NANO 2ND GEN 32G X 4 MM MISC 1 (ONE) PEN NEEDLE USE DAILY     Oxycodone HCl 10 MG TABS Take 0.5 tablets (5 mg total) by mouth every 4 (four) hours as needed. 120 tablet 0   pregabalin (LYRICA) 75 MG capsule TAKE 1 CAPSULE BY MOUTH TWICE A DAY 180 capsule 0   senna (SENOKOT) 8.6 MG tablet Take  1 tablet by mouth daily. Take 1-2 tablets (Patient not taking: Reported on 01/24/2022)     DULoxetine (CYMBALTA) 30 MG capsule Take 30 mg by mouth daily. (Patient not taking: Reported on 01/24/2022)     hydrochlorothiazide (HYDRODIURIL) 12.5 MG tablet Take by mouth. (Patient not taking: Reported on 01/24/2022)     No facility-administered medications prior to visit.    Review of Systems  Constitutional:  Negative for chills, diaphoresis, fever, malaise/fatigue and weight loss.  HENT:  Negative for congestion.   Respiratory:  Positive for cough and wheezing. Negative for hemoptysis, sputum production and shortness of breath.   Cardiovascular:  Negative for chest pain, palpitations and leg swelling.     Objective:   Vitals:   07/27/22 0930  BP: 110/78  Pulse: 84  SpO2: 100%  Weight: 164 lb (74.4 kg)  Height: 5' 4"$  (1.626 m)   SpO2: 100 % O2 Device: None (Room air)  Physical Exam: General: Well-appearing, no acute distress HENT: [place], AT Eyes: EOMI, no scleral icterus Respiratory: Clear to auscultation bilaterally.  No crackles, wheezing or rales Cardiovascular: RRR, -M/R/G, no JVD Extremities:-Edema,-tenderness Neuro: AAO x4, CNII-XII grossly intact Psych: Normal mood, normal affect  Data Reviewed:  Imaging: CXR 07/10/2021- No infiltrate, effusion or edema. Low lung  volumes. CT chest 12/18/2021-overall normal lung parenchyma  PFT: 08/22/21  FVC 1.05 (39%) FEV1 0.67 (31%) Ratio 67   Unable to complete TLC or DLCO due to effort Interpretation: Very severe obstructive defect. No significant bronchodilator response  Labs: BMET from 12/30/2020 reviewed.  No abnormalities.  Normal electrolytes and renal function   Assessment & Plan:   Discussion: 62 year old female with hx of multifocal stage Ia breast cancer s/p lumpectomy 07/2021 and radiation on adjuvant therapy, peripheral neuropathy, depression who presents for follow-up.  Improving dyspnea. Likely related to deconditioning and severe COPD. No exacerbations in >6 months  Dyspnea on exertion - multifactorial with deconditioning, severe COPD and hx chemotherapy/radiation Severe COPD (non-smoker) Hx of chemotherapy/radiation --CONTINUE Breztri TWO puffs TWICE a day --CONTINUE Albuterol TWO puffs AS NEEDED for shortness of breath or wheezing. Use before exercise --RESTART Pulmonary Rehab --Continue regular aerobic exercise  Chronic hypoxemic respiratory failure Exertional hypoxemia --Uses oxygen with pulmonary rehab only if needed --CONTINUE supplemental oxygen as needed for goal SpO2 >88%  Health Maintenance  There is no immunization history on file for this patient. CT Lung Screen - never smoker. Not qualified.  No orders of the defined types were placed in this encounter.  Meds ordered this encounter  Medications   BREZTRI AEROSPHERE 160-9-4.8 MCG/ACT AERO    Sig: Inhale 2 puffs into the lungs in the morning and at bedtime.    Dispense:  10.7 g    Refill:  5    Return in about 6 months (around 01/25/2023).  I have spent a total time of 31-minutes on the day of the appointment including chart review, data review, collecting history, coordinating care and discussing medical diagnosis and plan with the patient/family. Past medical history, allergies, medications were reviewed. Pertinent  imaging, labs and tests included in this note have been reviewed and interpreted independently by me.  [place], MD [place] [facility] 07/27/2022  Office Number [phone]

## 2022-07-31 ENCOUNTER — Ambulatory Visit: Payer: HMO

## 2022-08-07 ENCOUNTER — Encounter: Payer: PPO | Attending: Pulmonary Disease | Admitting: *Deleted

## 2022-08-07 DIAGNOSIS — J449 Chronic obstructive pulmonary disease, unspecified: Secondary | ICD-10-CM | POA: Diagnosis present

## 2022-08-07 NOTE — Progress Notes (Signed)
Daily Session Note  Patient Details  Name: Ruth White MRN: [id number] Date of Birth: [birth year] Referring Provider:   Flowsheet Row Pulmonary Rehab from 02/26/2022 in [facility] Cardiac and Pulmonary Rehab  Referring Provider Margaretha Seeds MD       Encounter Date: 08/07/2022  Check In:  Session Check In - 08/07/22 0759       Check-In   Supervising physician immediately available to respond to emergencies See telemetry face sheet for immediately available ER MD    Location [facility]-Cardiac & Pulmonary Rehab    Staff Present Nyoka Cowden, RN, BSN, Melina Schools, MS, ACSM CEP, Exercise Physiologist    Virtual Visit No    Fall or balance concerns reported    No    Tobacco Cessation No Change    Warm-up and Cool-down Performed on first and last piece of equipment    Resistance Training Performed Yes    VAD Patient? No    PAD/SET Patient? No      Pain Assessment   Currently in Pain? No/denies    Multiple Pain Sites No                Social History   Tobacco Use  Smoking Status Never  Smokeless Tobacco Never    Goals Met:  Independence with exercise equipment Exercise tolerated well No report of concerns or symptoms today  Goals Unmet:  Not Applicable  Comments: Pt able to follow exercise prescription today without complaint.  Will continue to monitor for progression.    Dr. Emily Filbert is Medical Director for [facility].  Dr. Ottie Glazier is Medical Director for [facility] Pulmonary Rehabilitation.

## 2022-08-07 NOTE — Progress Notes (Signed)
Daily Session Note  Patient Details  Name: Ruth White MRN: [id number] Date of Birth: January 28, 1961 Referring Provider:   Flowsheet Row Pulmonary Rehab from 02/26/2022 in [facility] Cardiac and Pulmonary Rehab  Referring Provider Margaretha Seeds MD       Encounter Date: 08/07/2022  Check In:      Social History   Tobacco Use  Smoking Status Never  Smokeless Tobacco Never    Goals Met:  Independence with exercise equipment Exercise tolerated well No report of concerns or symptoms today  Goals Unmet:  Not Applicable  Comments: Pt able to follow exercise prescription today without complaint.  Will continue to monitor for progression.    Dr. Emily Filbert is Medical Director for [facility].  Dr. Ottie Glazier is Medical Director for [facility] Pulmonary Rehabilitation.

## 2022-08-08 ENCOUNTER — Encounter: Payer: Self-pay | Admitting: *Deleted

## 2022-08-08 DIAGNOSIS — J449 Chronic obstructive pulmonary disease, unspecified: Secondary | ICD-10-CM

## 2022-08-08 NOTE — Progress Notes (Signed)
Pulmonary Individual Treatment Plan  Patient Details  Name: Ruth White MRN: [id number] Date of Birth: Nov 06, 1960 Referring Provider:   Flowsheet Row Pulmonary Rehab from 02/26/2022 in [facility] Cardiac and Pulmonary Rehab  Referring Provider Margaretha Seeds MD       Initial Encounter Date:  Flowsheet Row Pulmonary Rehab from 02/26/2022 in [facility] Cardiac and Pulmonary Rehab  Date 02/26/22       Visit Diagnosis: Stage 3 severe COPD by GOLD classification ([place])  Patient's Home Medications on Admission:  Current Outpatient Medications:    ACCU-CHEK GUIDE test strip, 1 (ONE) EACH DAILY AND AS NEEDED FOR SYMPTOMS, Disp: , Rfl:    Accu-Chek Softclix Lancets lancets, USE 1 LANCET DAILY AS DIRECTED, Disp: , Rfl:    anastrozole (ARIMIDEX) 1 MG tablet, TAKE 1 TABLET BY MOUTH EVERY DAY TO PREVENT FUTURE DISEASE RECURRENCE, Disp: 90 tablet, Rfl: 3   aspirin 325 MG EC tablet, Take 325 mg by mouth daily., Disp: , Rfl:    atenolol (TENORMIN) 50 MG tablet, Take 50 mg by mouth daily., Disp: , Rfl:    BD PEN NEEDLE NANO 2ND GEN 32G X 4 MM MISC, 1 (ONE) PEN NEEDLE USE DAILY, Disp: , Rfl:    Blood Glucose Monitoring Suppl (ACCU-CHEK GUIDE ME) w/Device KIT, 1 (ONE) KIT CHECK BLOOD GLUCOSE DAILY, Disp: , Rfl:    BREZTRI AEROSPHERE 160-9-4.8 MCG/ACT AERO, Inhale 2 puffs into the lungs in the morning and at bedtime., Disp: 10.7 g, Rfl: 5   cyclobenzaprine (FLEXERIL) 10 MG tablet, Take 1 tablet by mouth 2 (two) times daily as needed., Disp: , Rfl:    diclofenac Sodium (VOLTAREN) 1 % GEL, Apply topically., Disp: , Rfl:    Evolocumab (REPATHA SURECLICK) [id number] MG/ML SOAJ, Inject 1 mL into the skin every 14 (fourteen) days., Disp: 2 mL, Rfl: 11   ezetimibe (ZETIA) 10 MG tablet, Take 10 mg by mouth at bedtime., Disp: , Rfl:    FARXIGA 10 MG TABS tablet, Take 10 mg by mouth every morning., Disp: , Rfl:    glucose blood (ACCU-CHEK GUIDE) test strip, 1 (ONE) EACH DAILY AND AS NEEDED FOR SYMPTOMS, Disp: , Rfl:     ibuprofen (ADVIL) 800 MG tablet, Take 800 mg by mouth every 8 (eight) hours as needed., Disp: , Rfl:    lactulose, encephalopathy, (CHRONULAC) 10 GM/15ML SOLN, Take 15 mLs (10 g total) by mouth daily., Disp: 473 mL, Rfl: 2   lisinopril (ZESTRIL) 20 MG tablet, Take 20 mg by mouth 2 (two) times daily., Disp: , Rfl:    magic mouthwash SOLN, Take 5 mLs by mouth., Disp: , Rfl:    metFORMIN (GLUCOPHAGE-XR) 500 MG 24 hr tablet, Take 500 mg by mouth daily., Disp: , Rfl:    naloxone (NARCAN) nasal spray 4 mg/0.1 mL, Use as directed for overdose of narcotic medication, Disp: 1 each, Rfl: 2   NUCYNTA 50 MG tablet, Take 50 mg by mouth 4 (four) times daily., Disp: , Rfl:    ondansetron (ZOFRAN) 4 MG tablet, Take 4 mg by mouth every 8 (eight) hours as needed for nausea or vomiting., Disp: , Rfl:    Oxycodone HCl 10 MG TABS, Take 0.5 tablets (5 mg total) by mouth every 4 (four) hours as needed., Disp: 120 tablet, Rfl: 0   pregabalin (LYRICA) 75 MG capsule, TAKE 1 CAPSULE BY MOUTH TWICE A DAY, Disp: 180 capsule, Rfl: 0   prochlorperazine (COMPAZINE) 10 MG tablet, Take 1 tablet (10 mg total) by mouth every 6 (six)  hours as needed for nausea or vomiting., Disp: 30 tablet, Rfl: 1   rosuvastatin (CRESTOR) 40 MG tablet, Take 40 mg by mouth daily., Disp: , Rfl:    senna (SENOKOT) 8.6 MG tablet, Take 1 tablet by mouth daily. 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, left ([place])     Personal history of chemotherapy    Personal history of radiation therapy     Tobacco Use: Social History   Tobacco Use  Smoking Status Never  Smokeless Tobacco Never    Labs: Review Flowsheet        No data to display           Pulmonary Assessment Scores:  Pulmonary Assessment Scores     Row Name 02/26/22 1205         ADL UCSD   ADL Phase Entry     SOB Score total 106     Rest 1     Walk 4     Stairs 5     Bath 5     Dress 5     Shop 5       CAT Score   CAT Score 26       mMRC Score   mMRC Score 4              UCSD: Self-administered rating of dyspnea associated with activities of daily living (ADLs) 6-point scale (0 = "not at all" to 5 = "maximal or unable to do because of breathlessness")  Scoring Scores range from 0 to 120.  Minimally important difference is 5 units  CAT: CAT can identify the health impairment of COPD patients and is better correlated with disease progression.  CAT has a scoring range of zero to 40. The CAT score is classified into four groups of low (less than 10), medium (10 - 20), high (21-30) and very high (31-40) based on the impact level of disease on health status. A CAT score over 10 suggests significant symptoms.  A worsening CAT score could be explained by an exacerbation, poor medication adherence, poor inhaler technique, or progression of COPD or comorbid conditions.  CAT MCID is 2 points  mMRC: mMRC (Modified Medical Research Council) Dyspnea Scale is used to assess the degree of baseline functional disability in patients of respiratory disease due to dyspnea. No minimal important difference is established. A decrease in score of 1 point or greater is considered a positive change.   Pulmonary Function Assessment:  Pulmonary Function Assessment - 02/26/22 1205       Breath   Shortness of Breath Yes;Panic with Shortness of Breath;Fear of Shortness of Breath;Limiting activity             Exercise Target  Goals: Exercise Program Goal: Individual exercise prescription set using results from initial 6 min walk test and THRR while considering  patient's activity barriers and  safety.   Exercise Prescription Goal: Initial exercise prescription builds to 30-45 minutes a day of aerobic activity, 2-3 days per week.  Home exercise guidelines will be given to patient during program as part of exercise prescription that the participant will acknowledge.  Education: Aerobic Exercise: - Group verbal and visual presentation on the components of exercise prescription. Introduces F.I.T.T principle from ACSM for exercise prescriptions.  Reviews F.I.T.T. principles of aerobic exercise including progression. Written material given at graduation.   Education: Resistance Exercise: - Group verbal and visual presentation on the components of exercise prescription. Introduces F.I.T.T principle from ACSM for exercise prescriptions  Reviews F.I.T.T. principles of resistance exercise including progression. Written material given at graduation. Flowsheet Row Pulmonary Rehab from 05/31/2022 in [facility] Cardiac and Pulmonary Rehab  Date 03/01/22  Educator NT  Instruction Review Code 1- [employer] Understanding        Education: Exercise & Equipment Safety: - Individual verbal instruction and demonstration of equipment use and safety with use of the equipment. Flowsheet Row Pulmonary Rehab from 05/31/2022 in [facility] Cardiac and Pulmonary Rehab  Date 02/26/22  Educator [facility]  Instruction Review Code 1- Verbalizes Understanding       Education: Exercise Physiology & General Exercise Guidelines: - Group verbal and written instruction with models to review the exercise physiology of the cardiovascular system and associated critical values. Provides general exercise guidelines with specific guidelines to those with heart or lung disease.  Flowsheet Row Pulmonary Rehab from 05/31/2022 in [facility] Cardiac and Pulmonary Rehab  Date  04/19/22  Educator [facility]  Instruction Review Code 1- Verbalizes Understanding       Education: Flexibility, Balance, Mind/Body Relaxation: - Group verbal and visual presentation with interactive activity on the components of exercise prescription. Introduces F.I.T.T principle from ACSM for exercise prescriptions. Reviews F.I.T.T. principles of flexibility and balance exercise training including progression. Also discusses the mind body connection.  Reviews various relaxation techniques to help reduce and manage stress (i.e. Deep breathing, progressive muscle relaxation, and visualization). Balance handout provided to take home. Written material given at graduation. Flowsheet Row Pulmonary Rehab from 05/31/2022 in [facility] Cardiac and Pulmonary Rehab  Date 03/01/22  Educator NT  Instruction Review Code 1- Verbalizes Understanding       Activity Barriers & Risk Stratification:  Activity Barriers & Cardiac Risk Stratification - 02/26/22 0745       Activity Barriers & Cardiac Risk Stratification   Activity Barriers Arthritis;Muscular Weakness;Deconditioning;Balance Concerns;Shortness of Breath;History of Falls;Joint Problems;Other (comment);Neck/Spine Problems    Comments neuropathy in hands and feet, shots in both shoulders (hx shoulder fracture)             6 Minute Walk:  6 Minute Walk     Row Name 02/26/22 1157         6 Minute Walk   Phase Initial     Distance 500 feet     Walk Time 5.5 minutes     # of Rest Breaks 2  15 sec, 15 sec     MPH 1.03     METS 1.79     RPE 14     Perceived Dyspnea  2     VO2 Peak 6.26     Symptoms Yes (comment)     Comments SOB, fatigue, Chronic knees (8/10), hips (8/10), and feet (9/10) pain     Resting HR 84 bpm     Resting BP 126/60     Resting Oxygen Saturation  94 %     Exercise Oxygen  Saturation  during 6 min walk 95 %     Max Ex. HR 113 bpm     Max Ex. BP 124/70     2 Minute Post BP 124/68       Interval HR   1 Minute HR 105      2 Minute HR 106     3 Minute HR 109     4 Minute HR 109     5 Minute HR 105     6 Minute HR 113     2 Minute Post HR 108     Interval Heart Rate? Yes       Interval Oxygen   Interval Oxygen? Yes     Baseline Oxygen Saturation % 84 %  Room Air     1 Minute Oxygen Saturation % 95 %     1 Minute Liters of Oxygen 2 L  continuous     2 Minute Oxygen Saturation % 97 %     2 Minute Liters of Oxygen 2 L     3 Minute Oxygen Saturation % 96 %     3 Minute Liters of Oxygen 2 L     4 Minute Oxygen Saturation % 96 %     4 Minute Liters of Oxygen 2 L     5 Minute Oxygen Saturation % 95 %     5 Minute Liters of Oxygen 2 L     6 Minute Oxygen Saturation % 96 %     6 Minute Liters of Oxygen 2 L     2 Minute Post Oxygen Saturation % 96 %     2 Minute Post Liters of Oxygen 2 L             Oxygen Initial Assessment:  Oxygen Initial Assessment - 02/26/22 0753       Home Oxygen   Home Oxygen Device E-Tanks;Home Concentrator    Sleep Oxygen Prescription Continuous    Liters per minute 2    Home Exercise Oxygen Prescription Continuous    Liters per minute 2    Home Resting Oxygen Prescription None    Compliance with Home Oxygen Use Yes      Initial 6 min Walk   Oxygen Used Continuous;E-Tanks    Liters per minute 2      Program Oxygen Prescription   Program Oxygen Prescription Continuous;E-Tanks    Liters per minute 2      Intervention   Short Term Goals To learn and exhibit compliance with exercise, home and travel O2 prescription;To learn and understand importance of monitoring SPO2 with pulse oximeter and demonstrate accurate use of the pulse oximeter.;To learn and understand importance of maintaining oxygen saturations>88%;To learn and demonstrate proper pursed lip breathing techniques or other breathing techniques. ;To learn and demonstrate proper use of respiratory medications    Long  Term Goals Exhibits compliance with exercise, home  and travel O2 prescription;Verbalizes  importance of monitoring SPO2 with pulse oximeter and return demonstration;Compliance with respiratory medication;Maintenance of O2 saturations>88%;Exhibits proper breathing techniques, such as pursed lip breathing or other method taught during program session;Demonstrates proper use of MDI's             Oxygen Re-Evaluation:  Oxygen Re-Evaluation     Row Name 03/01/22 0932 04/05/22 0817 05/01/22 0756 05/31/22 0809       Program Oxygen Prescription   Program Oxygen Prescription Continuous;E-Tanks Continuous;E-Tanks Continuous;E-Tanks Continuous;E-Tanks    Liters per minute 2 -- 2 2  Home Oxygen   Home Oxygen Device E-Tanks;Home Concentrator E-Tanks;Home Concentrator E-Tanks;Home Concentrator E-Tanks;Home Concentrator    Sleep Oxygen Prescription Continuous Continuous Continuous Continuous    Liters per minute 2 2 2 2    $ Home Exercise Oxygen Prescription Continuous Continuous Continuous Continuous    Liters per minute 2 2 2 2    $ Home Resting Oxygen Prescription None None None None    Compliance with Home Oxygen Use Yes Yes Yes Yes      Goals/Expected Outcomes   Short Term Goals To learn and demonstrate proper pursed lip breathing techniques or other breathing techniques.  To learn and demonstrate proper pursed lip breathing techniques or other breathing techniques. ;To learn and exhibit compliance with exercise, home and travel O2 prescription;To learn and understand importance of monitoring SPO2 with pulse oximeter and demonstrate accurate use of the pulse oximeter.;To learn and understand importance of maintaining oxygen saturations>88%;To learn and demonstrate proper use of respiratory medications To learn and demonstrate proper pursed lip breathing techniques or other breathing techniques. ;To learn and exhibit compliance with exercise, home and travel O2 prescription;To learn and understand importance of monitoring SPO2 with pulse oximeter and demonstrate accurate use of the pulse  oximeter.;To learn and understand importance of maintaining oxygen saturations>88%;To learn and demonstrate proper use of respiratory medications To learn and demonstrate proper pursed lip breathing techniques or other breathing techniques. ;To learn and exhibit compliance with exercise, home and travel O2 prescription;To learn and understand importance of monitoring SPO2 with pulse oximeter and demonstrate accurate use of the pulse oximeter.;To learn and understand importance of maintaining oxygen saturations>88%;To learn and demonstrate proper use of respiratory medications    Long  Term Goals Exhibits proper breathing techniques, such as pursed lip breathing or other method taught during program session Exhibits proper breathing techniques, such as pursed lip breathing or other method taught during program session;Maintenance of O2 saturations>88%;Exhibits compliance with exercise, home  and travel O2 prescription;Compliance with respiratory medication;Verbalizes importance of monitoring SPO2 with pulse oximeter and return demonstration;Demonstrates proper use of MDI's Exhibits proper breathing techniques, such as pursed lip breathing or other method taught during program session;Maintenance of O2 saturations>88%;Exhibits compliance with exercise, home  and travel O2 prescription;Compliance with respiratory medication;Verbalizes importance of monitoring SPO2 with pulse oximeter and return demonstration;Demonstrates proper use of MDI's Exhibits proper breathing techniques, such as pursed lip breathing or other method taught during program session;Maintenance of O2 saturations>88%;Exhibits compliance with exercise, home  and travel O2 prescription;Compliance with respiratory medication;Verbalizes importance of monitoring SPO2 with pulse oximeter and return demonstration;Demonstrates proper use of MDI's    Comments Reviewed PLB technique with pt.  Talked about how it works and it's importance in maintaining their  exercise saturations. Ruth White is doing well in rehab.  She is using her oxygen regularly and staying compliant.  She is watching her saturations as well.  She has gotten better about using her PLB. Ruth White is doing well, she did miss some time after a fall.  She is good about using her PLB and practices it routinely at home.  She is good about wearing her oxygen for activity.  She continues to keep an eye on her saturations as well. Ruth White is doing well with her breathing.  She is compliant with her PLB and oxygen therapy.  She continues to keep an eye on her saturations at home.  While exercising this week at home she did drop to 91% on the bike.    Goals/Expected Outcomes Short: Become more profiecient at  using PLB.   Long: Become independent at using PLB. Short: Conitnue to work on PLB  Long: Continue to improve breathing Short: Conitneu to work on PLB Long: Conitnue to monitor lungs short; Continue to use PLB  Long; Continue to manage pulmonary disease             Oxygen Discharge (Final Oxygen Re-Evaluation):  Oxygen Re-Evaluation - 05/31/22 0809       Program Oxygen Prescription   Program Oxygen Prescription Continuous;E-Tanks    Liters per minute 2      Home Oxygen   Home Oxygen Device E-Tanks;Home Concentrator    Sleep Oxygen Prescription Continuous    Liters per minute 2    Home Exercise Oxygen Prescription Continuous    Liters per minute 2    Home Resting Oxygen Prescription None    Compliance with Home Oxygen Use Yes      Goals/Expected Outcomes   Short Term Goals To learn and demonstrate proper pursed lip breathing techniques or other breathing techniques. ;To learn and exhibit compliance with exercise, home and travel O2 prescription;To learn and understand importance of monitoring SPO2 with pulse oximeter and demonstrate accurate use of the pulse oximeter.;To learn and understand importance of maintaining oxygen saturations>88%;To learn and demonstrate proper use of respiratory  medications    Long  Term Goals Exhibits proper breathing techniques, such as pursed lip breathing or other method taught during program session;Maintenance of O2 saturations>88%;Exhibits compliance with exercise, home  and travel O2 prescription;Compliance with respiratory medication;Verbalizes importance of monitoring SPO2 with pulse oximeter and return demonstration;Demonstrates proper use of MDI's    Comments Ruth White is doing well with her breathing.  She is compliant with her PLB and oxygen therapy.  She continues to keep an eye on her saturations at home.  While exercising this week at home she did drop to 91% on the bike.    Goals/Expected Outcomes short; Continue to use PLB  Long; Continue to manage pulmonary disease             Initial Exercise Prescription:  Initial Exercise Prescription - 02/26/22 1100       Date of Initial Exercise RX and Referring Provider   Date 02/26/22    Referring Provider Margaretha Seeds MD      Oxygen   Oxygen Continuous    Liters 2    Maintain Oxygen Saturation 88% or higher      Treadmill   MPH 0.8    Grade 0    Minutes 15    METs 1.6      Recumbant Bike   Level 1    RPM 50    Watts 10    Minutes 15    METs 1.5      NuStep   Level 1    SPM 60    Minutes 15    METs 1.5      T5 Nustep   Level 1    SPM 60    Minutes 15    METs 1.5      Biostep-RELP   Level 1    SPM 40    Minutes 15    METs 2      Track   Laps 14    Minutes 15    METs 1.76      Prescription Details   Frequency (times per week) 2    Duration Progress to 30 minutes of continuous aerobic without signs/symptoms of physical distress      Intensity  THRR 40-80% of Max Heartrate 114-145    Ratings of Perceived Exertion 11-13    Perceived Dyspnea 0-4      Progression   Progression Continue to progress workloads to maintain intensity without signs/symptoms of physical distress.      Resistance Training   Training Prescription Yes    Weight 2 lb     Reps 10-15             Perform Capillary Blood Glucose checks as needed.  Exercise Prescription Changes:   Exercise Prescription Changes     Row Name 02/26/22 1100 03/27/22 1600 04/05/22 0800 04/10/22 1300 04/25/22 1000     Response to Exercise   Blood Pressure (Admit) 126/60 118/62 -- 116/64 98/60   Blood Pressure (Exercise) 124/70 124/68 -- 136/64 114/64   Blood Pressure (Exit) 128/66 98/60 -- 122/60 94/62   Heart Rate (Admit) 84 bpm 94 bpm -- 83 bpm 74 bpm   Heart Rate (Exercise) 113 bpm 102 bpm -- 112 bpm 109 bpm   Heart Rate (Exit) 91 bpm 94 bpm -- 95 bpm 89 bpm   Oxygen Saturation (Admit) 94 % 97 % -- 97 % 99 %   Oxygen Saturation (Exercise) 95 % 94 % -- 92 % 96 %   Oxygen Saturation (Exit) 96 % 97 % -- 97 % 97 %   Rating of Perceived Exertion (Exercise) 14 15 -- 13 15   Perceived Dyspnea (Exercise) 2 2 -- 2 3   Symptoms SOB, fatigue, chronic pain hips, knees, feet (8-9/10) SOB, fatigue -- SOB, fatigue SOB   Comments walk test results 3rd full session of exercise -- -- --   Duration -- Progress to 30 minutes of  aerobic without signs/symptoms of physical distress -- Progress to 30 minutes of  aerobic without signs/symptoms of physical distress Continue with 30 min of aerobic exercise without signs/symptoms of physical distress.   Intensity -- THRR unchanged -- THRR unchanged THRR unchanged     Progression   Progression -- Continue to progress workloads to maintain intensity without signs/symptoms of physical distress. -- Continue to progress workloads to maintain intensity without signs/symptoms of physical distress. Continue to progress workloads to maintain intensity without signs/symptoms of physical distress.   Average METs -- 1.8 -- 2.21 2.5     Resistance Training   Training Prescription -- Yes -- Yes Yes   Weight -- 3 lb -- 3 lb 3 lb   Reps -- 10-15 -- 10-15 10-15     Interval Training   Interval Training -- No -- No No     Oxygen   Oxygen -- Continuous --  Continuous Continuous   Liters -- 2 -- 2 2     Recumbant Bike   Level -- 1 -- 1 --   Watts -- 13 -- 19 --   Minutes -- 15 -- 15 --   METs -- 2 -- 2.73 --     NuStep   Level -- 1 -- 1 3   Minutes -- 80 -- 15 15   METs -- -- -- -- 2.8     T5 Nustep   Level -- 1 -- 1 --   Minutes -- 15 -- 15 --   METs -- 1.8 -- 1.9 --     Biostep-RELP   Level -- -- -- 1 2   SPM -- -- -- -- 50   Minutes -- -- -- 15 15   METs -- -- -- 2 2     Track  Laps -- -- -- -- 11   Minutes -- -- -- -- 15   METs -- -- -- -- 1.6     Home Exercise Plan   Plans to continue exercise at -- -- Home (comment)  walking, recumbent bike, bands, weights Home (comment)  walking, recumbent bike, bands, weights Home (comment)  walking, recumbent bike, bands, weights   Frequency -- -- Add 2 additional days to program exercise sessions. Add 2 additional days to program exercise sessions. Add 2 additional days to program exercise sessions.   Initial Home Exercises Provided -- -- 04/05/22 04/05/22 04/05/22     Oxygen   Maintain Oxygen Saturation -- 88% or higher -- 88% or higher 88% or higher    Row Name 05/08/22 1300 05/22/22 1400 06/05/22 1400         Response to Exercise   Blood Pressure (Admit) 112/74 112/64 102/60     Blood Pressure (Exit) 122/72 102/60 112/64     Heart Rate (Admit) 89 bpm 78 bpm 109 bpm     Heart Rate (Exercise) 102 bpm 107 bpm 140 bpm     Heart Rate (Exit) 94 bpm 82 bpm 116 bpm     Oxygen Saturation (Admit) 99 % 98 % 98 %     Oxygen Saturation (Exercise) 96 % 97 % 96 %     Oxygen Saturation (Exit) 98 % 98 % 97 %     Rating of Perceived Exertion (Exercise) 15 15 15     $ Perceived Dyspnea (Exercise) 3 3 3     $ Symptoms SOB SOB SOB     Duration Continue with 30 min of aerobic exercise without signs/symptoms of physical distress. Continue with 30 min of aerobic exercise without signs/symptoms of physical distress. Continue with 30 min of aerobic exercise without signs/symptoms of physical  distress.     Intensity THRR unchanged THRR unchanged THRR unchanged       Progression   Progression Continue to progress workloads to maintain intensity without signs/symptoms of physical distress. Continue to progress workloads to maintain intensity without signs/symptoms of physical distress. Continue to progress workloads to maintain intensity without signs/symptoms of physical distress.     Average METs 2.27 1.88 2.29       Resistance Training   Training Prescription Yes Yes Yes     Weight 2 lb 3 lb 3 lb     Reps 10-15 10-15 10-15       Interval Training   Interval Training -- No No       Oxygen   Oxygen Continuous Continuous Continuous     Liters 2 2 2       $ Recumbant Bike   Level 1 -- 2     Minutes 15 -- 15     METs 2.51 -- 1.52       NuStep   Level 3 3 --     Minutes 30 15 --     METs 2.3 -- --       T5 Nustep   Level 1 -- 2     Minutes 15 -- 15     METs -- -- 1.8       Biostep-RELP   Level 1 1 3     $ Minutes 15 15 15     $ METs 2 2 3       $ Track   Laps -- 10 15     Minutes -- 15 15     METs -- 1.54 1.82  Home Exercise Plan   Plans to continue exercise at Home (comment)  walking, recumbent bike, bands, weights Home (comment)  walking, recumbent bike, bands, weights Home (comment)  walking, recumbent bike, bands, weights     Frequency Add 2 additional days to program exercise sessions. Add 2 additional days to program exercise sessions. Add 2 additional days to program exercise sessions.     Initial Home Exercises Provided 04/05/22 04/05/22 04/05/22       Oxygen   Maintain Oxygen Saturation 88% or higher 88% or higher 88% or higher              Exercise Comments:   Exercise Goals and Review:   Exercise Goals     Row Name 02/26/22 1202             Exercise Goals   Increase Physical Activity Yes       Intervention Provide advice, education, support and counseling about physical activity/exercise needs.;Develop an individualized exercise  prescription for aerobic and resistive training based on initial evaluation findings, risk stratification, comorbidities and participant's personal goals.       Expected Outcomes Short Term: Attend rehab on a regular basis to increase amount of physical activity.;Long Term: Add in home exercise to make exercise part of routine and to increase amount of physical activity.;Long Term: Exercising regularly at least 3-5 days a week.       Increase Strength and Stamina Yes       Intervention Provide advice, education, support and counseling about physical activity/exercise needs.;Develop an individualized exercise prescription for aerobic and resistive training based on initial evaluation findings, risk stratification, comorbidities and participant's personal goals.       Expected Outcomes Short Term: Increase workloads from initial exercise prescription for resistance, speed, and METs.;Short Term: Perform resistance training exercises routinely during rehab and add in resistance training at home;Long Term: Improve cardiorespiratory fitness, muscular endurance and strength as measured by increased METs and functional capacity (6MWT)       Able to understand and use rate of perceived exertion (RPE) scale Yes       Intervention Provide education and explanation on how to use RPE scale       Expected Outcomes Short Term: Able to use RPE daily in rehab to express subjective intensity level;Long Term:  Able to use RPE to guide intensity level when exercising independently       Able to understand and use Dyspnea scale Yes       Intervention Provide education and explanation on how to use Dyspnea scale       Expected Outcomes Short Term: Able to use Dyspnea scale daily in rehab to express subjective sense of shortness of breath during exertion;Long Term: Able to use Dyspnea scale to guide intensity level when exercising independently       Knowledge and understanding of Target Heart Rate Range (THRR) Yes        Intervention Provide education and explanation of THRR including how the numbers were predicted and where they are located for reference       Expected Outcomes Short Term: Able to state/look up THRR;Short Term: Able to use daily as guideline for intensity in rehab;Long Term: Able to use THRR to govern intensity when exercising independently       Able to check pulse independently Yes       Intervention Provide education and demonstration on how to check pulse in carotid and radial arteries.;Review the importance of being able to check  your own pulse for safety during independent exercise       Expected Outcomes Long Term: Able to check pulse independently and accurately;Short Term: Able to explain why pulse checking is important during independent exercise       Understanding of Exercise Prescription Yes       Intervention Provide education, explanation, and written materials on patient's individual exercise prescription       Expected Outcomes Short Term: Able to explain program exercise prescription;Long Term: Able to explain home exercise prescription to exercise independently                Exercise Goals Re-Evaluation :  Exercise Goals Re-Evaluation     Row Name 03/01/22 0934 03/27/22 1544 04/05/22 0810 04/10/22 1402 04/25/22 1107     Exercise Goal Re-Evaluation   Exercise Goals Review Able to understand and use rate of perceived exertion (RPE) scale;Knowledge and understanding of Target Heart Rate Range (THRR);Able to understand and use Dyspnea scale;Understanding of Exercise Prescription Increase Physical Activity;Increase Strength and Stamina;Understanding of Exercise Prescription Increase Physical Activity;Increase Strength and Stamina;Understanding of Exercise Prescription;Able to understand and use rate of perceived exertion (RPE) scale;Knowledge and understanding of Target Heart Rate Range (THRR);Able to understand and use Dyspnea scale;Able to check pulse independently Increase  Physical Activity;Increase Strength and Stamina;Understanding of Exercise Prescription Increase Physical Activity;Increase Strength and Stamina;Understanding of Exercise Prescription   Comments Reviewed RPE and dyspnea scales, THR and program prescription with pt today.  Pt voiced understanding and was given a copy of goals to take home. Ruth White has had a good start to rehab. It is difficult for her to move around as she lacks the strength, it is also difficult for her to perform on the machines. Staff discussed with patient and thought she would benefit more from completing PT first, then coming back to pulmonary rehab. Referral was sent. In the meantime, patient will continue her exercise with [place] until she transfers over. She was able to try the T5 Nustep,  T4 Nustep, and recumbent bike. We will continue to monitor until we hear back from PT. Reviewed home exercise with pt today.  Pt plans to use bands and weights at home for exercise.  She also has a recumbent bike to use at home and walk with husband.  Reviewed THR, pulse, RPE, sign and symptoms, pulse oximetery and when to call 911 or MD.  Also discussed weather considerations and indoor options.  Pt voiced understanding. Ruth White is doing well in rehab. While she has not increased her workloads on any machines yet, she has increased her average MET level to 2.21 METs. She also has tolerated using 3 lb weights for resistance training. We will continue to monitor her progress in the program. Ruth White continues to do well in rehab. She was able to increase her workload to level 3 on the T4 Nustep for part of the time. It was challenging for her but happy she was able to do it. She also walked 11 laps on the track which was her first time walking. We will continue to monitor.   Expected Outcomes Short: Use RPE daily to regulate intensity. Long: Follow program prescription in THR. Short: Continue to follow exercise prescription safetly Long: Build up strength and stamina  SHort; Start to add in exercise at home Long: Continue to build strength Short: Begin to increase workloads on seated machines. Long: Continue to increase strength and stamina. Short: Work on T4 Nustep at level 3 the entire 15 minutes Long: Continue  to increase overall MET level    Row Name 05/01/22 0737 05/08/22 1347 05/22/22 1454 05/31/22 0751 06/05/22 1443     Exercise Goal Re-Evaluation   Exercise Goals Review Increase Physical Activity;Increase Strength and Stamina;Understanding of Exercise Prescription Increase Physical Activity;Increase Strength and Stamina;Understanding of Exercise Prescription Increase Physical Activity;Increase Strength and Stamina;Understanding of Exercise Prescription Increase Physical Activity;Increase Strength and Stamina;Understanding of Exercise Prescription Increase Physical Activity;Increase Strength and Stamina;Understanding of Exercise Prescription   Comments Ruth White missed the last couple of weeks due to family medical concerns and then a fall at home.  Prior to her fall she was using her bike for 20 min each day and 10 min worth of weights each day.  She  has also been working on her breathing too. Ruth White is doing well in rehab since returning from her fall. She was able to tolerate the T4 at level 3 for 30 minutes. She also was able to work at an average overall MET level of 2.27 METs. She did decrease her hand weights from 3 lb to 1 lb hand weights. We will continue to monitor her progress in the program. Ruth White is slowing getting back into the routine after she was out for a week or so. Jetta was able to get 10 laps on the track and hope to see more walking incorporated into her exercise regimen. She is back up to 3 lbs with her handweights and staying consistent at level 3 on the T4 Nustep. She would benefit from increasing her Biostep to level 2. Will continue to monitor. Ruth White is doing well in rehab.  She is riding her recumbent bike at home for 20 min and then weights  and back to bike again.  She is going to take home a set of 3 lb weights to increase.  She continues to improve her strength and stamina.  She stopped doing some of her PT exercises but was encouraged to get back to them again. She wants to be independent in self care so she keeps working. Ruth White is doing well in the program. She recently improved her overall average MET level back up above 2 METs. She has also done well with her seated machines as she improved to level 2 on the T5, level 3 on the biostep, and level 2 on the recumbent bike. She was able to walk up to 15 laps on the track as well. We will continue to monitor her progress in the program.   Expected Outcomes Short: Get back to exercise routine again Long: Continue to improve stamina Short: Continue to try walking more. Long: Continue to increase strength and stamina. Short: Increase to level 2 for Biostep Long: Continue to increase overall MET level Short: Get back to PT exercises for shoulder Long: Continue to improve stamina Short: Continue to push for more laps on the track. Long: Continue to improve strength and stamina.    [place] Name 06/20/22 1127 07/03/22 0846 07/16/22 1543 08/02/22 0938       Exercise Goal Re-Evaluation   Exercise Goals Review Increase Physical Activity;Increase Strength and Stamina;Understanding of Exercise Prescription Increase Physical Activity;Increase Strength and Stamina;Understanding of Exercise Prescription Increase Physical Activity;Increase Strength and Stamina;Understanding of Exercise Prescription --    Comments Ruth White has not attended since last review as her husband had surgery done and is now not able to get transportation to rehab. We will follow up with patient to see when she is able to return and hope to see good attendance thereafter. Ruth White has  not attended since last review as her husband had surgery done and she is now not able to get transportation to rehab. We will follow up with patient to see when  she is able to return and hope to see good attendance thereafter. Ruth White ia waiting to hear from her husband's doctor on whether or not he is cleared to drive so she is able to have stable transportation again. Their follow up appt is this week. Ruth White ia waiting to hear from her husband's doctor on whether or not he is cleared to drive so she is able to have stable transportation again. She reports that she has been doing some exercise at home. We will continue to follow up with her on when she is able to return to the program.    Expected Outcomes Short: Return to rehab when transportation allows Long: Graduate from the [employer] program Short: Return to rehab when transportation allows Long: Graduate from the [employer] Short: Return to rehab when transportation allows Long: Graduate from the [employer] Short: Return to rehab when transportation allows. Long: Graduate from the [employer].             Discharge Exercise Prescription (Final Exercise Prescription Changes):  Exercise Prescription Changes - 06/05/22 1400       Response to Exercise   Blood Pressure (Admit) 102/60    Blood Pressure (Exit) 112/64    Heart Rate (Admit) 109 bpm    Heart Rate (Exercise) 140 bpm    Heart Rate (Exit) 116 bpm    Oxygen Saturation (Admit) 98 %    Oxygen Saturation (Exercise) 96 %    Oxygen Saturation (Exit) 97 %    Rating of Perceived Exertion (Exercise) 15    Perceived Dyspnea (Exercise) 3    Symptoms SOB    Duration Continue with 30 min of aerobic exercise without signs/symptoms of physical distress.    Intensity THRR unchanged      Progression   Progression Continue to progress workloads to maintain intensity without signs/symptoms of physical distress.    Average METs 2.29      Resistance Training   Training Prescription Yes    Weight 3 lb    Reps 10-15      Interval Training   Interval Training No      Oxygen   Oxygen Continuous    Liters 2      Recumbant Bike    Level 2    Minutes 15    METs 1.52      T5 Nustep   Level 2    Minutes 15    METs 1.8      Biostep-RELP   Level 3    Minutes 15    METs 3      Track   Laps 15    Minutes 15    METs 1.82      Home Exercise Plan   Plans to continue exercise at Home (comment)   walking, recumbent bike, bands, weights   Frequency Add 2 additional days to program exercise sessions.    Initial Home Exercises Provided 04/05/22      Oxygen   Maintain Oxygen Saturation 88% or higher             Nutrition:  Target Goals: Understanding of nutrition guidelines, daily intake of sodium <1529m, cholesterol <2036m calories 30% from fat and 7% or less from saturated fats, daily to have 5 or more servings of fruits and vegetables.  Education: All About  Nutrition: -Group instruction provided by verbal, written material, interactive activities, discussions, models, and posters to present general guidelines for heart healthy nutrition including fat, fiber, MyPlate, the role of sodium in heart healthy nutrition, utilization of the nutrition label, and utilization of this knowledge for meal planning. Follow up email sent as well. Written material given at graduation. Flowsheet Row Pulmonary Rehab from 05/31/2022 in [facility]  Date 05/03/22  [05/17/22 Part II]  Educator [facility]  Instruction Review Code 1- Verbalizes Understanding       Biometrics:  Pre Biometrics - 02/26/22 1202       Pre Biometrics   Height 5' 3.5" (1.613 m)    Weight 186 lb 8 oz (84.6 kg)    Waist Circumference 34.5 inches    Hip Circumference 35.5 inches    Waist to Hip Ratio 0.97 %    BMI (Calculated) 32.51    Single Leg Stand 0 seconds              Nutrition Therapy Plan and Nutrition Goals:  Nutrition Therapy & Goals - 03/26/22 0904       Nutrition Therapy   Diet Heart healthy, low Na    Drug/Food Interactions Statins/Certain Fruits    Protein (specify units) 100-105g    Whole Grain Foods  2 servings   Her food intake is limited due to appetite   Fruits and Vegetables 5 servings/day   Her food intake is limited due to appetite   Sodium 2 grams      Personal Nutrition Goals   Nutrition Goal ST: consider adding in a nutritional supplement, practice mechanical eating, eat protein foods first, aim to include fiber, fat, and protein at most meals. LT: Meet energy/protein needs    Comments 62 y.o. F admitted to pulmonary rehab for stage 3 severe COPD by GOLD classification. PMHx breast cancer s/p chemotherapy and radiation as of 2022, HTN, atherosclerosis, T2DM. Relevant medications includes farxiga, metformin, zofran, nucynta, prochlorperazine, crestor, zoloft, cymbalta, tirzepatide, cyclobenzaprine, hydrochlorothiazide, oxycodone, senokot, lyrica, senokot, fluconazole, anastrozole. She reports limiting/avoiding carbohydrates as she is nervous they will turn to fat; discussed how carbohydrates packaged with fiber like fruit, starchy vegetables, and whole grains can contribute energy rich carbohydrates, fiber, as well as vitamins and minerals. She is interested in including these foods again. Since her chemotherapy she has not had any taste, but some of it is coming back such as sweet and salty flavors - she feels almost too well; a hard candy was too sweet for her and she had to spit it out. She reports that her appetite has been lower overall since the chemotherapy: good day she will have two meals and a bad day she may only have one meal like 1/2 cup of cabbage. B: egg white with whole grain toast or egg white with cheese D: Her husband cooks for her. She reports that instead of spaghetti she will use spaghetti squash, taco salad with ground [place], fish on the grill. She reports that she has lost some weight recently, she has been trying to lose; discussed how meeting calorie and protein needs is important. Discussed mechanical eating, how to include more calories with lower volume, eat protein  rich foods first, and consider nutritional supplements. Discussed heart healthy eating, pulmonary MNT, as well as T2DM MNT.      Intervention Plan   Intervention Prescribe, educate and counsel regarding individualized specific dietary modifications aiming towards targeted core components such as weight, hypertension, lipid management, diabetes, heart  failure and other comorbidities.    Expected Outcomes Short Term Goal: Understand basic principles of dietary content, such as calories, fat, sodium, cholesterol and nutrients.;Short Term Goal: A plan has been developed with personal nutrition goals set during dietitian appointment.;Long Term Goal: Adherence to prescribed nutrition plan.             Nutrition Assessments:  MEDIFICTS Score Key: ?70 Need to make dietary changes  40-70 Heart Healthy Diet ? 40 Therapeutic Level Cholesterol Diet  Flowsheet Row Pulmonary Rehab from 02/26/2022 in [facility] Pulmonary Rehab  Picture Your Plate Total Score on Admission 75      Picture Your Plate Scores: [id number] Unhealthy dietary pattern with much room for improvement. 41-50 Dietary pattern unlikely to meet recommendations for good health and room for improvement. 51-60 More healthful dietary pattern, with some room for improvement.  >60 Healthy dietary pattern, although there may be some specific behaviors that could be improved.   Nutrition Goals Re-Evaluation:  Nutrition Goals Re-Evaluation     [place] Name 05/01/22 0752 05/31/22 0758           Goals   Nutrition Goal ST: consider adding in a nutritional supplement, practice mechanical eating, eat protein foods first, aim to include fiber, fat, and protein at most meals. LT: Meet energy/protein needs SHort; Continue mechanical eating until full Long: Continue to get in more protein      Comment [place] is practicing mechanical eating as she knows she needs to eat, but still not feeling hungry.  She is still working on getting in more protein.   Her husband is trying to stay on top of her about it and makes sure she is eating daily. Quanisha is doing well. She is now eating when she is hungry.  She has not been doing her mechanincal eating.  She says that her stomach has been hurting which can be related to her increased stress. We talked about the importance of eating to fuel her system.  She was encouraged to try meal replacement shakes or protein boosters.  She said she tried before and it hurt her stomach.      Expected Outcome SHort; Continue mechanical eating until full Long: Continue to get in more protein Short: Find a source of protein she can drink. Long; Continue to work on eating more               Nutrition Goals Discharge (Final Nutrition Goals Re-Evaluation):  Nutrition Goals Re-Evaluation - 05/31/22 0758       Goals   Nutrition Goal SHort; Continue mechanical eating until full Long: Continue to get in more protein    Comment Ruth White is doing well. She is now eating when she is hungry.  She has not been doing her mechanincal eating.  She says that her stomach has been hurting which can be related to her increased stress. We talked about the importance of eating to fuel her system.  She was encouraged to try meal replacement shakes or protein boosters.  She said she tried before and it hurt her stomach.    Expected Outcome Short: Find a source of protein she can drink. Long; Continue to work on eating more             Psychosocial: Target Goals: Acknowledge presence or absence of significant depression and/or stress, maximize coping skills, provide positive support system. Participant is able to verbalize types and ability to use techniques and skills needed for reducing stress and depression.  Education: Stress, Anxiety, and Depression - Group verbal and visual presentation to define topics covered.  Reviews how body is impacted by stress, anxiety, and depression.  Also discusses healthy ways to reduce stress and to  treat/manage anxiety and depression.  Written material given at graduation. Flowsheet Row Pulmonary Rehab from 05/31/2022 in [facility] Pul[facility]  Date 04/11/22  Educator [facility]  Instruction Review Code 1- [employer] Understanding       Education: Sleep Hygiene -Provides group verbal and written instruction about how sleep can affect your health.  Define sleep hygiene, discuss sleep cycles and impact of sleep habits. Review good sleep hygiene tips.    Initial Review & Psychosocial Screening:  Initial Psych Review & Screening - 02/26/22 0748       Initial Review   Current issues with Current Depression;Current Psychotropic Meds;History of Depression;Current Stress Concerns    Source of Stress Concerns Chronic Illness;Unable to participate in former interests or hobbies;Unable to perform yard/household activities    Comments currently on sertaline, good days and bad days mostly well managed, getting eyes fixed to be able to see again      [place]? Yes   Husband, kids (lives near by)   Comments 4 kids one calls daily , other three drop in      Barriers   Psychosocial barriers to participate in program The patient should benefit from training in stress management and relaxation.;Psychosocial barriers identified (see note)      Screening Interventions   Interventions Encouraged to exercise;To provide support and resources with identified psychosocial needs;Provide feedback about the scores to participant    Expected Outcomes Short Term goal: Utilizing psychosocial counselor, staff and physician to assist with identification of specific Stressors or current issues interfering with healing process. Setting desired goal for each stressor or current issue identified.;Long Term Goal: Stressors or current issues are controlled or eliminated.;Short Term goal: Identification and review with participant of any Quality of Life or Depression concerns found by  scoring the questionnaire.;Long Term goal: The participant improves quality of Life and PHQ9 Scores as seen by post scores and/or verbalization of changes             Quality of Life Scores:  Scores of 19 and below usually indicate a poorer quality of life in these areas.  A difference of  2-3 points is a clinically meaningful difference.  A difference of 2-3 points in the total score of the Quality of Life Index has been associated with significant improvement in overall quality of life, self-image, physical symptoms, and general health in studies assessing change in quality of life.  PHQ-9: Review Flowsheet       05/31/2022 05/01/2022 02/26/2022  Depression screen PHQ 2/9  Decreased Interest 1 1 2  $ Down, Depressed, Hopeless 1 1 1  $ PHQ - 2 Score 2 2 3  $ Altered sleeping 0 0 3  Tired, decreased energy 3 3 3  $ Change in appetite 2 1 2  $ Feeling bad or failure about yourself  1 1 1  $ Trouble concentrating 1 1 3  $ Moving slowly or fidgety/restless 1 1 2  $ Suicidal thoughts 0 0 0  PHQ-9 Score 10 9 17  $ Difficult doing work/chores Somewhat difficult Somewhat difficult Somewhat difficult   Interpretation of Total Score  Total Score Depression Severity:  1-4 = Minimal depression, 5-9 = Mild depression, 10-14 = Moderate depression, 15-19 = Moderately severe depression, 20-27 = Severe depression   Psychosocial Evaluation and  Intervention:   Psychosocial Re-Evaluation:  Psychosocial Re-Evaluation     Row Name 04/05/22 [phone] 05/01/22 0739 05/31/22 0755         Psychosocial Re-Evaluation   Current issues with Current Depression;Current Anxiety/Panic;Current Psychotropic Meds;Current Sleep Concerns;Current Stress Concerns Current Depression;Current Anxiety/Panic;Current Psychotropic Meds;Current Sleep Concerns;Current Stress Concerns Current Depression;Current Anxiety/Panic;Current Psychotropic Meds;Current Sleep Concerns;Current Stress Concerns     Comments Ineisha is doing well in rehab.   She has been taking her meds still and feeling pretty good.  She does not feel perfectly balanced yet, but she is working with her doctor.  She continues to struggle with her sleep as she only gets about 3-4 hrs a night and then wakes up and unable to go to sleep.  She has done melatonin before and has tried 54m and 7.5 mg but not ten yet, so she will try.  She conitnues to work on it, we talked about importance of sleep too. DGetrudehas been doing well up until a fall last week.  Her PHQ has greatly improved from 17 down to 9.  She is feeling better up until her fall.  She is sleeping better and trying to find the good.  She just get frustrated that she is unable to do everything she wants to.  She wants to continue to build up her strength and stamina.  She is feeling like her mood is balanced DYaslynis doing well in rehab.  She saw her doctor yesterday and they increased her depression meds to help her cope better.  Her husband has just been diagnosised with cancer and they are now undergoing lots of appts and blood work [facility].  He will have surgery in January and they hope to remove it.  She is sleeping good despite getting up in the night at eat.  Her PHQ has increased slightly, but she just had a med increase and has a lot of stress with her husband recently     Expected Outcomes -- Short: Get back to routine exercise again Long: Continue to exercise to build stamina. Short; Conitnue to cope with husbands diagnosis Long: Conitnue to exercise for mental boost     Interventions Stress management education;Encouraged to attend Pulmonary Rehabilitation for the exercise Stress management education;Encouraged to attend Pulmonary Rehabilitation for the exercise Stress management education;Encouraged to attend Pulmonary Rehabilitation for the exercise     Continue Psychosocial Services  Follow up required by staff Follow up required by staff Follow up required by staff               Psychosocial Discharge (Final Psychosocial Re-Evaluation):  Psychosocial Re-Evaluation - 05/31/22 0755       Psychosocial Re-Evaluation   Current issues with Current Depression;Current Anxiety/Panic;Current Psychotropic Meds;Current Sleep Concerns;Current Stress Concerns    Comments Ruth White doing well in rehab.  She saw her doctor yesterday and they increased her depression meds to help her cope better.  Her husband has just been diagnosised with cancer and they are now undergoing lots of appts and blood work to [facility].  He will have surgery in January and they hope to remove it.  She is sleeping good despite getting up in the night at eat.  Her PHQ has increased slightly, but she just had a med increase and has a lot of stress with her husband recently    Expected Outcomes Short; Conitnue to cope with husbands diagnosis Long: Conitnue to exercise for mental boost    Interventions Stress management  education;Encouraged to attend Pulmonary Rehabilitation for the exercise    Continue Psychosocial Services  Follow up required by staff             Education: Education Goals: Education classes will be provided on a weekly basis, covering required topics. Participant will state understanding/return demonstration of topics presented.  Learning Barriers/Preferences:  Learning Barriers/Preferences - 02/26/22 0747       Learning Barriers/Preferences   Learning Barriers Sight   eye lids drooping   Learning Preferences Skilled Demonstration;Verbal Instruction             General Pulmonary Education Topics:  Infection Prevention: - Provides verbal and written material to individual with discussion of infection control including proper hand washing and proper equipment cleaning during exercise session. Flowsheet Row Pulmonary Rehab from 05/31/2022 in [facility] Cardiac and Pulmonary Rehab  Date 02/26/22  Educator [facility]  Instruction Review Code 1- Verbalizes Understanding        Falls Prevention: - Provides verbal and written material to individual with discussion of falls prevention and safety. Flowsheet Row Pulmonary Rehab from 05/31/2022 in [facility] Cardiac and Pulmonary Rehab  Date 02/26/22  Educator [facility]  Instruction Review Code 1- Verbalizes Understanding       Chronic Lung Disease Review: - Group verbal instruction with posters, models, PowerPoint presentations and videos,  to review new updates, new respiratory medications, new advancements in procedures and treatments. Providing information on websites and "800" numbers for continued self-education. Includes information about supplement oxygen, available portable oxygen systems, continuous and intermittent flow rates, oxygen safety, concentrators, and Medicare reimbursement for oxygen. Explanation of Pulmonary Drugs, including class, frequency, complications, importance of spacers, rinsing mouth after steroid MDI's, and proper cleaning methods for nebulizers. Review of basic lung anatomy and physiology related to function, structure, and complications of lung disease. Review of risk factors. Discussion about methods for diagnosing sleep apnea and types of masks and machines for OSA. Includes a review of the use of types of environmental controls: home humidity, furnaces, filters, dust mite/pet prevention, HEPA vacuums. Discussion about weather changes, air quality and the benefits of nasal washing. Instruction on Warning signs, infection symptoms, calling MD promptly, preventive modes, and value of vaccinations. Review of effective airway clearance, coughing and/or vibration techniques. Emphasizing that all should Create an Action Plan. Written material given at graduation. Flowsheet Row Pulmonary Rehab from 05/31/2022 in [facility] Cardiac and Pulmonary Rehab  Education need identified 02/26/22  Date 04/05/22  Educator [facility]  Instruction Review Code 1- Verbalizes Understanding       AED/CPR: - Group verbal and written  instruction with the use of models to demonstrate the basic use of the AED with the basic ABC's of resuscitation.    Anatomy and Cardiac Procedures: - Group verbal and visual presentation and models provide information about basic cardiac anatomy and function. Reviews the testing methods done to diagnose heart disease and the outcomes of the test results. Describes the treatment choices: Medical Management, Angioplasty, or Coronary Bypass Surgery for treating various heart conditions including Myocardial Infarction, Angina, Valve Disease, and Cardiac Arrhythmias.  Written material given at graduation. Flowsheet Row Pulmonary Rehab from 05/31/2022 in [facility] Cardiac and Pulmonary Rehab  Date 05/24/22  Educator SB  Instruction Review Code 1- Verbalizes Understanding       Medication Safety: - Group verbal and visual instruction to review commonly prescribed medications for heart and lung disease. Reviews the medication, class of the drug, and side effects. Includes the steps to properly store meds and maintain  the prescription regimen.  Written material given at graduation. Flowsheet Row Pulmonary Rehab from 05/24/2022 in [facility] Cardiac and Pulmonary Rehab  Date 03/22/22  Educator SB  Instruction Review Code 1- Verbalizes Understanding       Other: -Provides group and verbal instruction on various topics (see comments)   Knowledge Questionnaire Score:  Knowledge Questionnaire Score - 02/26/22 1204       Knowledge Questionnaire Score   Pre Score 12/18              Core Components/Risk Factors/Patient Goals at Admission:  Personal Goals and Risk Factors at Admission - 02/26/22 0751       Core Components/Risk Factors/Patient Goals on Admission    Weight Management Yes;Weight Loss;Obesity    Intervention Weight Management: Develop a combined nutrition and exercise program designed to reach desired caloric intake, while maintaining appropriate intake of nutrient and fiber, sodium  and fats, and appropriate energy expenditure required for the weight goal.;Weight Management: Provide education and appropriate resources to help participant work on and attain dietary goals.;Weight Management/Obesity: Establish reasonable short term and long term weight goals.;Obesity: Provide education and appropriate resources to help participant work on and attain dietary goals.    Admit Weight 186 lb 8 oz (84.6 kg)    Goal Weight: Short Term 182 lb (82.6 kg)    Goal Weight: Long Term 180 lb (81.6 kg)    Expected Outcomes Short Term: Continue to assess and modify interventions until short term weight is achieved;Long Term: Adherence to nutrition and physical activity/exercise program aimed toward attainment of established weight goal;Weight Loss: Understanding of general recommendations for a balanced deficit meal plan, which promotes 1-2 lb weight loss per week and includes a negative energy balance of [phone] kcal/d;Understanding recommendations for meals to include 15-35% energy as protein, 25-35% energy from fat, 35-60% energy from carbohydrates, less than 263m of dietary cholesterol, 20-35 gm of total fiber daily;Understanding of distribution of calorie intake throughout the day with the consumption of 4-5 meals/snacks    Improve shortness of breath with ADL's Yes    Intervention Provide education, individualized exercise plan and daily activity instruction to help decrease symptoms of SOB with activities of daily living.    Expected Outcomes Short Term: Improve cardiorespiratory fitness to achieve a reduction of symptoms when performing ADLs;Long Term: Be able to perform more ADLs without symptoms or delay the onset of symptoms    Increase knowledge of respiratory medications and ability to use respiratory devices properly  Yes    Intervention Provide education and demonstration as needed of appropriate use of medications, inhalers, and oxygen therapy.    Expected Outcomes Short Term: Achieves  understanding of medications use. Understands that oxygen is a medication prescribed by physician. Demonstrates appropriate use of inhaler and oxygen therapy.;Long Term: Maintain appropriate use of medications, inhalers, and oxygen therapy.    Diabetes Yes    Intervention Provide education about signs/symptoms and action to take for hypo/hyperglycemia.;Provide education about proper nutrition, including hydration, and aerobic/resistive exercise prescription along with prescribed medications to achieve blood glucose in normal ranges: Fasting glucose 65-99 mg/dL    Expected Outcomes Short Term: Participant verbalizes understanding of the signs/symptoms and immediate care of hyper/hypoglycemia, proper foot care and importance of medication, aerobic/resistive exercise and nutrition plan for blood glucose control.;Long Term: Attainment of HbA1C < 7%.    Hypertension Yes    Intervention Provide education on lifestyle modifcations including regular physical activity/exercise, weight management, moderate sodium restriction and increased consumption of fresh fruit,  vegetables, and low fat dairy, alcohol moderation, and smoking cessation.;Monitor prescription use compliance.    Expected Outcomes Short Term: Continued assessment and intervention until BP is < 140/2m HG in hypertensive participants. < 130/861mHG in hypertensive participants with diabetes, heart failure or chronic kidney disease.;Long Term: Maintenance of blood pressure at goal levels.    Lipids Yes    Intervention Provide education and support for participant on nutrition & aerobic/resistive exercise along with prescribed medications to achieve LDL <7030mHDL >46m29m  Expected Outcomes Short Term: Participant states understanding of desired cholesterol values and is compliant with medications prescribed. Participant is following exercise prescription and nutrition guidelines.;Long Term: Cholesterol controlled with medications as prescribed, with  individualized exercise RX and with personalized nutrition plan. Value goals: LDL < 70mg35mL > 40 mg.             Education:Diabetes - Individual verbal and written instruction to review signs/symptoms of diabetes, desired ranges of glucose level fasting, after meals and with exercise. Acknowledge that pre and post exercise glucose checks will be done for 3 sessions at entry of program. Flowsheet Row [facility]  Date 02/26/22  [facility] Code 1- Verbalizes Understanding       Know Your Numbers and Heart Failure: - Group verbal and visual instruction to discuss disease risk factors for cardiac and pulmonary disease and treatment options.  Reviews associated critical values for Overweight/Obesity, Hypertension, Cholesterol, and Diabetes.  Discusses basics of heart failure: signs/symptoms and treatments.  Introduces Heart Failure Zone chart for action plan for heart failure.  Written material given at graduation. Flowsheet Row [facility]  Date 03/29/22  Educator SB  Instruction Review Code 1- Verbalizes Understanding       Core Components/Risk Factors/Patient Goals Review:   Goals and Risk Factor Review     Row Name 04/05/22 0815 05/01/22 0754 05/31/22 0803         Core Components/Risk Factors/Patient Goals Review   Personal Goals Review Weight Management/Obesity;Improve shortness of breath with ADL's;Increase knowledge of respiratory medications and ability to use respiratory devices properly.;Diabetes;Hypertension;Lipids Weight Management/Obesity;Improve shortness of breath with ADL's;Increase knowledge of respiratory medications and ability to use respiratory devices properly.;Diabetes;Hypertension;Lipids Weight Management/Obesity;Improve shortness of breath with ADL's;Increase knowledge of respiratory medications and ability to use respiratory  devices properly.;Diabetes;Hypertension;Lipids     Review Ruth White well in rehab.  Her weight has been trending down.  Her breathing is getting better for most part but still has harder days.  She is doing well with her inhalers and nebulizers.  Her sugars are doing well around 120s in the morning.  Her pressures have been staying good.  She continues to check both at home. Ruth White well in rehab. She missed some time with medical appointments and a fall last week.  She is doing better with her breahing and practices it at home.  She is keeping her weight steady and watching her pressures and sugars at home with her husbands help.  She is good about using her nebulizer and inhalers. Ruth White well in rehab.  DebraShakarraosing weight.  We talked about adding in protein to her diet.  She is finding that she is doing better with her breathing for most part.  She still has some bad breathing days, but it is getting better.  She continues to build her stamina to be  able to do to do more around the house.  Her blood sugars are stable feeling and her Alc was 6.6!!  She was unable to get a new meter on her insurance so her doctor ordered her another meter.   She continues to work to get it down.  Her pressures have been on the lower side but she has not had problems with it.     Expected Outcomes Short: Conintue to work on breathing Long: Conitnue to monitor risk factors Short: Conitnue to work on breathing Long: Conitnue to monitor weight to not lose SHort Get new meter Long: Continue to monitor risk factors              Core Components/Risk Factors/Patient Goals at Discharge (Final Review):   Goals and Risk Factor Review - 05/31/22 0803       Core Components/Risk Factors/Patient Goals Review   Personal Goals Review Weight Management/Obesity;Improve shortness of breath with ADL's;Increase knowledge of respiratory medications and ability to use respiratory devices  properly.;Diabetes;Hypertension;Lipids    Review Quillie is doing well in rehab.  [place] is losing weight.  We talked about adding in protein to her diet.  She is finding that she is doing better with her breathing for most part.  She still has some bad breathing days, but it is getting better.  She continues to build her stamina to be able to do to do more around the house.  Her blood sugars are stable feeling and her Alc was 6.6!!  She was unable to get a new meter on her insurance so her doctor ordered her another meter.   She continues to work to get it down.  Her pressures have been on the lower side but she has not had problems with it.    Expected Outcomes SHort Get new meter Long: Continue to monitor risk factors             ITP Comments:  ITP Comments     Row Name 02/26/22 1155 03/21/22 0723 03/26/22 1439 03/27/22 1545 04/18/22 0935   ITP Comments Completed 6MWT and gym orientation. Initial ITP created and sent for review to Dr. Zetta Bills, Medical Director.  Documentation for diagnosis can be found in [facility] encounter 01/12/22. 30 Day review completed. Medical Director ITP review done, changes made as directed, and signed approval by Medical Director.    New to program Completed initial RD consultation Staff felt that patient would better benefit from going to Physical Therapy first prior to finishing rehab.  She would do better in rehab if she could build up her strength and balance in physical therapy.  Note sent to MD to request PT referral. We will continue with rehab until set up.  Staff spoke with Aryanna and her husband and they both agreed and voiced understanding. 30 Day review completed. Medical Director ITP review done, changes made as directed, and signed approval by Medical Director.    Frances has continued PR and is showing improvement with independence of movement    Row Name 05/01/22 0736 05/16/22 0808 06/04/22 1242 06/13/22 1021 06/19/22 1133   ITP Comments Erianna had a fall off  the toliet last week which kept her out for an extra day. 30 Day review completed. Medical Director ITP review done, changes made as directed, and signed approval by Medical Director. Rogers will be out for several weeks as her husband is getting surgery and she will not have transportation to rehab- she also wants to be there for  for him. We will place patient on hold and contact her after the New Year to see when she will return. 30 Day review completed. Medical Director ITP review done, changes made as directed, and signed approval by Medical Director. Aynsley has been out as she has not had transportation due to her husband's surgery.  He is still not able to drive and currently on narcotics.  He has a follow up next week but still not able to drive and then sit.  Thus, she will not be able to attend due to transportation issues.  We will leave her out on hold until he is able to drive again. She is riding her bike and using her weights at home in the mean time.    [place] Name 07/03/22 1423 08/07/22 0754 08/08/22 1259       ITP Comments Called patient to check in.  She is doing her exercise at home, but her husband still cannot drive.  His next follow up in on 07/20/22.  We have extended her medical hold until then.  She will keep [place] posted on his ability to drive. Rhianon returned today.  She was able to return to her normal exercise routine in rehab. We will follow up with her again once she has regular attendance. 30 day review completed. ITP sent to Dr. Zetta Bills, Medical Director of  Pulmonary Rehab. Continue with ITP unless changes are made by physician.              Comments: 30 day review

## 2022-08-09 ENCOUNTER — Encounter: Payer: PPO | Admitting: *Deleted

## 2022-08-09 DIAGNOSIS — J449 Chronic obstructive pulmonary disease, unspecified: Secondary | ICD-10-CM | POA: Diagnosis not present

## 2022-08-09 NOTE — Progress Notes (Signed)
Daily Session Note  Patient Details  Name: Ruth White MRN: [id number] Date of Birth: October 03, 1960 Referring Provider:   Flowsheet Row Pulmonary Rehab from 02/26/2022 in [facility] Cardiac and Pulmonary Rehab  Referring Provider Margaretha Seeds MD       Encounter Date: 08/09/2022  Check In:  Session Check In - 08/09/22 0759       Check-In   Supervising physician immediately available to respond to emergencies See telemetry face sheet for immediately available ER MD    Location [facility]-Cardiac & Pulmonary Rehab    Staff Present Darlyne Russian, RN, ADN;Jessica Luan Pulling, MA, RCEP, CCRP, Bertram Gala, MS, ACSM CEP, Exercise Physiologist;Joseph Tessie Fass, [place]    Virtual Visit No    Medication changes reported     No    Fall or balance concerns reported    No    Warm-up and Cool-down Performed on first and last piece of equipment    Resistance Training Performed Yes    VAD Patient? No    PAD/SET Patient? No      Pain Assessment   Currently in Pain? No/denies                Social History   Tobacco Use  Smoking Status Never  Smokeless Tobacco Never    Goals Met:  Independence with exercise equipment Exercise tolerated well No report of concerns or symptoms today Strength training completed today  Goals Unmet:  Not Applicable  Comments: Pt able to follow exercise prescription today without complaint.  Will continue to monitor for progression.    Dr. Emily Filbert is Medical Director for [place].  Dr. Ottie Glazier is Medical Director for [facility] Pulmonary Rehabilitation.

## 2022-08-15 ENCOUNTER — Ambulatory Visit
Admission: RE | Admit: 2022-08-15 | Discharge: 2022-08-15 | Disposition: A | Discharge status: Home / Self Care | Payer: PPO | Source: Ambulatory Visit | Attending: Oncology | Admitting: Oncology

## 2022-08-15 ENCOUNTER — Ambulatory Visit: Payer: HMO

## 2022-08-15 ENCOUNTER — Ambulatory Visit
Admission: RE | Admit: 2022-08-15 | Discharge: 2022-08-15 | Disposition: A | Discharge status: Home / Self Care | Payer: HMO | Source: Ambulatory Visit | Attending: Surgery | Admitting: Surgery

## 2022-08-15 DIAGNOSIS — C50012 Malignant neoplasm of nipple and areola, left female breast: Secondary | ICD-10-CM

## 2022-08-15 HISTORY — DX: Malignant neoplasm of unspecified site of unspecified female breast: C50.919

## 2022-08-16 ENCOUNTER — Encounter: Payer: PPO | Admitting: *Deleted

## 2022-08-16 DIAGNOSIS — J449 Chronic obstructive pulmonary disease, unspecified: Secondary | ICD-10-CM

## 2022-08-16 NOTE — Progress Notes (Signed)
Daily Session Note  Patient Details  Name: Ruth White MRN: [id number] Date of Birth: 1960-12-16 Referring Provider:   Flowsheet Row Pulmonary Rehab from 02/26/2022 in [facility] Cardiac and Pulmonary Rehab  Referring Provider Margaretha Seeds MD       Encounter Date: 08/16/2022  Check In:  Session Check In - 08/16/22 0756       Check-In   Supervising physician immediately available to respond to emergencies See telemetry face sheet for immediately available ER MD    Location [facility]-Cardiac & Pulmonary Rehab    Staff Present Darlyne Russian, RN, ADN;Laureen Owens Shark, BS, RRT, CPFT;Jessica [place], MA, RCEP, CCRP, Bertram Gala, MS, ACSM CEP, Exercise Physiologist    Virtual Visit No    Medication changes reported     No    Fall or balance concerns reported    No    Warm-up and Cool-down Performed on first and last piece of equipment    Resistance Training Performed Yes    VAD Patient? No    PAD/SET Patient? No      Pain Assessment   Currently in Pain? No/denies                Social History   Tobacco Use  Smoking Status Never  Smokeless Tobacco Never    Goals Met:  Independence with exercise equipment Exercise tolerated well No report of concerns or symptoms today Strength training completed today  Goals Unmet:  Not Applicable  Comments: Pt able to follow exercise prescription today without complaint.  Will continue to monitor for progression.    Dr. Emily Filbert is Medical Director for [place].  Dr. Ottie Glazier is Medical Director for [facility] Pulmonary Rehabilitation.

## 2022-08-21 ENCOUNTER — Encounter: Payer: PPO | Attending: Pulmonary Disease | Admitting: *Deleted

## 2022-08-21 DIAGNOSIS — Z5189 Encounter for other specified aftercare: Secondary | ICD-10-CM | POA: Insufficient documentation

## 2022-08-21 DIAGNOSIS — J449 Chronic obstructive pulmonary disease, unspecified: Secondary | ICD-10-CM | POA: Diagnosis not present

## 2022-08-21 NOTE — Progress Notes (Signed)
Daily Session Note  Patient Details  Name: Ruth White MRN: [id number] Date of Birth: [birth year] Referring Provider:   Flowsheet Row Pulmonary Rehab from 02/26/2022 in [facility] Cardiac and Pulmonary Rehab  Referring Provider Margaretha Seeds MD       Encounter Date: 08/21/2022  Check In:  Session Check In - 08/21/22 0835       Check-In   Supervising physician immediately available to respond to emergencies See telemetry face sheet for immediately available ER MD    Location [facility]-Cardiac & Pulmonary Rehab    Staff Present Heath Lark, RN, BSN, CCRP;Jessica [place], MA, RCEP, CCRP, Bertram Gala, MS, ACSM CEP, Exercise Physiologist    Virtual Visit No    Medication changes reported     No    Fall or balance concerns reported    No    Warm-up and Cool-down Performed on first and last piece of equipment    Resistance Training Performed Yes    VAD Patient? No    PAD/SET Patient? No      Pain Assessment   Currently in Pain? No/denies                Social History   Tobacco Use  Smoking Status Never  Smokeless Tobacco Never    Goals Met:  Proper associated with RPD/PD & O2 Sat Independence with exercise equipment Exercise tolerated well No report of concerns or symptoms today  Goals Unmet:  Not Applicable  Comments: Pt able to follow exercise prescription today without complaint.  Will continue to monitor for progression.    Dr. Emily Filbert is Medical Director for [place].  Dr. Ottie Glazier is Medical Director for [facility] Pulmonary Rehabilitation.

## 2022-08-23 ENCOUNTER — Encounter: Payer: PPO | Admitting: *Deleted

## 2022-08-23 DIAGNOSIS — J449 Chronic obstructive pulmonary disease, unspecified: Secondary | ICD-10-CM | POA: Diagnosis not present

## 2022-08-23 NOTE — Progress Notes (Signed)
Daily Session Note  Patient Details  Name: Ruth White MRN: [id number] Date of Birth: 07-20-1960 Referring Provider:   Flowsheet Row Pulmonary Rehab from 02/26/2022 in [facility] Cardiac and Pulmonary Rehab  Referring Provider Margaretha Seeds MD       Encounter Date: 08/23/2022  Check In:  Session Check In - 08/23/22 0748       Check-In   Supervising physician immediately available to respond to emergencies See telemetry face sheet for immediately available ER MD    Location [facility]-Cardiac & Pulmonary Rehab    Staff Present Darlyne Russian, RN, ADN;Jessica Luan Pulling, MA, RCEP, CCRP, Bertram Gala, MS, ACSM CEP, Exercise Physiologist    Virtual Visit No    Medication changes reported     No    Fall or balance concerns reported    No    Warm-up and Cool-down Performed on first and last piece of equipment    Resistance Training Performed Yes    VAD Patient? No    PAD/SET Patient? No      Pain Assessment   Currently in Pain? No/denies                Social History   Tobacco Use  Smoking Status Never  Smokeless Tobacco Never    Goals Met:  Independence with exercise equipment Exercise tolerated well No report of concerns or symptoms today Strength training completed today  Goals Unmet:  Not Applicable  Comments: Pt able to follow exercise prescription today without complaint.  Will continue to monitor for progression.    Dr. Emily Filbert is Medical Director for [place].  Dr. Ottie Glazier is Medical Director for [facility] Pulmonary Rehabilitation.

## 2022-09-04 ENCOUNTER — Encounter: Payer: PPO | Admitting: *Deleted

## 2022-09-04 DIAGNOSIS — J449 Chronic obstructive pulmonary disease, unspecified: Secondary | ICD-10-CM

## 2022-09-04 NOTE — Progress Notes (Signed)
Daily Session Note  Patient Details  Name: Ruth White MRN: [id number] Date of Birth: Apr 15, 1961 Referring Provider:   Flowsheet Row Pulmonary Rehab from 02/26/2022 in [facility] Cardiac and Pulmonary Rehab  Referring Provider Margaretha Seeds MD       Encounter Date: 09/04/2022  Check In:  Session Check In - 09/04/22 1032       Check-In   Supervising physician immediately available to respond to emergencies See telemetry face sheet for immediately available ER MD    Location [facility]-Cardiac & Pulmonary Rehab    Staff Present Heath Lark, RN, BSN, CCRP;Jessica [place], MA, RCEP, CCRP, Bertram Gala, MS, ACSM CEP, Exercise Physiologist    Virtual Visit No    Medication changes reported     No    Fall or balance concerns reported    No    Warm-up and Cool-down Performed on first and last piece of equipment    Resistance Training Performed Yes    VAD Patient? No    PAD/SET Patient? No      Pain Assessment   Currently in Pain? No/denies                Social History   Tobacco Use  Smoking Status Never  Smokeless Tobacco Never    Goals Met:  Proper associated with RPD/PD & O2 Sat Independence with exercise equipment Exercise tolerated well No report of concerns or symptoms today  Goals Unmet:  Not Applicable  Comments: Pt able to follow exercise prescription today without complaint.  Will continue to monitor for progression.    Dr. Emily Filbert is Medical Director for [place].  Dr. Ottie Glazier is Medical Director for [facility] Pulmonary Rehabilitation.

## 2022-09-05 ENCOUNTER — Encounter: Payer: Self-pay | Admitting: *Deleted

## 2022-09-05 DIAGNOSIS — J449 Chronic obstructive pulmonary disease, unspecified: Secondary | ICD-10-CM

## 2022-09-05 NOTE — Progress Notes (Signed)
Pulmonary Individual Treatment Plan  Patient Details  Name: Ruth White MRN: [id number] Date of Birth: Nov 06, 1960 Referring Provider:   Flowsheet Row Pulmonary Rehab from 02/26/2022 in [facility] Cardiac and Pulmonary Rehab  Referring Provider Margaretha Seeds MD       Initial Encounter Date:  Flowsheet Row Pulmonary Rehab from 02/26/2022 in [facility] Cardiac and Pulmonary Rehab  Date 02/26/22       Visit Diagnosis: Stage 3 severe COPD by GOLD classification ([place])  Patient's Home Medications on Admission:  Current Outpatient Medications:    ACCU-CHEK GUIDE test strip, 1 (ONE) EACH DAILY AND AS NEEDED FOR SYMPTOMS, Disp: , Rfl:    Accu-Chek Softclix Lancets lancets, USE 1 LANCET DAILY AS DIRECTED, Disp: , Rfl:    anastrozole (ARIMIDEX) 1 MG tablet, TAKE 1 TABLET BY MOUTH EVERY DAY TO PREVENT FUTURE DISEASE RECURRENCE, Disp: 90 tablet, Rfl: 3   aspirin 325 MG EC tablet, Take 325 mg by mouth daily., Disp: , Rfl:    atenolol (TENORMIN) 50 MG tablet, Take 50 mg by mouth daily., Disp: , Rfl:    BD PEN NEEDLE NANO 2ND GEN 32G X 4 MM MISC, 1 (ONE) PEN NEEDLE USE DAILY, Disp: , Rfl:    Blood Glucose Monitoring Suppl (ACCU-CHEK GUIDE ME) w/Device KIT, 1 (ONE) KIT CHECK BLOOD GLUCOSE DAILY, Disp: , Rfl:    BREZTRI AEROSPHERE 160-9-4.8 MCG/ACT AERO, Inhale 2 puffs into the lungs in the morning and at bedtime., Disp: 10.7 g, Rfl: 5   cyclobenzaprine (FLEXERIL) 10 MG tablet, Take 1 tablet by mouth 2 (two) times daily as needed., Disp: , Rfl:    diclofenac Sodium (VOLTAREN) 1 % GEL, Apply topically., Disp: , Rfl:    Evolocumab (REPATHA SURECLICK) [id number] MG/ML SOAJ, Inject 1 mL into the skin every 14 (fourteen) days., Disp: 2 mL, Rfl: 11   ezetimibe (ZETIA) 10 MG tablet, Take 10 mg by mouth at bedtime., Disp: , Rfl:    FARXIGA 10 MG TABS tablet, Take 10 mg by mouth every morning., Disp: , Rfl:    glucose blood (ACCU-CHEK GUIDE) test strip, 1 (ONE) EACH DAILY AND AS NEEDED FOR SYMPTOMS, Disp: , Rfl:     ibuprofen (ADVIL) 800 MG tablet, Take 800 mg by mouth every 8 (eight) hours as needed., Disp: , Rfl:    lactulose, encephalopathy, (CHRONULAC) 10 GM/15ML SOLN, Take 15 mLs (10 g total) by mouth daily., Disp: 473 mL, Rfl: 2   lisinopril (ZESTRIL) 20 MG tablet, Take 20 mg by mouth 2 (two) times daily., Disp: , Rfl:    magic mouthwash SOLN, Take 5 mLs by mouth., Disp: , Rfl:    metFORMIN (GLUCOPHAGE-XR) 500 MG 24 hr tablet, Take 500 mg by mouth daily., Disp: , Rfl:    naloxone (NARCAN) nasal spray 4 mg/0.1 mL, Use as directed for overdose of narcotic medication, Disp: 1 each, Rfl: 2   NUCYNTA 50 MG tablet, Take 50 mg by mouth 4 (four) times daily., Disp: , Rfl:    ondansetron (ZOFRAN) 4 MG tablet, Take 4 mg by mouth every 8 (eight) hours as needed for nausea or vomiting., Disp: , Rfl:    Oxycodone HCl 10 MG TABS, Take 0.5 tablets (5 mg total) by mouth every 4 (four) hours as needed., Disp: 120 tablet, Rfl: 0   pregabalin (LYRICA) 75 MG capsule, TAKE 1 CAPSULE BY MOUTH TWICE A DAY, Disp: 180 capsule, Rfl: 0   prochlorperazine (COMPAZINE) 10 MG tablet, Take 1 tablet (10 mg total) by mouth every 6 (six)  hours as needed for nausea or vomiting., Disp: 30 tablet, Rfl: 1   rosuvastatin (CRESTOR) 40 MG tablet, Take 40 mg by mouth daily., Disp: , Rfl:    senna (SENOKOT) 8.6 MG tablet, Take 1 tablet by mouth daily. 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  Minimally important difference is 5 units  CAT: CAT can identify the health impairment of COPD patients and is better correlated with disease progression.  CAT has a scoring range of zero to 40. The CAT score is classified into four groups of low (less than 10), medium (10 - 20), high (21-30) and very high (31-40) based on the impact level of disease on health status. A CAT score over 10 suggests significant symptoms.  A worsening CAT score could be explained by an exacerbation, poor medication adherence, poor inhaler technique, or progression of COPD or comorbid conditions.  CAT MCID is 2 points  mMRC: mMRC (Modified Medical Research Council) Dyspnea Scale is used to assess the degree of baseline functional disability in patients of respiratory disease due to dyspnea. No minimal important difference is established. A decrease in score of 1 point or greater is considered a positive change.   Pulmonary Function Assessment:   Exercise Target Goals: Exercise Program Goal: Individual exercise prescription set using results from initial 6 min walk test and THRR while considering  patient's activity barriers and safety.   Exercise Prescription Goal: Initial exercise prescription builds to 30-45 minutes a day of aerobic activity, 2-3 days per week.  Home exercise guidelines will be given to patient during program as part of exercise prescription that the participant will  acknowledge.  Education: Aerobic Exercise: - Group verbal and visual presentation on the components of exercise prescription. Introduces F.I.T.T principle from ACSM for exercise prescriptions.  Reviews F.I.T.T. principles of aerobic exercise including progression. Written material given at graduation.   Education: Resistance Exercise: - Group verbal and visual presentation on the components of exercise prescription. Introduces F.I.T.T principle from ACSM for exercise prescriptions  Reviews F.I.T.T. principles of resistance exercise including progression. Written material given at graduation. Flowsheet Row Pulmonary Rehab from 08/16/2022 in [facility] Cardiac and Pulmonary Rehab  Date 03/01/22  Educator NT  Instruction Review  Code 1- Verbalizes Understanding        Education: Exercise & Equipment Safety: - Individual verbal instruction and demonstration of equipment use and safety with use of the equipment. Flowsheet Row Pulmonary Rehab from 08/16/2022 in [facility] Cardiac and Pulmonary Rehab  Date 02/26/22  Educator [facility]  Instruction Review Code 1- Verbalizes Understanding       Education: Exercise Physiology & General Exercise Guidelines: - Group verbal and written instruction with models to review the exercise physiology of the cardiovascular system and associated critical values. Provides general exercise guidelines with specific guidelines to those with heart or lung disease.  Flowsheet Row Pulmonary Rehab from 08/16/2022 in [facility] Cardiac and Pulmonary Rehab  Date 04/19/22  Educator [facility]  Instruction Review Code 1- Verbalizes Understanding       Education: Flexibility, Balance, Mind/Body Relaxation: - Group verbal and visual presentation with interactive activity on the components of exercise prescription. Introduces F.I.T.T principle from ACSM for exercise prescriptions. Reviews F.I.T.T. principles of flexibility and balance exercise training including progression. Also discusses the mind body  connection.  Reviews various relaxation techniques to help reduce and manage stress (i.e. Deep breathing, progressive muscle relaxation, and visualization). Balance handout provided to take home. Written material given at graduation. Flowsheet Row Pulmonary Rehab from 08/16/2022 in [facility] Cardiac and Pulmonary Rehab  Date 03/01/22  Educator NT  Instruction Review Code 1- Verbalizes Understanding       Activity Barriers & Risk Stratification:   6 Minute Walk:  Oxygen Initial Assessment:   Oxygen Re-Evaluation:  Oxygen Re-Evaluation     [place] Name 04/05/22 0817 05/01/22 0756 05/31/22 0809 08/16/22 0833       Program Oxygen Prescription   Program Oxygen Prescription Continuous;E-Tanks Continuous;E-Tanks Continuous;E-Tanks Continuous;E-Tanks    Liters per minute -- 2 2 2       Home Oxygen   Home Oxygen Device E-Tanks;Home Concentrator E-Tanks;Home Concentrator E-Tanks;Home Concentrator E-Tanks;Home Concentrator    Sleep Oxygen Prescription Continuous Continuous Continuous Continuous    Liters per minute 2 2 2 2     Home Exercise Oxygen Prescription Continuous Continuous Continuous Continuous    Liters per minute 2 2 2 2     Home Resting Oxygen Prescription None None None None    Compliance with Home Oxygen Use Yes Yes Yes Yes      Goals/Expected Outcomes   Short Term Goals To learn and demonstrate proper pursed lip breathing techniques or other breathing techniques. ;To learn and exhibit compliance with exercise, home and travel O2 prescription;To learn and understand importance of monitoring SPO2 with pulse oximeter and demonstrate accurate use of the pulse oximeter.;To learn and understand importance of maintaining oxygen saturations>88%;To learn and demonstrate proper use of respiratory medications To learn and demonstrate proper pursed lip breathing techniques or other breathing techniques. ;To learn and exhibit compliance with exercise, home and travel O2 prescription;To learn and  understand importance of monitoring SPO2 with pulse oximeter and demonstrate accurate use of the pulse oximeter.;To learn and understand importance of maintaining oxygen saturations>88%;To learn and demonstrate proper use of respiratory medications To learn and demonstrate proper pursed lip breathing techniques or other breathing techniques. ;To learn and exhibit compliance with exercise, home and travel O2 prescription;To learn and understand importance of monitoring SPO2 with pulse oximeter and demonstrate accurate use of the pulse oximeter.;To learn and understand importance of maintaining oxygen saturations>88%;To learn and demonstrate proper use of respiratory medications To learn and demonstrate proper pursed lip breathing techniques or other breathing techniques. ;To learn and exhibit compliance with  exercise, home and travel O2 prescription;To learn and understand importance of monitoring SPO2 with pulse oximeter and demonstrate accurate use of the pulse oximeter.;To learn and understand importance of maintaining oxygen saturations>88%;To learn and demonstrate proper use of respiratory medications    Long  Term Goals Exhibits proper breathing techniques, such as pursed lip breathing or other method taught during program session;Maintenance of O2 saturations>88%;Exhibits compliance with exercise, home  and travel O2 prescription;Compliance with respiratory medication;Verbalizes importance of monitoring SPO2 with pulse oximeter and return demonstration;Demonstrates proper use of MDI's Exhibits proper breathing techniques, such as pursed lip breathing or other method taught during program session;Maintenance of O2 saturations>88%;Exhibits compliance with exercise, home  and travel O2 prescription;Compliance with respiratory medication;Verbalizes importance of monitoring SPO2 with pulse oximeter and return demonstration;Demonstrates proper use of MDI's Exhibits proper breathing techniques, such as pursed lip  breathing or other method taught during program session;Maintenance of O2 saturations>88%;Exhibits compliance with exercise, home  and travel O2 prescription;Compliance with respiratory medication;Verbalizes importance of monitoring SPO2 with pulse oximeter and return demonstration;Demonstrates proper use of MDI's Exhibits proper breathing techniques, such as pursed lip breathing or other method taught during program session;Maintenance of O2 saturations>88%;Exhibits compliance with exercise, home  and travel O2 prescription;Compliance with respiratory medication;Verbalizes importance of monitoring SPO2 with pulse oximeter and return demonstration;Demonstrates proper use of MDI's    Comments Verna is doing well in rehab.  She is using her oxygen regularly and staying compliant.  She is watching her saturations as well.  She has gotten better about using her PLB. Esmeraldo is doing well, she did miss some time after a fall.  She is good about using her PLB and practices it routinely at home.  She is good about wearing her oxygen for activity.  She continues to keep an eye on her saturations as well. Kasiya is doing well with her breathing.  She is compliant with her PLB and oxygen therapy.  She continues to keep an eye on her saturations at home.  While exercising this week at home she did drop to 91% on the bike. Alaniz is doing well back in rehab.  She has been doing pretty good with her breathing.  She has good days and bad days.  On the bad breathing days, she will use her home oxygen more.  She conitnues to use her PLB routinely.  She is using her nebulizer and inhaler routinely to manage her pulmonary disease.  She is monitoring her saturations and they have been good.    Goals/Expected Outcomes Short: Conitnue to work on PLB  Long: Continue to improve breathing Short: Conitneu to work on PLB Long: Conitnue to monitor lungs short; Continue to use PLB  Long; Continue to manage pulmonary disease Short: Continue to  use oxygen to help with breathing Long: conitnue to use PLB routinely             Oxygen Discharge (Final Oxygen Re-Evaluation):  Oxygen Re-Evaluation - 08/16/22 0833       Program Oxygen Prescription   Program Oxygen Prescription Continuous;E-Tanks    Liters per minute 2      Home Oxygen   Home Oxygen Device E-Tanks;Home Concentrator    Sleep Oxygen Prescription Continuous    Liters per minute 2    Home Exercise Oxygen Prescription Continuous    Liters per minute 2    Home Resting Oxygen Prescription None    Compliance with Home Oxygen Use Yes      Goals/Expected Outcomes   Short Term  Goals To learn and demonstrate proper pursed lip breathing techniques or other breathing techniques. ;To learn and exhibit compliance with exercise, home and travel O2 prescription;To learn and understand importance of monitoring SPO2 with pulse oximeter and demonstrate accurate use of the pulse oximeter.;To learn and understand importance of maintaining oxygen saturations>88%;To learn and demonstrate proper use of respiratory medications    Long  Term Goals Exhibits proper breathing techniques, such as pursed lip breathing or other method taught during program session;Maintenance of O2 saturations>88%;Exhibits compliance with exercise, home  and travel O2 prescription;Compliance with respiratory medication;Verbalizes importance of monitoring SPO2 with pulse oximeter and return demonstration;Demonstrates proper use of MDI's    Comments Mishaal is doing well back in rehab.  She has been doing pretty good with her breathing.  She has good days and bad days.  On the bad breathing days, she will use her home oxygen more.  She conitnues to use her PLB routinely.  She is using her nebulizer and inhaler routinely to manage her pulmonary disease.  She is monitoring her saturations and they have been good.    Goals/Expected Outcomes Short: Continue to use oxygen to help with breathing Long: conitnue to use PLB  routinely             Initial Exercise Prescription:   Perform Capillary Blood Glucose checks as needed.  Exercise Prescription Changes:   Exercise Prescription Changes     Row Name 03/27/22 1600 04/05/22 0800 04/10/22 1300 04/25/22 1000 05/08/22 1300     Response to Exercise   Blood Pressure (Admit) 118/62 -- 116/64 98/60 112/74   Blood Pressure (Exercise) 124/68 -- 136/64 114/64 --   Blood Pressure (Exit) 98/60 -- 122/60 94/62 122/72   Heart Rate (Admit) 94 bpm -- 83 bpm 74 bpm 89 bpm   Heart Rate (Exercise) 102 bpm -- 112 bpm 109 bpm 102 bpm   Heart Rate (Exit) 94 bpm -- 95 bpm 89 bpm 94 bpm   Oxygen Saturation (Admit) 97 % -- 97 % 99 % 99 %   Oxygen Saturation (Exercise) 94 % -- 92 % 96 % 96 %   Oxygen Saturation (Exit) 97 % -- 97 % 97 % 98 %   Rating of Perceived Exertion (Exercise) 15 -- 13 15 15    Perceived Dyspnea (Exercise) 2 -- 2 3 3    Symptoms SOB, fatigue -- SOB, fatigue SOB SOB   Comments 3rd full session of exercise -- -- -- --   Duration Progress to 30 minutes of  aerobic without signs/symptoms of physical distress -- Progress to 30 minutes of  aerobic without signs/symptoms of physical distress Continue with 30 min of aerobic exercise without signs/symptoms of physical distress. Continue with 30 min of aerobic exercise without signs/symptoms of physical distress.   Intensity THRR unchanged -- THRR unchanged THRR unchanged THRR unchanged     Progression   Progression Continue to progress workloads to maintain intensity without signs/symptoms of physical distress. -- Continue to progress workloads to maintain intensity without signs/symptoms of physical distress. Continue to progress workloads to maintain intensity without signs/symptoms of physical distress. Continue to progress workloads to maintain intensity without signs/symptoms of physical distress.   Average METs 1.8 -- 2.21 2.5 2.27     Resistance Training   Training Prescription Yes -- Yes Yes Yes    Weight 3 lb -- 3 lb 3 lb 2 lb   Reps 10-15 -- 10-15 10-15 10-15     Interval Training   Interval Training No --  No No --     Oxygen   Oxygen Continuous -- Continuous Continuous Continuous   Liters 2 -- 2 2 2      Recumbant Bike   Level 1 -- 1 -- 1   Watts 13 -- 19 -- --   Minutes 15 -- 15 -- 15   METs 2 -- 2.73 -- 2.51     NuStep   Level 1 -- 1 3 3    Minutes 80 -- 15 15 30    METs -- -- -- 2.8 2.3     T5 Nustep   Level 1 -- 1 -- 1   Minutes 15 -- 15 -- 15   METs 1.8 -- 1.9 -- --     Biostep-RELP   Level -- -- 1 2 1    SPM -- -- -- 50 --   Minutes -- -- 15 15 15    METs -- -- 2 2 2      Track   Laps -- -- -- 11 --   Minutes -- -- -- 15 --   METs -- -- -- 1.6 --     Home Exercise Plan   Plans to continue exercise at -- Home (comment)  walking, recumbent bike, bands, weights Home (comment)  walking, recumbent bike, bands, weights Home (comment)  walking, recumbent bike, bands, weights Home (comment)  walking, recumbent bike, bands, weights   Frequency -- Add 2 additional days to program exercise sessions. Add 2 additional days to program exercise sessions. Add 2 additional days to program exercise sessions. Add 2 additional days to program exercise sessions.   Initial Home Exercises Provided -- 04/05/22 04/05/22 04/05/22 04/05/22     Oxygen   Maintain Oxygen Saturation 88% or higher -- 88% or higher 88% or higher 88% or higher    Row Name 05/22/22 1400 06/05/22 1400 08/13/22 1500 08/28/22 1400       Response to Exercise   Blood Pressure (Admit) 112/64 102/60 124/72 110/64    Blood Pressure (Exit) 102/60 112/64 124/72 108/66    Heart Rate (Admit) 78 bpm 109 bpm 77 bpm 100 bpm    Heart Rate (Exercise) 107 bpm 140 bpm 94 bpm 116 bpm    Heart Rate (Exit) 82 bpm 116 bpm 88 bpm 92 bpm    Oxygen Saturation (Admit) 98 % 98 % 99 % 98 %    Oxygen Saturation (Exercise) 97 % 96 % 97 % 96 %    Oxygen Saturation (Exit) 98 % 97 % 98 % 98 %    Rating of Perceived Exertion (Exercise)  15 15 14 13     Perceived Dyspnea (Exercise) 3 3 2 2     Symptoms SOB SOB SOB SOB    Comments -- -- return back since December --    Duration Continue with 30 min of aerobic exercise without signs/symptoms of physical distress. Continue with 30 min of aerobic exercise without signs/symptoms of physical distress. Continue with 30 min of aerobic exercise without signs/symptoms of physical distress. Continue with 30 min of aerobic exercise without signs/symptoms of physical distress.    Intensity THRR unchanged THRR unchanged THRR unchanged THRR unchanged      Progression   Progression Continue to progress workloads to maintain intensity without signs/symptoms of physical distress. Continue to progress workloads to maintain intensity without signs/symptoms of physical distress. Continue to progress workloads to maintain intensity without signs/symptoms of physical distress. Continue to progress workloads to maintain intensity without signs/symptoms of physical distress.    Average METs 1.88 2.29 2.28  2.55      Resistance Training   Training Prescription Yes Yes Yes Yes    Weight 3 lb 3 lb 3 lb 4 lb    Reps 10-15 10-15 10-15 10-15      Interval Training   Interval Training No No No No      Oxygen   Oxygen Continuous Continuous Continuous Continuous    Liters 2 2 2 2       Recumbant Bike   Level -- 2 2 2     Watts -- -- 19 25    Minutes -- 15 15 15     METs -- 1.52 2.79 3.06      NuStep   Level 3 -- 3 --    Minutes 15 -- 15 --    METs -- -- 2.6 --      T5 Nustep   Level -- 2 1 2     Minutes -- 15 15 15     METs -- 1.8 2 2.1      Biostep-RELP   Level 1 3 -- 1    Minutes 15 15 -- 15    METs 2 3 -- 3.1      Track   Laps 10 15 14 25     Minutes 15 15 15 15     METs 1.54 1.82 1.76 2.36      Home Exercise Plan   Plans to continue exercise at Home (comment)  walking, recumbent bike, bands, weights Home (comment)  walking, recumbent bike, bands, weights Home (comment)  walking, recumbent  bike, bands, weights Home (comment)  walking, recumbent bike, bands, weights    Frequency Add 2 additional days to program exercise sessions. Add 2 additional days to program exercise sessions. Add 2 additional days to program exercise sessions. Add 2 additional days to program exercise sessions.    Initial Home Exercises Provided 04/05/22 04/05/22 04/05/22 04/05/22      Oxygen   Maintain Oxygen Saturation 88% or higher 88% or higher 88% or higher 88% or higher             Exercise Comments:   Exercise Goals and Review:   Exercise Goals Re-Evaluation :  Exercise Goals Re-Evaluation     Row Name 03/27/22 1544 04/05/22 0810 04/10/22 1402 04/25/22 1107 05/01/22 0737     Exercise Goal Re-Evaluation   Exercise Goals Review Increase Physical Activity;Increase Strength and Stamina;Understanding of Exercise Prescription Increase Physical Activity;Increase Strength and Stamina;Understanding of Exercise Prescription;Able to understand and use rate of perceived exertion (RPE) scale;Knowledge and understanding of Target Heart Rate Range (THRR);Able to understand and use Dyspnea scale;Able to check pulse independently Increase Physical Activity;Increase Strength and Stamina;Understanding of Exercise Prescription Increase Physical Activity;Increase Strength and Stamina;Understanding of Exercise Prescription Increase Physical Activity;Increase Strength and Stamina;Understanding of Exercise Prescription   Comments Keilly has had a good start to rehab. It is difficult for her to move around as she lacks the strength, it is also difficult for her to perform on the machines. Staff discussed with patient and thought she would benefit more from completing PT first, then coming back to pulmonary rehab. Referral was sent. In the meantime, patient will continue her exercise with [place] until she transfers over. She was able to try the T5 Nustep,  T4 Nustep, and recumbent bike. We will continue to monitor until we hear  back from PT. Reviewed home exercise with pt today.  Pt plans to use bands and weights at home for exercise.  She also has a recumbent bike to  use at home and walk with husband.  Reviewed THR, pulse, RPE, sign and symptoms, pulse oximetery and when to call 911 or MD.  Also discussed weather considerations and indoor options.  Pt voiced understanding. Srinidhi is doing well in rehab. While she has not increased her workloads on any machines yet, she has increased her average MET level to 2.21 METs. She also has tolerated using 3 lb weights for resistance training. We will continue to monitor her progress in the program. Ailaina continues to do well in rehab. She was able to increase her workload to level 3 on the T4 Nustep for part of the time. It was challenging for her but happy she was able to do it. She also walked 11 laps on the track which was her first time walking. We will continue to monitor. Yasha missed the last couple of weeks due to family medical concerns and then a fall at home.  Prior to her fall she was using her bike for 20 min each day and 10 min worth of weights each day.  She  has also been working on her breathing too.   Expected Outcomes Short: Continue to follow exercise prescription safetly Long: Build up strength and stamina SHort; Start to add in exercise at home Long: Continue to build strength Short: Begin to increase workloads on seated machines. Long: Continue to increase strength and stamina. Short: Work on T4 Nustep at level 3 the entire 15 minutes Long: Continue to increase overall MET level Short: Get back to exercise routine again Long: Continue to improve stamina    Row Name 05/08/22 1347 05/22/22 1454 05/31/22 0751 06/05/22 1443 06/20/22 1127     Exercise Goal Re-Evaluation   Exercise Goals Review Increase Physical Activity;Increase Strength and Stamina;Understanding of Exercise Prescription Increase Physical Activity;Increase Strength and Stamina;Understanding of Exercise  Prescription Increase Physical Activity;Increase Strength and Stamina;Understanding of Exercise Prescription Increase Physical Activity;Increase Strength and Stamina;Understanding of Exercise Prescription Increase Physical Activity;Increase Strength and Stamina;Understanding of Exercise Prescription   Comments Anhelica is doing well in rehab since returning from her fall. She was able to tolerate the T4 at level 3 for 30 minutes. She also was able to work at an average overall MET level of 2.27 METs. She did decrease her hand weights from 3 lb to 1 lb hand weights. We will continue to monitor her progress in the program. Doloras is slowing getting back into the routine after she was out for a week or so. Tobe was able to get 10 laps on the track and hope to see more walking incorporated into her exercise regimen. She is back up to 3 lbs with her handweights and staying consistent at level 3 on the T4 Nustep. She would benefit from increasing her Biostep to level 2. Will continue to monitor. Kamren is doing well in rehab.  She is riding her recumbent bike at home for 20 min and then weights and back to bike again.  She is going to take home a set of 3 lb weights to increase.  She continues to improve her strength and stamina.  She stopped doing some of her PT exercises but was encouraged to get back to them again. She wants to be independent in self care so she keeps working. Jazyiah is doing well in the program. She recently improved her overall average MET level back up above 2 METs. She has also done well with her seated machines as she improved to level 2 on the T5, level  3 on the biostep, and level 2 on the recumbent bike. She was able to walk up to 15 laps on the track as well. We will continue to monitor her progress in the program. Maylyn has not attended since last review as her husband had surgery done and is now not able to get transportation to rehab. We will follow up with patient to see when she is able to  return and hope to see good attendance thereafter.   Expected Outcomes Short: Continue to try walking more. Long: Continue to increase strength and stamina. Short: Increase to level 2 for Biostep Long: Continue to increase overall MET level Short: Get back to PT exercises for shoulder Long: Continue to improve stamina Short: Continue to push for more laps on the track. Long: Continue to improve strength and stamina. Short: Return to rehab when transportation allows Long: Graduate from the [place] Name 07/03/22 0846 07/16/22 1543 08/02/22 0938 08/13/22 1539 08/16/22 0819     Exercise Goal Re-Evaluation   Exercise Goals Review Increase Physical Activity;Increase Strength and Stamina;Understanding of Exercise Prescription Increase Physical Activity;Increase Strength and Stamina;Understanding of Exercise Prescription -- Increase Physical Activity;Increase Strength and Stamina;Understanding of Exercise Prescription Increase Physical Activity;Increase Strength and Stamina;Understanding of Exercise Prescription   Comments Lori has not attended since last review as her husband had surgery done and she is now not able to get transportation to rehab. We will follow up with patient to see when she is able to return and hope to see good attendance thereafter. Amulya ia waiting to hear from her husband's doctor on whether or not he is cleared to drive so she is able to have stable transportation again. Their follow up appt is this week. Tochi ia waiting to hear from her husband's doctor on whether or not he is cleared to drive so she is able to have stable transportation again. She reports that she has been doing some exercise at home. We will continue to follow up with her on when she is able to return to the program. Falan returned back to rehab after being out due to her husband's surgery. She eased back into her exercise regimen and will gradually increase it over time. She was able to still walk 14  laps on the track and exercise at level 3 on the T4 Nustep. We will continue to monitor as she slowly gets back into her routine again. Rubith returned to rehab last week.  She has been using her bike at home while she was out.  She has been doing 20 min on, 20 min active rest (weights or house work), and 20 min again on bike.  She has returned without any loss to stamina.  She is interested in trying treadmill next week.   Expected Outcomes Short: Return to rehab when transportation allows Long: Graduate from the [employer] Short: Return to rehab when transportation allows Long: Graduate from the [place]: Return to rehab when transportation allows. Long: Graduate from the [employer]. Short: Ease back into exercise prescription slowly Long: Continue to increase overall MET level and stamina Short: Try out treadmill Long: conitnue to exercise independently    Row Name 08/28/22 1436             Exercise Goal Re-Evaluation   Exercise Goals Review Increase Physical Activity;Increase Strength and Stamina;Understanding of Exercise Prescription       Comments Merla is doing well in rehab. She recently increased her overall average  MET level to 2.55 METs. She also improved to level 2 on the T5 nustep and walked up to 25 laps on the track. She increased from 3 lb to 4 lb hand weights for resistance training as well. We will continue to monitor her progress in the program.       Expected Outcomes Short: Try level 2 on the biostep. Long: Continue to improve strength and stamina.                Discharge Exercise Prescription (Final Exercise Prescription Changes):  Exercise Prescription Changes - 08/28/22 1400       Response to Exercise   Blood Pressure (Admit) 110/64    Blood Pressure (Exit) 108/66    Heart Rate (Admit) 100 bpm    Heart Rate (Exercise) 116 bpm    Heart Rate (Exit) 92 bpm    Oxygen Saturation (Admit) 98 %    Oxygen Saturation (Exercise) 96 %    Oxygen  Saturation (Exit) 98 %    Rating of Perceived Exertion (Exercise) 13    Perceived Dyspnea (Exercise) 2    Symptoms SOB    Duration Continue with 30 min of aerobic exercise without signs/symptoms of physical distress.    Intensity THRR unchanged      Progression   Progression Continue to progress workloads to maintain intensity without signs/symptoms of physical distress.    Average METs 2.55      Resistance Training   Training Prescription Yes    Weight 4 lb    Reps 10-15      Interval Training   Interval Training No      Oxygen   Oxygen Continuous    Liters 2      Recumbant Bike   Level 2    Watts 25    Minutes 15    METs 3.06      T5 Nustep   Level 2    Minutes 15    METs 2.1      Biostep-RELP   Level 1    Minutes 15    METs 3.1      Track   Laps 25    Minutes 15    METs 2.36      Home Exercise Plan   Plans to continue exercise at Home (comment)   walking, recumbent bike, bands, weights   Frequency Add 2 additional days to program exercise sessions.    Initial Home Exercises Provided 04/05/22      Oxygen   Maintain Oxygen Saturation 88% or higher             Nutrition:  Target Goals: Understanding of nutrition guidelines, daily intake of sodium 1500mg , cholesterol 200mg , calories 30% from fat and 7% or less from saturated fats, daily to have 5 or more servings of fruits and vegetables.  Education: All About Nutrition: -Group instruction provided by verbal, written material, interactive activities, discussions, models, and posters to present general guidelines for heart healthy nutrition including fat, fiber, MyPlate, the role of sodium in heart healthy nutrition, utilization of the nutrition label, and utilization of this knowledge for meal planning. Follow up email sent as well. Written material given at graduation. Flowsheet Row Pulmonary Rehab from 08/16/2022 in [facility] Cardiac and Pulmonary Rehab  Date 05/03/22  [05/17/22 Part II]  Educator [facility]   Instruction Review Code 1- Verbalizes Understanding       Biometrics:    Nutrition Therapy Plan and Nutrition Goals:  Nutrition Therapy & Goals - 03/26/22 [phone]  Nutrition Therapy   Diet Heart healthy, low Na    Drug/Food Interactions Statins/Certain Fruits    Protein (specify units) 100-105g    Whole Grain Foods 2 servings   Her food intake is limited due to appetite   Fruits and Vegetables 5 servings/day   Her food intake is limited due to appetite   Sodium 2 grams      Personal Nutrition Goals   Nutrition Goal ST: consider adding in a nutritional supplement, practice mechanical eating, eat protein foods first, aim to include fiber, fat, and protein at most meals. LT: Meet energy/protein needs    Comments 62 y.o. F admitted to pulmonary rehab for stage 3 severe COPD by GOLD classification. PMHx breast cancer s/p chemotherapy and radiation as of 2022, HTN, atherosclerosis, T2DM. Relevant medications includes farxiga, metformin, zofran, nucynta, prochlorperazine, crestor, zoloft, cymbalta, tirzepatide, cyclobenzaprine, hydrochlorothiazide, oxycodone, senokot, lyrica, senokot, fluconazole, anastrozole. She reports limiting/avoiding carbohydrates as she is nervous they will turn to fat; discussed how carbohydrates packaged with fiber like fruit, starchy vegetables, and whole grains can contribute energy rich carbohydrates, fiber, as well as vitamins and minerals. She is interested in including these foods again. Since her chemotherapy she has not had any taste, but some of it is coming back such as sweet and salty flavors - she feels almost too well; a hard candy was too sweet for her and she had to spit it out. She reports that her appetite has been lower overall since the chemotherapy: good day she will have two meals and a bad day she may only have one meal like 1/2 cup of cabbage. B: egg white with whole grain toast or egg white with cheese D: Her husband cooks for her. She reports  that instead of spaghetti she will use spaghetti squash, taco salad with ground [place], fish on the grill. She reports that she has lost some weight recently, she has been trying to lose; discussed how meeting calorie and protein needs is important. Discussed mechanical eating, how to include more calories with lower volume, eat protein rich foods first, and consider nutritional supplements. Discussed heart healthy eating, pulmonary MNT, as well as T2DM MNT.      Intervention Plan   Intervention Prescribe, educate and counsel regarding individualized specific dietary modifications aiming towards targeted core components such as weight, hypertension, lipid management, diabetes, heart failure and other comorbidities.    Expected Outcomes Short Term Goal: Understand basic principles of dietary content, such as calories, fat, sodium, cholesterol and nutrients.;Short Term Goal: A plan has been developed with personal nutrition goals set during dietitian appointment.;Long Term Goal: Adherence to prescribed nutrition plan.             Nutrition Assessments:  MEDIFICTS Score Key: ?70 Need to make dietary changes  40-70 Heart Healthy Diet ? 40 Therapeutic Level Cholesterol Diet  Flowsheet Row Pulmonary Rehab from 02/26/2022 in [facility] Cardiac and Pulmonary Rehab  Picture Your Plate Total Score on Admission 75      Picture Your Plate Scores: [id number] Unhealthy dietary pattern with much room for improvement. 41-50 Dietary pattern unlikely to meet recommendations for good health and room for improvement. 51-60 More healthful dietary pattern, with some room for improvement.  >60 Healthy dietary pattern, although there may be some specific behaviors that could be improved.   Nutrition Goals Re-Evaluation:  Nutrition Goals Re-Evaluation     [place] Name 05/01/22 0752 05/31/22 0758 08/16/22 0827         Goals   Nutrition  Goal ST: consider adding in a nutritional supplement, practice mechanical eating, eat  protein foods first, aim to include fiber, fat, and protein at most meals. LT: Meet energy/protein needs SHort; Continue mechanical eating until full Long: Continue to get in more protein SHort; Continue mechanical eating until full Long: Continue to get in more protein     Comment Zai is practicing mechanical eating as she knows she needs to eat, but still not feeling hungry.  She is still working on getting in more protein.  Her husband is trying to stay on top of her about it and makes sure she is eating daily. Jaydon is doing well. She is now eating when she is hungry.  She has not been doing her mechanincal eating.  She says that her stomach has been hurting which can be related to her increased stress. We talked about the importance of eating to fuel her system.  She was encouraged to try meal replacement shakes or protein boosters.  She said she tried before and it hurt her stomach. Rajae is still not eating a lot and her husband will ask her if she ate.  One meal fills her up but she is unable to eat without a bowel movement.  She is also walking to help with bowel movements.  She was encouraged to continue to use mechanical eating.  She still has not tried meal replacement shakes.  Her husband bought her some, but she has not tried them yet.  She was encouraged to try blending it with ice to make is more palatable to her.     Expected Outcome SHort; Continue mechanical eating until full Long: Continue to get in more protein Short: Find a source of protein she can drink. Long; Continue to work on eating more Short: Try meal replacment shakes Long; Conitnue to work on mechanical eating              Nutrition Goals Discharge (Final Nutrition Goals Re-Evaluation):  Nutrition Goals Re-Evaluation - 08/16/22 0827       Goals   Nutrition Goal SHort; Continue mechanical eating until full Long: Continue to get in more protein    Comment Breelle is still not eating a lot and her husband will ask her if  she ate.  One meal fills her up but she is unable to eat without a bowel movement.  She is also walking to help with bowel movements.  She was encouraged to continue to use mechanical eating.  She still has not tried meal replacement shakes.  Her husband bought her some, but she has not tried them yet.  She was encouraged to try blending it with ice to make is more palatable to her.    Expected Outcome Short: Try meal replacment shakes Long; Conitnue to work on mechanical eating             Psychosocial: Target Goals: Acknowledge presence or absence of significant depression and/or stress, maximize coping skills, provide positive support system. Participant is able to verbalize types and ability to use techniques and skills needed for reducing stress and depression.   Education: Stress, Anxiety, and Depression - Group verbal and visual presentation to define topics covered.  Reviews how body is impacted by stress, anxiety, and depression.  Also discusses healthy ways to reduce stress and to treat/manage anxiety and depression.  Written material given at graduation. Flowsheet Row Pulmonary Rehab from 08/16/2022 in [facility] Cardiac and Pulmonary Rehab  Date 04/11/22  Educator [facility]  Instruction  Review Code 1- Verbalizes Understanding       Education: Sleep Hygiene -Provides group verbal and written instruction about how sleep can affect your health.  Define sleep hygiene, discuss sleep cycles and impact of sleep habits. Review good sleep hygiene tips.    Initial Review & Psychosocial Screening:   Quality of Life Scores:  Scores of 19 and below usually indicate a poorer quality of life in these areas.  A difference of  2-3 points is a clinically meaningful difference.  A difference of 2-3 points in the total score of the Quality of Life Index has been associated with significant improvement in overall quality of life, self-image, physical symptoms, and general health in studies assessing change  in quality of life.  PHQ-9: Review Flowsheet       08/16/2022 05/31/2022 05/01/2022 02/26/2022  Depression screen PHQ 2/9  Decreased Interest 2 1 1 2   Down, Depressed, Hopeless 1 1 1 1   PHQ - 2 Score 3 2 2 3   Altered sleeping 1 0 0 3  Tired, decreased energy 2 3 3 3   Change in appetite 1 2 1 2   Feeling bad or failure about yourself  0 1 1 1   Trouble concentrating 2 1 1 3   Moving slowly or fidgety/restless 2 1 1 2   Suicidal thoughts 0 0 0 0  PHQ-9 Score 11 10 9 17   Difficult doing work/chores Somewhat difficult Somewhat difficult Somewhat difficult Somewhat difficult   Interpretation of Total Score  Total Score Depression Severity:  1-4 = Minimal depression, 5-9 = Mild depression, 10-14 = Moderate depression, 15-19 = Moderately severe depression, 20-27 = Severe depression   Psychosocial Evaluation and Intervention:   Psychosocial Re-Evaluation:  Psychosocial Re-Evaluation     Row Name 04/05/22 0811 05/01/22 0739 05/31/22 0755 08/16/22 0825       Psychosocial Re-Evaluation   Current issues with Current Depression;Current Anxiety/Panic;Current Psychotropic Meds;Current Sleep Concerns;Current Stress Concerns Current Depression;Current Anxiety/Panic;Current Psychotropic Meds;Current Sleep Concerns;Current Stress Concerns Current Depression;Current Anxiety/Panic;Current Psychotropic Meds;Current Sleep Concerns;Current Stress Concerns Current Depression;Current Anxiety/Panic;Current Psychotropic Meds;Current Sleep Concerns;Current Stress Concerns    Comments Pamalla is doing well in rehab.  She has been taking her meds still and feeling pretty good.  She does not feel perfectly balanced yet, but she is working with her doctor.  She continues to struggle with her sleep as she only gets about 3-4 hrs a night and then wakes up and unable to go to sleep.  She has done melatonin before and has tried 5mg  and 7.5 mg but not ten yet, so she will try.  She conitnues to work on it, we talked about  importance of sleep too. Chaylene has been doing well up until a fall last week.  Her PHQ has greatly improved from 17 down to 9.  She is feeling better up until her fall.  She is sleeping better and trying to find the good.  She just get frustrated that she is unable to do everything she wants to.  She wants to continue to build up her strength and stamina.  She is feeling like her mood is balanced Miliani is doing well in rehab.  She saw her doctor yesterday and they increased her depression meds to help her cope better.  Her husband has just been diagnosised with cancer and they are now undergoing lots of appts and blood work to [employer] treatment.  He will have surgery in January and they hope to remove it.  She is sleeping good despite  getting up in the night at eat.  Her PHQ has increased slightly, but she just had a med increase and has a lot of stress with her husband recently Avanya has been out with her husband's surgery. She has been exercising at home and both are happy to have her back.  Her PHQ did worsen by a point, but she has been having issues with sleep and her medications.  They have been causing hallucincations.  She talked to her doctor about it, and they have reduced the dosage and it seems to be helping now.  She is happy to be back in rehab and wants to try out treadmill to push herself.    Expected Outcomes -- Short: Get back to routine exercise again Long: Continue to exercise to build stamina. Short; Conitnue to cope with husbands diagnosis Long: Conitnue to exercise for mental boost Short: Continue to work with doctor abdout sleep Long: Conitnue to exericse for mental  boost    Interventions Stress management education;Encouraged to attend Pulmonary Rehabilitation for the exercise Stress management education;Encouraged to attend Pulmonary Rehabilitation for the exercise Stress management education;Encouraged to attend Pulmonary Rehabilitation for the exercise Stress management  education;Encouraged to attend Pulmonary Rehabilitation for the exercise    Continue Psychosocial Services  Follow up required by staff Follow up required by staff Follow up required by staff Follow up required by staff             Psychosocial Discharge (Final Psychosocial Re-Evaluation):  Psychosocial Re-Evaluation - 08/16/22 0825       Psychosocial Re-Evaluation   Current issues with Current Depression;Current Anxiety/Panic;Current Psychotropic Meds;Current Sleep Concerns;Current Stress Concerns    Comments Telissa has been out with her husband's surgery. She has been exercising at home and both are happy to have her back.  Her PHQ did worsen by a point, but she has been having issues with sleep and her medications.  They have been causing hallucincations.  She talked to her doctor about it, and they have reduced the dosage and it seems to be helping now.  She is happy to be back in rehab and wants to try out treadmill to push herself.    Expected Outcomes Short: Continue to work with doctor abdout sleep Long: Conitnue to exericse for mental  boost    Interventions Stress management education;Encouraged to attend Pulmonary Rehabilitation for the exercise    Continue Psychosocial Services  Follow up required by staff             Education: Education Goals: Education classes will be provided on a weekly basis, covering required topics. Participant will state understanding/return demonstration of topics presented.  Learning Barriers/Preferences:   General Pulmonary Education Topics:  Infection Prevention: - Provides verbal and written material to individual with discussion of infection control including proper hand washing and proper equipment cleaning during exercise session. Flowsheet Row Pulmonary Rehab from 08/16/2022 in [facility] Cardiac and Pulmonary Rehab  Date 02/26/22  Educator [facility]  Instruction Review Code 1- Verbalizes Understanding       Falls Prevention: - Provides  verbal and written material to individual with discussion of falls prevention and safety. Flowsheet Row Pulmonary Rehab from 08/16/2022 in [facility] Cardiac and Pulmonary Rehab  Date 02/26/22  Educator [facility]  Instruction Review Code 1- Verbalizes Understanding       Chronic Lung Disease Review: - Group verbal instruction with posters, models, PowerPoint presentations and videos,  to review new updates, new respiratory medications, new advancements in procedures and treatments.  Providing information on websites and "800" numbers for continued self-education. Includes information about supplement oxygen, available portable oxygen systems, continuous and intermittent flow rates, oxygen safety, concentrators, and Medicare reimbursement for oxygen. Explanation of Pulmonary Drugs, including class, frequency, complications, importance of spacers, rinsing mouth after steroid MDI's, and proper cleaning methods for nebulizers. Review of basic lung anatomy and physiology related to function, structure, and complications of lung disease. Review of risk factors. Discussion about methods for diagnosing sleep apnea and types of masks and machines for OSA. Includes a review of the use of types of environmental controls: home humidity, furnaces, filters, dust mite/pet prevention, HEPA vacuums. Discussion about weather changes, air quality and the benefits of nasal washing. Instruction on Warning signs, infection symptoms, calling MD promptly, preventive modes, and value of vaccinations. Review of effective airway clearance, coughing and/or vibration techniques. Emphasizing that all should Create an Action Plan. Written material given at graduation. Flowsheet Row Pulmonary Rehab from 08/16/2022 in [facility] Cardiac and Pulmonary Rehab  Education need identified 02/26/22  Date 04/05/22  Educator [facility]  Instruction Review Code 1- Verbalizes Understanding       AED/CPR: - Group verbal and written instruction with the use of models to  demonstrate the basic use of the AED with the basic ABC's of resuscitation.    Anatomy and Cardiac Procedures: - Group verbal and visual presentation and models provide information about basic cardiac anatomy and function. Reviews the testing methods done to diagnose heart disease and the outcomes of the test results. Describes the treatment choices: Medical Management, Angioplasty, or Coronary Bypass Surgery for treating various heart conditions including Myocardial Infarction, Angina, Valve Disease, and Cardiac Arrhythmias.  Written material given at graduation. Flowsheet Row Pulmonary Rehab from 08/16/2022 in [facility] Cardiac and Pulmonary Rehab  Date 05/24/22  Educator [facility]  Instruction Review Code 1- Verbalizes Understanding       Medication Safety: - Group verbal and visual instruction to review commonly prescribed medications for heart and lung disease. Reviews the medication, class of the drug, and side effects. Includes the steps to properly store meds and maintain the prescription regimen.  Written material given at graduation. Flowsheet Row Pulmonary Rehab from 08/16/2022 in [facility] Cardiac and Pulmonary Rehab  Date 08/09/22  Educator [facility]  Instruction Review Code 1- Verbalizes Understanding       Other: -Provides group and verbal instruction on various topics (see comments)   Knowledge Questionnaire Score:    Core Components/Risk Factors/Patient Goals at Admission:   Education:Diabetes - Individual verbal and written instruction to review signs/symptoms of diabetes, desired ranges of glucose level fasting, after meals and with exercise. Acknowledge that pre and post exercise glucose checks will be done for 3 sessions at entry of program. Flowsheet Row Pulmonary Rehab from 08/16/2022 in [facility] Cardiac and Pulmonary Rehab  Date 02/26/22  Educator [facility]  Instruction Review Code 1- Verbalizes Understanding       Know Your Numbers and Heart Failure: - Group verbal and visual  instruction to discuss disease risk factors for cardiac and pulmonary disease and treatment options.  Reviews associated critical values for Overweight/Obesity, Hypertension, Cholesterol, and Diabetes.  Discusses basics of heart failure: signs/symptoms and treatments.  Introduces Heart Failure Zone chart for action plan for heart failure.  Written material given at graduation. Flowsheet Row Pulmonary Rehab from 08/16/2022 in [facility] Cardiac and Pulmonary Rehab  Date 08/16/22  Educator [facility]  Instruction Review Code 1- Verbalizes Understanding       Core Components/Risk Factors/Patient Goals Review:  Goals and Risk Factor Review     Row Name 04/05/22 0815 05/01/22 0754 05/31/22 0803 08/16/22 0830       Core Components/Risk Factors/Patient Goals Review   Personal Goals Review Weight Management/Obesity;Improve shortness of breath with ADL's;Increase knowledge of respiratory medications and ability to use respiratory devices properly.;Diabetes;Hypertension;Lipids Weight Management/Obesity;Improve shortness of breath with ADL's;Increase knowledge of respiratory medications and ability to use respiratory devices properly.;Diabetes;Hypertension;Lipids Weight Management/Obesity;Improve shortness of breath with ADL's;Increase knowledge of respiratory medications and ability to use respiratory devices properly.;Diabetes;Hypertension;Lipids Weight Management/Obesity;Improve shortness of breath with ADL's;Increase knowledge of respiratory medications and ability to use respiratory devices properly.;Diabetes;Hypertension;Lipids    Review Denia is doing well in rehab.  Her weight has been trending down.  Her breathing is getting better for most part but still has harder days.  She is doing well with her inhalers and nebulizers.  Her sugars are doing well around 120s in the morning.  Her pressures have been staying good.  She continues to check both at home. Julitza is doing well in rehab. She missed some time with  medical appointments and a fall last week.  She is doing better with her breahing and practices it at home.  She is keeping her weight steady and watching her pressures and sugars at home with her husbands help.  She is good about using her nebulizer and inhalers. Amelianna is doing well in rehab.  Garnetta is losing weight.  We talked about adding in protein to her diet.  She is finding that she is doing better with her breathing for most part.  She still has some bad breathing days, but it is getting better.  She continues to build her stamina to be able to do to do more around the house.  Her blood sugars are stable feeling and her Alc was 6.6!!  She was unable to get a new meter on her insurance so her doctor ordered her another meter.   She continues to work to get it down.  Her pressures have been on the lower side but she has not had problems with it. Cagney returned to rehab after being out with her husband.  Her weight was up 4 lb while she was out.  She hopes to maintain her weight, but she is not eating like she should and we talked about meal replacement shakes.  Her sugars have been doing well at home.  Her pressures have been doing well too. Her breathing has good days and bad days.  She has been wearing her oxygen more at home.  She continues to use her PLB.  She is doing well on her inhaler and nebulizer.    Expected Outcomes Short: Conintue to work on breathing Long: Conitnue to monitor risk factors Short: Conitnue to work on breathing Long: Conitnue to monitor weight to not lose SHort Get new meter Long: Continue to monitor risk factors Short: Try meal replacement shakes Long: Continue to monitor risk factors             Core Components/Risk Factors/Patient Goals at Discharge (Final Review):   Goals and Risk Factor Review - 08/16/22 0830       Core Components/Risk Factors/Patient Goals Review   Personal Goals Review Weight Management/Obesity;Improve shortness of breath with ADL's;Increase  knowledge of respiratory medications and ability to use respiratory devices properly.;Diabetes;Hypertension;Lipids    Review Shyniece returned to rehab after being out with her husband.  Her weight was up 4 lb while she was out.  She hopes  to maintain her weight, but she is not eating like she should and we talked about meal replacement shakes.  Her sugars have been doing well at home.  Her pressures have been doing well too. Her breathing has good days and bad days.  She has been wearing her oxygen more at home.  She continues to use her PLB.  She is doing well on her inhaler and nebulizer.    Expected Outcomes Short: Try meal replacement shakes Long: Continue to monitor risk factors             ITP Comments:  ITP Comments     Row Name 03/21/22 0723 03/26/22 1439 03/27/22 1545 04/18/22 0935 05/01/22 0736   ITP Comments 30 Day review completed. Medical Director ITP review done, changes made as directed, and signed approval by Medical Director.    New to program Completed initial RD consultation Staff felt that patient would better benefit from going to Physical Therapy first prior to finishing rehab.  She would do better in rehab if she could build up her strength and balance in physical therapy.  Note sent to MD to request PT referral. We will continue with rehab until set up.  Staff spoke with Ersie and her husband and they both agreed and voiced understanding. 30 Day review completed. Medical Director ITP review done, changes made as directed, and signed approval by Medical Director.    Eythel has continued PR and is showing improvement with independence of movement Keyona had a fall off the toliet last week which kept her out for an extra day.    [place] Name 05/16/22 0808 06/04/22 1242 06/13/22 1021 06/19/22 1133 07/03/22 1423   ITP Comments 30 Day review completed. Medical Director ITP review done, changes made as directed, and signed approval by Medical Director. Rozlin will be out for several weeks as  her husband is getting surgery and she will not have transportation to rehab- she also wants to be there for for him. We will place patient on hold and contact her after the New Year to see when she will return. 30 Day review completed. Medical Director ITP review done, changes made as directed, and signed approval by Medical Director. Nehir has been out as she has not had transportation due to her husband's surgery.  He is still not able to drive and currently on narcotics.  He has a follow up next week but still not able to drive and then sit.  Thus, she will not be able to attend due to transportation issues.  We will leave her out on hold until he is able to drive again. She is riding her bike and using her weights at home in the mean time. Called patient to check in.  She is doing her exercise at home, but her husband still cannot drive.  His next follow up in on 07/20/22.  We have extended her medical hold until then.  She will keep [place] posted on his ability to drive.    [place] Name 08/07/22 [phone] 08/08/22 1259 09/05/22 0824       ITP Comments Kahlani returned today.  She was able to return to her normal exercise routine in rehab. We will follow up with her again once she has regular attendance. 30 day review completed. ITP sent to Dr. Zetta Bills, Medical Director of  Pulmonary Rehab. Continue with ITP unless changes are made by physician. 30 Day review completed. Medical Director ITP review done, changes made as directed, and signed  approval by Medical Director.              Comments:

## 2022-09-06 ENCOUNTER — Encounter: Payer: PPO | Admitting: *Deleted

## 2022-09-06 DIAGNOSIS — J449 Chronic obstructive pulmonary disease, unspecified: Secondary | ICD-10-CM

## 2022-09-06 NOTE — Progress Notes (Signed)
Daily Session Note  Patient Details  Name: Ruth White MRN: [id number] Date of Birth: 07-27-1960 Referring Provider:   Flowsheet Row Pulmonary Rehab from 02/26/2022 in [facility] Cardiac and Pulmonary Rehab  Referring Provider Margaretha Seeds MD       Encounter Date: 09/06/2022  Check In:  Session Check In - 09/06/22 0755       Check-In   Supervising physician immediately available to respond to emergencies See telemetry face sheet for immediately available ER MD    Location [facility]-Cardiac & Pulmonary Rehab    Staff Present Darlyne Russian, RN, ADN;Krista Spencer RN, Abel Presto, MS, ACSM CEP, Exercise Physiologist;Joseph Tessie Fass, [place]    Virtual Visit No    Medication changes reported     No    Fall or balance concerns reported    No    Warm-up and Cool-down Performed on first and last piece of equipment    Resistance Training Performed Yes    VAD Patient? No    PAD/SET Patient? No      Pain Assessment   Currently in Pain? No/denies                Social History   Tobacco Use  Smoking Status Never  Smokeless Tobacco Never    Goals Met:  Independence with exercise equipment Exercise tolerated well No report of concerns or symptoms today Strength training completed today  Goals Unmet:  Not Applicable  Comments: Pt able to follow exercise prescription today without complaint.  Will continue to monitor for progression.    Dr. Emily Filbert is Medical Director for [place].  Dr. Ottie Glazier is Medical Director for [facility] Pulmonary Rehabilitation.

## 2022-09-11 ENCOUNTER — Encounter: Payer: PPO | Admitting: *Deleted

## 2022-09-11 DIAGNOSIS — J449 Chronic obstructive pulmonary disease, unspecified: Secondary | ICD-10-CM | POA: Diagnosis not present

## 2022-09-11 NOTE — Progress Notes (Signed)
Daily Session Note  Patient Details  Name: Ruth White MRN: [id number] Date of Birth: [birth year] Referring Provider:   Flowsheet Row Pulmonary Rehab from 02/26/2022 in [facility] Cardiac and Pulmonary Rehab  Referring Provider Margaretha Seeds MD       Encounter Date: 09/11/2022  Check In:  Session Check In - 09/11/22 0835       Check-In   Supervising physician immediately available to respond to emergencies See telemetry face sheet for immediately available ER MD    Location [facility]-Cardiac & Pulmonary Rehab    Staff Present Heath Lark, RN, BSN, CCRP;Jessica [place], MA, RCEP, CCRP, Bertram Gala, MS, ACSM CEP, Exercise Physiologist    Virtual Visit No    Medication changes reported     No    Warm-up and Cool-down Performed on first and last piece of equipment    Resistance Training Performed Yes    PAD/SET Patient? No      Pain Assessment   Currently in Pain? No/denies                Social History   Tobacco Use  Smoking Status Never  Smokeless Tobacco Never    Goals Met:  Proper associated with RPD/PD & O2 Sat Independence with exercise equipment Exercise tolerated well No report of concerns or symptoms today  Goals Unmet:  Not Applicable  Comments: Pt able to follow exercise prescription today without complaint.  Will continue to monitor for progression.    Dr. Emily Filbert is Medical Director for [place].  Dr. Ottie Glazier is Medical Director for [facility] Pulmonary Rehabilitation.

## 2022-09-13 ENCOUNTER — Encounter: Payer: PPO | Admitting: *Deleted

## 2022-09-13 DIAGNOSIS — J449 Chronic obstructive pulmonary disease, unspecified: Secondary | ICD-10-CM

## 2022-09-13 NOTE — Progress Notes (Signed)
Daily Session Note  Patient Details  Name: Ruth White MRN: [id number] Date of Birth: [birth year] Referring Provider:   Flowsheet Row Pulmonary Rehab from 02/26/2022 in [facility] Cardiac and Pulmonary Rehab  Referring Provider Margaretha Seeds MD       Encounter Date: 09/13/2022  Check In:  Session Check In - 09/13/22 0805       Check-In   Supervising physician immediately available to respond to emergencies See telemetry face sheet for immediately available ER MD    Location [facility]-Cardiac & Pulmonary Rehab    Staff Present Darlyne Russian, RN, ADN;Jessica Luan Pulling, MA, RCEP, CCRP, CCET;Joseph Cisco, [place]    Virtual Visit No    Medication changes reported     No    Fall or balance concerns reported    No    Warm-up and Cool-down Performed on first and last piece of equipment    Resistance Training Performed Yes    VAD Patient? No    PAD/SET Patient? No      Pain Assessment   Currently in Pain? No/denies                Social History   Tobacco Use  Smoking Status Never  Smokeless Tobacco Never    Goals Met:  Independence with exercise equipment Exercise tolerated well No report of concerns or symptoms today Strength training completed today  Goals Unmet:  Not Applicable  Comments: Pt able to follow exercise prescription today without complaint.  Will continue to monitor for progression.    Dr. Emily Filbert is Medical Director for [place].  Dr. Ottie Glazier is Medical Director for [facility] Pulmonary Rehabilitation.

## 2022-09-18 ENCOUNTER — Encounter: Payer: PPO | Attending: Pulmonary Disease | Admitting: *Deleted

## 2022-09-18 DIAGNOSIS — J449 Chronic obstructive pulmonary disease, unspecified: Secondary | ICD-10-CM

## 2022-09-18 NOTE — Progress Notes (Signed)
Daily Session Note  Patient Details  Name: Ruth White MRN: [id number] Date of Birth: 1960-11-27 Referring Provider:   Flowsheet Row Pulmonary Rehab from 02/26/2022 in [facility] Cardiac and Pulmonary Rehab  Referring Provider Margaretha Seeds MD       Encounter Date: 09/18/2022  Check In:  Session Check In - 09/18/22 0841       Check-In   Supervising physician immediately available to respond to emergencies See telemetry face sheet for immediately available ER MD    Location [facility]-Cardiac & Pulmonary Rehab    Staff Present Heath Lark, RN, BSN, Kathaleen Maser, MS, ACSM CEP, Exercise Physiologist;Other   Gretchen Short BS Exercise Science   Virtual Visit No    Medication changes reported     No    Fall or balance concerns reported    No    Warm-up and Cool-down Performed on first and last piece of equipment    Resistance Training Performed Yes    VAD Patient? No    PAD/SET Patient? No      Pain Assessment   Currently in Pain? No/denies                Social History   Tobacco Use  Smoking Status Never  Smokeless Tobacco Never    Goals Met:  Proper associated with RPD/PD & O2 Sat Independence with exercise equipment Exercise tolerated well No report of concerns or symptoms today  Goals Unmet:  Not Applicable  Comments: Pt able to follow exercise prescription today without complaint.  Will continue to monitor for progression.    Dr. Emily Filbert is Medical Director for [place].  Dr. Ottie Glazier is Medical Director for [facility] Pulmonary Rehabilitation.

## 2022-09-20 ENCOUNTER — Encounter: Payer: PPO | Admitting: *Deleted

## 2022-09-20 DIAGNOSIS — J449 Chronic obstructive pulmonary disease, unspecified: Secondary | ICD-10-CM

## 2022-09-20 NOTE — Progress Notes (Signed)
Daily Session Note  Patient Details  Name: Quinlee Wadman MRN: [id number] Date of Birth: [birth year] Referring Provider:   Flowsheet Row Pulmonary Rehab from 02/26/2022 in [facility] Cardiac and Pulmonary Rehab  Referring Provider Margaretha Seeds MD       Encounter Date: 09/20/2022  Check In:  Session Check In - 09/20/22 0811       Check-In   Supervising physician immediately available to respond to emergencies See telemetry face sheet for immediately available ER MD    Location [facility]-Cardiac & Pulmonary Rehab    Staff Present Darlyne Russian, RN, Lorin Mercy, MS, ACSM CEP, Exercise Physiologist;Joseph Tessie Fass, [place]    Virtual Visit No    Medication changes reported     No    Fall or balance concerns reported    No    Warm-up and Cool-down Performed on first and last piece of equipment    Resistance Training Performed Yes    VAD Patient? No    PAD/SET Patient? No      Pain Assessment   Currently in Pain? No/denies                Social History   Tobacco Use  Smoking Status Never  Smokeless Tobacco Never    Goals Met:  Independence with exercise equipment Exercise tolerated well No report of concerns or symptoms today Strength training completed today  Goals Unmet:  Not Applicable  Comments: Pt able to follow exercise prescription today without complaint.  Will continue to monitor for progression.    Dr. Emily Filbert is Medical Director for [place].  Dr. Ottie Glazier is Medical Director for [facility] Pulmonary Rehabilitation.

## 2022-09-21 ENCOUNTER — Telehealth: Payer: Self-pay | Admitting: Pulmonary Disease

## 2022-09-21 NOTE — Telephone Encounter (Signed)
Called again.[phone] is the actual fax number. Disregard the previous numbers given.

## 2022-09-25 ENCOUNTER — Encounter: Payer: PPO | Admitting: *Deleted

## 2022-09-25 DIAGNOSIS — J449 Chronic obstructive pulmonary disease, unspecified: Secondary | ICD-10-CM | POA: Diagnosis not present

## 2022-09-25 NOTE — Progress Notes (Signed)
Daily Session Note  Patient Details  Name: Ruth White MRN: [id number] Date of Birth: 1961/04/22 Referring Provider:   Flowsheet Row Pulmonary Rehab from 02/26/2022 in [facility] Cardiac and Pulmonary Rehab  Referring Provider Luciano Cutter MD       Encounter Date: 09/25/2022  Check In:  Session Check In - 09/25/22 0801       Check-In   Supervising physician immediately available to respond to emergencies See telemetry face sheet for immediately available ER MD    Location [facility]-Cardiac & Pulmonary Rehab    Staff Present Cyndia Diver, RN, BSN, Damita Dunnings, MA, RCEP, CCRP, Zackery Barefoot, MS, ACSM CEP, Exercise Physiologist    Virtual Visit No    Medication changes reported     No    Fall or balance concerns reported    Yes    Comments Pt reports that she 'slipped/slid slowly to the floor" while putting on deodorant, while wearing only socks, 3 days ago. Her husband usually helps her but was not there this time. Pt reports she is "sore." Denies broken skin. Bruise on R thigh is getting better. Pr fully aware of what happened and how to prevent recurrence.    Tobacco Cessation No Change    Warm-up and Cool-down Performed on first and last piece of equipment    Resistance Training Performed Yes    VAD Patient? No    PAD/SET Patient? No      Pain Assessment   Currently in Pain? No/denies                Social History   Tobacco Use  Smoking Status Never  Smokeless Tobacco Never    Goals Met:  Independence with exercise equipment Exercise tolerated well No report of concerns or symptoms today  Goals Unmet:  Not Applicable  Comments: Pt able to follow exercise prescription today without complaint.  Will continue to monitor for progression.    Dr. Bethann Punches is Medical Director for [facility] Cardiac Rehabilitation.  Dr. Vida Rigger is Medical Director for [facility] Pulmonary Rehabilitation.

## 2022-09-28 ENCOUNTER — Encounter: Payer: PPO | Admitting: *Deleted

## 2022-09-28 VITALS — Ht 63.5 in | Wt 160.3 lb

## 2022-09-28 DIAGNOSIS — J449 Chronic obstructive pulmonary disease, unspecified: Secondary | ICD-10-CM | POA: Diagnosis not present

## 2022-09-28 NOTE — Progress Notes (Signed)
Daily Session Note  Patient Details  Name: Ruth White MRN: [id number] Date of Birth: 09/10/1960 Referring Provider:   Flowsheet Row Pulmonary Rehab from 02/26/2022 in [facility] Cardiac and Pulmonary Rehab  Referring Provider Luciano Cutter MD       Encounter Date: 09/28/2022  Check In:  Session Check In - 09/28/22 0753       Check-In   Supervising physician immediately available to respond to emergencies See telemetry face sheet for immediately available ER MD    Location [facility]-Cardiac & Pulmonary Rehab    Staff Present Elige Ko, RCP,RRT,BSRT;Harlene Ramus, RN, BSN;Saran Laviolette, MA, RCEP, CCRP, CCET    Virtual Visit No    Medication changes reported     No    Fall or balance concerns reported    Yes    Tobacco Cessation No Change    Warm-up and Cool-down Performed on first and last piece of equipment    Resistance Training Performed Yes    VAD Patient? No    PAD/SET Patient? No      Pain Assessment   Currently in Pain? No/denies                Social History   Tobacco Use  Smoking Status Never  Smokeless Tobacco Never    Goals Met:  Proper associated with RPD/PD & O2 Sat Independence with exercise equipment Using PLB without cueing & demonstrates good technique Exercise tolerated well No report of concerns or symptoms today Strength training completed today  Goals Unmet:  Not Applicable  Comments: Pt able to follow exercise prescription today without complaint.  Will continue to monitor for progression.   6 Minute Walk     Row Name 09/28/22 0819         6 Minute Walk   Phase Discharge     Distance 1000 feet     Distance % Change 100 %     Distance Feet Change 500 ft     Walk Time 6 minutes     # of Rest Breaks 0     MPH 1.89     METS 3.15     RPE 17     Perceived Dyspnea  3     VO2 Peak 11.03     Symptoms Yes (comment)     Comments SOB, Fatigue     Resting HR 99 bpm     Resting BP 126/64     Resting Oxygen Saturation   94 %     Exercise Oxygen Saturation  during 6 min walk 95 %     Max Ex. HR 132 bpm     Max Ex. BP 132/74     2 Minute Post BP 126/60       Interval HR   1 Minute HR 124     2 Minute HR 132     3 Minute HR 127     4 Minute HR 117     5 Minute HR 122     6 Minute HR 128     2 Minute Post HR 107     Interval Heart Rate? Yes       Interval Oxygen   Interval Oxygen? Yes     Baseline Oxygen Saturation % 94 %     1 Minute Oxygen Saturation % 98 %     1 Minute Liters of Oxygen 2 L  CONTINUOUS     2 Minute Oxygen Saturation % 98 %     2  Minute Liters of Oxygen 2 L     3 Minute Oxygen Saturation % 98 %     3 Minute Liters of Oxygen 2 L     4 Minute Oxygen Saturation % 95 %     4 Minute Liters of Oxygen 2 L     5 Minute Oxygen Saturation % 95 %     5 Minute Liters of Oxygen 2 L     6 Minute Oxygen Saturation % 96 %     6 Minute Liters of Oxygen 2 L     2 Minute Post Oxygen Saturation % 97 %     2 Minute Post Liters of Oxygen 2 L               Dr. Bethann Punches is Medical Director for [facility] Cardiac Rehabilitation.  Dr. Vida Rigger is Medical Director for [facility] Pulmonary Rehabilitation.

## 2022-09-28 NOTE — Patient Instructions (Signed)
Discharge Patient Instructions  Patient Details  Name: Ruth White MRN: [id number] Date of Birth: August 06, 1960 Referring Provider:  Lezlie Lye, Meda Coffee, *   Number of Visits: 36  Reason for Discharge:  Patient reached a stable level of exercise. Patient independent in their exercise. Patient has met program and personal goals.  Smoking History:  Social History   Tobacco Use  Smoking Status Never  Smokeless Tobacco Never    Diagnosis:  Stage 3 severe COPD by GOLD classification  Initial Exercise Prescription:   Discharge Exercise Prescription (Final Exercise Prescription Changes):  Exercise Prescription Changes - 09/27/22 1300       Response to Exercise   Blood Pressure (Admit) 122/72    Blood Pressure (Exit) 102/64    Heart Rate (Admit) 96 bpm    Heart Rate (Exercise) 113 bpm    Heart Rate (Exit) 90 bpm    Oxygen Saturation (Admit) 98 %    Oxygen Saturation (Exercise) 91 %    Oxygen Saturation (Exit) 99 %    Rating of Perceived Exertion (Exercise) 15    Perceived Dyspnea (Exercise) 3    Symptoms SOB    Duration Continue with 30 min of aerobic exercise without signs/symptoms of physical distress.    Intensity THRR unchanged      Progression   Progression Continue to progress workloads to maintain intensity without signs/symptoms of physical distress.    Average METs 2.61      Resistance Training   Training Prescription Yes    Weight 2 lb    Reps 10-15      Interval Training   Interval Training No      Oxygen   Oxygen Continuous    Liters 2      Recumbant Bike   Level 2    Watts 24    Minutes 15    METs 3.02      NuStep   Level 3    Minutes 15    METs 2.9      T5 Nustep   Level 2    Minutes 15    METs 1.9      Biostep-RELP   Level 3    Minutes 15    METs 3      Track   Laps 15    Minutes 15    METs 1.82      Home Exercise Plan   Plans to continue exercise at Home (comment)   walking, recumbent bike, bands, weights    Frequency Add 2 additional days to program exercise sessions.    Initial Home Exercises Provided 04/05/22      Oxygen   Maintain Oxygen Saturation 88% or higher             Functional Capacity:  6 Minute Walk     Row Name 09/28/22 0819         6 Minute Walk   Phase Discharge     Distance 1000 feet     Distance % Change 100 %     Distance Feet Change 500 ft     Walk Time 6 minutes     # of Rest Breaks 0     MPH 1.89     METS 3.15     RPE 17     Perceived Dyspnea  3     VO2 Peak 11.03     Symptoms Yes (comment)     Comments SOB, Fatigue     Resting HR 99 bpm  Resting BP 126/64     Resting Oxygen Saturation  94 %     Exercise Oxygen Saturation  during 6 min walk 95 %     Max Ex. HR 132 bpm     Max Ex. BP 132/74     2 Minute Post BP 126/60       Interval HR   1 Minute HR 124     2 Minute HR 132     3 Minute HR 127     4 Minute HR 117     5 Minute HR 122     6 Minute HR 128     2 Minute Post HR 107     Interval Heart Rate? Yes       Interval Oxygen   Interval Oxygen? Yes     Baseline Oxygen Saturation % 94 %     1 Minute Oxygen Saturation % 98 %     1 Minute Liters of Oxygen 2 L  CONTINUOUS     2 Minute Oxygen Saturation % 98 %     2 Minute Liters of Oxygen 2 L     3 Minute Oxygen Saturation % 98 %     3 Minute Liters of Oxygen 2 L     4 Minute Oxygen Saturation % 95 %     4 Minute Liters of Oxygen 2 L     5 Minute Oxygen Saturation % 95 %     5 Minute Liters of Oxygen 2 L     6 Minute Oxygen Saturation % 96 %     6 Minute Liters of Oxygen 2 L     2 Minute Post Oxygen Saturation % 97 %     2 Minute Post Liters of Oxygen 2 L             Nutrition & Weight - Outcomes:   Post Biometrics - 09/28/22 0821        Post  Biometrics   Height 5' 3.5" (1.613 m)    Weight 160 lb 4.8 oz (72.7 kg)    Waist Circumference 34 inches    Hip Circumference 37.5 inches    Waist to Hip Ratio 0.91 %    BMI (Calculated) 27.95    Single Leg Stand 5.6 seconds            Goals reviewed with patient; copy given to patient.

## 2022-09-30 NOTE — Progress Notes (Incomplete)
[facility]  [address] [place],  [place]  [address] [phone]  Clinic Day:  03/30/2022  Referring physician: Luciano Cutter, MD  HISTORY OF PRESENT ILLNESS:  The patient is a 62 y.o. 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  She was placed on weekly paclitaxel/Herceptin for her adjuvant therapy.  However, due to significant peripheral neuropathy, her paclitaxel was discontinued a few weeks before the entire 12 weekly treatments were completed.  She completed 1 year of HER2 therapy in May 2022.  She also completed adjuvant breast radiation.  She takes anastrozole on a daily basis for her adjuvant endocrine therapy.  She comes in today for routine followup.  Since her last visit, the patient has been doing okay.  Her neuropathy remains prominent in her hands and feet.  She is currently being seen by pain management where she takes Lyrica, Cymbalta, and oxycodone as needed.  She has also been seen by neurology, who recommended that her neuropathy continue to be managed supportively.  Overall, the patient claims to be doing much better.  Despite her issues with neuropathy and COPD, her daily quality of life remains decent.  She denies having any particular changes with her breasts which concern her for disease recurrence.  PHYSICAL EXAM:  Last menstrual period 08/12/2020. Wt Readings from Last 3 Encounters:  09/28/22 160 lb 4.8 oz (72.7 kg)  07/27/22 164 lb (74.4 kg)  03/30/22 179 lb 12.8 oz (81.6 kg)   There is no height or weight on file to calculate BMI. Performance status (ECOG): 1 - Symptomatic but completely ambulatory Physical Exam Constitutional:      Appearance: Normal appearance.     Comments: She is now wearing oxygen per nasal canula  HENT:     Mouth/Throat:     Pharynx: Oropharynx is clear. No oropharyngeal exudate.  Cardiovascular:      Rate and Rhythm: Normal rate and regular rhythm.     Heart sounds: No murmur heard.    No friction rub. No gallop.  Pulmonary:     Breath sounds: Normal breath sounds.  Chest:  Breasts:    Right: No swelling, bleeding, inverted nipple, mass, nipple discharge or skin change.     Left: No swelling, bleeding, inverted nipple, mass, nipple discharge or skin change.  Abdominal:     General: Bowel sounds are normal. There is no distension.     Palpations: Abdomen is soft. There is no mass.     Tenderness: There is no abdominal tenderness.  Musculoskeletal:        General: No tenderness.     Cervical back: Normal range of motion and neck supple.     Right lower leg: No edema.     Left lower leg: No edema.  Lymphadenopathy:     Cervical: No cervical adenopathy.     Right cervical: No superficial, deep or posterior cervical adenopathy.    Left cervical: No superficial, deep or posterior cervical adenopathy.     Upper Body:     Right upper body: No supraclavicular or axillary adenopathy.     Left upper body: No supraclavicular or axillary adenopathy.     Lower Body: No right inguinal adenopathy. No left inguinal adenopathy.  Skin:    Coloration: Skin is not jaundiced.     Findings: No lesion or rash.  Neurological:     General: No focal deficit present.  Mental Status: She is alert and oriented to person, place, and time. Mental status is at baseline.  Psychiatric:        Mood and Affect: Mood is depressed.        Behavior: Behavior normal.        Thought Content: Thought content normal.        Judgment: Judgment normal.    ASSESSMENT & PLAN:  A 62 y.o. 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  Based upon her clinical breast exam today, the patient remains disease free.  She knows to continue taking her anastrozole daily for 5 total years of adjuvant endocrine therapy.  With respect to her painful neuropathy, she knows to stay on  the regimen that has been formulated per her pain management clinic.  I will see her back in 6 months for her next clinical breast exam.  Before that visit, the patient will undergo her annual mammogram for continued radiographic breast cancer surveillance.  She will also undergo a bone density study to ensure there has been no bone loss while on her adjuvant endocrine therapy.  The patient understands all the plans discussed today and is in agreement with them.    Daquawn Seelman Kirby Funk, MD

## 2022-09-30 NOTE — Progress Notes (Deleted)
[facility]  [address] [place],  [place]  [address] [phone]  Clinic Day:  03/30/2022  Referring physician: Luciano Cutter, MD  HISTORY OF PRESENT ILLNESS:  The patient is a 62 y.o. 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  She was placed on weekly paclitaxel/Herceptin for her adjuvant therapy.  However, due to significant peripheral neuropathy, her paclitaxel was discontinued a few weeks before the entire 12 weekly treatments were completed.  She completed 1 year of HER2 therapy in May 2022.  She also completed adjuvant breast radiation.  She takes anastrozole on a daily basis for her adjuvant endocrine therapy.  She comes in today for routine followup.  Since her last visit, the patient has been doing okay.  Her neuropathy remains prominent in her hands and feet.  She is currently being seen by pain management where she takes Lyrica, Cymbalta, and oxycodone as needed.  She has also been seen by neurology, who recommended that her neuropathy continue to be managed supportively.  Overall, the patient claims to be doing much better.  Despite her issues with neuropathy and COPD, her daily quality of life remains decent.  She denies having any particular changes with her breasts which concern her for disease recurrence.  PHYSICAL EXAM:  Last menstrual period 08/12/2020. Wt Readings from Last 3 Encounters:  09/28/22 160 lb 4.8 oz (72.7 kg)  07/27/22 164 lb (74.4 kg)  03/30/22 179 lb 12.8 oz (81.6 kg)   There is no height or weight on file to calculate BMI. Performance status (ECOG): 1 - Symptomatic but completely ambulatory Physical Exam Constitutional:      Appearance: Normal appearance.     Comments: She is now wearing oxygen per nasal canula  HENT:     Mouth/Throat:     Pharynx: Oropharynx is clear. No oropharyngeal exudate.  Cardiovascular:      Rate and Rhythm: Normal rate and regular rhythm.     Heart sounds: No murmur heard.    No friction rub. No gallop.  Pulmonary:     Breath sounds: Normal breath sounds.  Chest:  Breasts:    Right: No swelling, bleeding, inverted nipple, mass, nipple discharge or skin change.     Left: No swelling, bleeding, inverted nipple, mass, nipple discharge or skin change.  Abdominal:     General: Bowel sounds are normal. There is no distension.     Palpations: Abdomen is soft. There is no mass.     Tenderness: There is no abdominal tenderness.  Musculoskeletal:        General: No tenderness.     Cervical back: Normal range of motion and neck supple.     Right lower leg: No edema.     Left lower leg: No edema.  Lymphadenopathy:     Cervical: No cervical adenopathy.     Right cervical: No superficial, deep or posterior cervical adenopathy.    Left cervical: No superficial, deep or posterior cervical adenopathy.     Upper Body:     Right upper body: No supraclavicular or axillary adenopathy.     Left upper body: No supraclavicular or axillary adenopathy.     Lower Body: No right inguinal adenopathy. No left inguinal adenopathy.  Skin:    Coloration: Skin is not jaundiced.     Findings: No lesion or rash.  Neurological:     General: No focal deficit present.  Mental Status: She is alert and oriented to person, place, and time. Mental status is at baseline.  Psychiatric:        Mood and Affect: Mood is depressed.        Behavior: Behavior normal.        Thought Content: Thought content normal.        Judgment: Judgment normal.    ASSESSMENT & PLAN:  A 61 y.o. 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  Based upon her clinical breast exam today, the patient remains disease free.  She knows to continue taking her anastrozole daily for 5 total years of adjuvant endocrine therapy.  With respect to her painful neuropathy, she knows to stay on  the regimen that has been formulated per her pain management clinic.  I will see her back in 6 months for her next clinical breast exam.  Before that visit, the patient will undergo her annual mammogram for continued radiographic breast cancer surveillance.  She will also undergo a bone density study to ensure there has been no bone loss while on her adjuvant endocrine therapy.  The patient understands all the plans discussed today and is in agreement with them.    Kelsy Polack A Tiffony Kite, MD      

## 2022-10-01 ENCOUNTER — Telehealth: Payer: Self-pay | Admitting: Oncology

## 2022-10-01 ENCOUNTER — Other Ambulatory Visit: Payer: Self-pay | Admitting: Oncology

## 2022-10-01 ENCOUNTER — Inpatient Hospital Stay: Payer: PPO | Attending: Oncology | Admitting: Oncology

## 2022-10-01 VITALS — BP 126/78 | HR 84 | Temp 98.1°F | Resp 14 | Ht 64.0 in | Wt 156.2 lb

## 2022-10-01 DIAGNOSIS — C50012 Malignant neoplasm of nipple and areola, left female breast: Secondary | ICD-10-CM

## 2022-10-01 NOTE — Telephone Encounter (Signed)
10/01/22 Next appt scheduled and confirmed with patient 

## 2022-10-01 NOTE — Progress Notes (Signed)
[facility]  [address] [place],  [place]  [address] [phone]  Clinic Day:  10/01/2022  Referring physician: Luciano Cutter, MD   HISTORY OF PRESENT ILLNESS:  The patient is a 62 y.o. 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  She was placed on weekly paclitaxel/Herceptin for her adjuvant therapy.  However, due to significant peripheral neuropathy, her paclitaxel was discontinued a few weeks before the entire 12 weekly treatments were completed.  She completed 1 year of HER2 therapy in May 2022.  She also completed adjuvant breast radiation.  She takes anastrozole on a daily basis for her adjuvant endocrine therapy.  She comes in today for routine followup.  Since her last visit, the patient has been doing okay.  Her neuropathy remains prominent in her hands and feet.  She is currently being seen by pain management for her neuropathic pain.   She also has COPD, for which she is on oxygen chronically.  From a breast cancer perspective, she denies having any particular changes with her breasts which concern her for disease recurrence.  Of note, her annual mammogram in February 2024 showed no evidence of disease recurrence.  He bone density study did show evidence of osteoporosis.   PHYSICAL EXAM:  Blood pressure 126/78, pulse 84, temperature 98.1 F (36.7 C), resp. rate 14, height 5\' 4"  (1.626 m), weight 156 lb 3.2 oz (70.9 kg), last menstrual period 08/12/2020, SpO2 98 %. Wt Readings from Last 3 Encounters:  10/01/22 156 lb 3.2 oz (70.9 kg)  09/28/22 160 lb 4.8 oz (72.7 kg)  07/27/22 164 lb (74.4 kg)   Body mass index is 26.81 kg/m. Performance status (ECOG): 2 - Symptomatic, <50% confined to bed Physical Exam Constitutional:      Appearance: Normal appearance. She is not ill-appearing.     Comments: She is ambulating with a cane and wearing oxygen per  nasal canula   HENT:     Mouth/Throat:     Mouth: Mucous membranes are moist.     Pharynx: Oropharynx is clear. No oropharyngeal exudate or posterior oropharyngeal erythema.  Cardiovascular:     Rate and Rhythm: Normal rate and regular rhythm.     Heart sounds: No murmur heard.    No friction rub. No gallop.  Pulmonary:     Effort: Pulmonary effort is normal. No respiratory distress.     Breath sounds: Decreased air movement present. Decreased breath sounds present. No wheezing, rhonchi or rales.  Chest:  Breasts:    Right: No swelling, bleeding, inverted nipple, mass, nipple discharge or skin change.     Left: No swelling, bleeding, inverted nipple, mass, nipple discharge or skin change.  Abdominal:     General: Bowel sounds are normal. There is no distension.     Palpations: Abdomen is soft. There is no mass.     Tenderness: There is no abdominal tenderness.  Musculoskeletal:        General: No swelling or tenderness.     Cervical back: Normal range of motion and neck supple.     Right lower leg: No edema.     Left lower leg: No edema.  Lymphadenopathy:     Cervical: No cervical adenopathy.     Right cervical: No superficial, deep or posterior cervical adenopathy.    Left cervical: No superficial, deep or posterior cervical adenopathy.     Upper Body:  Right upper body: No supraclavicular or axillary adenopathy.     Left upper body: No supraclavicular or axillary adenopathy.     Lower Body: No right inguinal adenopathy. No left inguinal adenopathy.  Skin:    General: Skin is warm.     Coloration: Skin is not jaundiced.     Findings: No lesion or rash.  Neurological:     General: No focal deficit present.     Mental Status: She is alert and oriented to person, place, and time. Mental status is at baseline.  Psychiatric:        Mood and Affect: Mood normal.        Behavior: Behavior normal.        Thought Content: Thought content normal.        Judgment: Judgment  normal.    LABS:      Latest Ref Rng & Units 07/05/2020   12:00 AM 03/18/2020   12:00 AM  CBC  WBC  3.6     2.7      Hemoglobin 12.0 - 16.0 11.6     11.3      Hematocrit 36 - 46 35     35      Platelets 150 - 399 186     182         This result is from an external source.      Latest Ref Rng & Units 07/05/2020   12:00 AM 03/18/2020   12:00 AM  CMP  BUN 4 - 21 26     16       Creatinine 0.5 - 1.1 1.5     1.0      Sodium 137 - 147 139     140      Potassium 3.4 - 5.3 4.2     4.1      Chloride 99 - 108 105     107      CO2 13 - 22 25       Calcium 8.7 - 10.7 9.7     9.6      Alkaline Phos 25 - 125 57     49      AST 13 - 35 37     31      ALT 7 - 35 27     26         This result is from an external source.   ASSESSMENT & PLAN:  Assessment/Plan:  A 62 y.o. 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  Based upon her clinical breast exam today and her recent mammogram, the patient remains disease free.  She knows to continue taking her anastrozole daily for her 5 total years of adjuvant endocrine therapy.  With respect to her painful neuropathy, she knows to stay on the regimen that has been formulated per her pain management clinic.  As she has osteoporosis, she will be placed on bisphosphonate therapy to prevent worsening bone disease over  time.  Furthermore, she knows to take a daily allotment of calcium and vitamin D to maintain satisfactory bone health.  Otherwise, I will see her back in 6 months for her next clinical breast exam.  The patient understands all the plans discussed today and is in agreement with them.    Kambrey Hagger Kirby Funk, MD

## 2022-10-02 ENCOUNTER — Encounter: Payer: PPO | Admitting: *Deleted

## 2022-10-02 DIAGNOSIS — J449 Chronic obstructive pulmonary disease, unspecified: Secondary | ICD-10-CM | POA: Diagnosis not present

## 2022-10-02 NOTE — Progress Notes (Signed)
Daily Session Note  Patient Details  Name: Ruth White MRN: [id number] Date of Birth: 12/08/1960 Referring Provider:   Flowsheet Row Pulmonary Rehab from 02/26/2022 in [facility]  Referring Provider Luciano Cutter MD       Encounter Date: 10/02/2022  Check In:  Session Check In - 10/02/22 0807       Check-In   Supervising physician immediately available to respond to emergencies See telemetry face sheet for immediately available ER MD    Staff Present Cyndia Diver, RN, BSN, Damita Dunnings, MA, RCEP, CCRP, Zackery Barefoot, MS, ACSM CEP, Exercise Physiologist    Virtual Visit No    Medication changes reported     No    Fall or balance concerns reported    No    Tobacco Cessation No Change    Warm-up and Cool-down Performed on first and last piece of equipment    Resistance Training Performed Yes    VAD Patient? No    PAD/SET Patient? No      Pain Assessment   Currently in Pain? No/denies                Social History   Tobacco Use  Smoking Status Never  Smokeless Tobacco Never    Goals Met:  Independence with exercise equipment Exercise tolerated well No report of concerns or symptoms today  Goals Unmet:  Not Applicable  Comments: Pt able to follow exercise prescription today without complaint.  Will continue to monitor for progression.    Dr. Bethann Punches is Medical Director for [facility] Cardiac Rehabilitation.  Dr. Vida Rigger is Medical Director for [facility] Pulmonary Rehabilitation.

## 2022-10-03 ENCOUNTER — Encounter: Payer: Self-pay | Admitting: *Deleted

## 2022-10-03 DIAGNOSIS — J449 Chronic obstructive pulmonary disease, unspecified: Secondary | ICD-10-CM

## 2022-10-03 NOTE — Progress Notes (Signed)
Pulmonary Individual Treatment Plan  Patient Details  Name: Ruth White MRN: [id number] Date of Birth: June 10, 1961 Referring Provider:   Flowsheet Row Pulmonary Rehab from 02/26/2022 in [facility] Cardiac and Pulmonary Rehab  Referring Provider Luciano Cutter MD       Initial Encounter Date:  Flowsheet Row Pulmonary Rehab from 02/26/2022 in [facility] Cardiac and Pulmonary Rehab  Date 02/26/22       Visit Diagnosis: Stage 3 severe COPD by GOLD classification  Patient's Home Medications on Admission:  Current Outpatient Medications:    ACCU-CHEK GUIDE test strip, 1 (ONE) EACH DAILY AND AS NEEDED FOR SYMPTOMS, Disp: , Rfl:    Accu-Chek Softclix Lancets lancets, USE 1 LANCET DAILY AS DIRECTED, Disp: , Rfl:    anastrozole (ARIMIDEX) 1 MG tablet, TAKE 1 TABLET BY MOUTH EVERY DAY TO PREVENT FUTURE DISEASE RECURRENCE, Disp: 90 tablet, Rfl: 3   aspirin 325 MG EC tablet, Take 325 mg by mouth daily., Disp: , Rfl:    atenolol (TENORMIN) 50 MG tablet, Take 50 mg by mouth daily., Disp: , Rfl:    BD PEN NEEDLE NANO 2ND GEN 32G X 4 MM MISC, 1 (ONE) PEN NEEDLE USE DAILY, Disp: , Rfl:    Blood Glucose Monitoring Suppl (ACCU-CHEK GUIDE ME) w/Device KIT, 1 (ONE) KIT CHECK BLOOD GLUCOSE DAILY, Disp: , Rfl:    BREZTRI AEROSPHERE 160-9-4.8 MCG/ACT AERO, Inhale 2 puffs into the lungs in the morning and at bedtime., Disp: 10.7 g, Rfl: 5   cyclobenzaprine (FLEXERIL) 10 MG tablet, Take 1 tablet by mouth 2 (two) times daily as needed., Disp: , Rfl:    diclofenac Sodium (VOLTAREN) 1 % GEL, Apply topically., Disp: , Rfl:    Evolocumab (REPATHA SURECLICK) 140 MG/ML SOAJ, Inject 1 mL into the skin every 14 (fourteen) days., Disp: 2 mL, Rfl: 11   ezetimibe (ZETIA) 10 MG tablet, Take 10 mg by mouth at bedtime., Disp: , Rfl:    FARXIGA 10 MG TABS tablet, Take 10 mg by mouth every morning., Disp: , Rfl:    glucose blood (ACCU-CHEK GUIDE) test strip, 1 (ONE) EACH DAILY AND AS NEEDED FOR SYMPTOMS, Disp: , Rfl:     ibuprofen (ADVIL) 800 MG tablet, Take 800 mg by mouth every 8 (eight) hours as needed., Disp: , Rfl:    lactulose, encephalopathy, (CHRONULAC) 10 GM/15ML SOLN, Take 15 mLs (10 g total) by mouth daily., Disp: 473 mL, Rfl: 2   lisinopril (ZESTRIL) 20 MG tablet, Take 20 mg by mouth 2 (two) times daily., Disp: , Rfl:    magic mouthwash SOLN, Take 5 mLs by mouth., Disp: , Rfl:    metFORMIN (GLUCOPHAGE-XR) 500 MG 24 hr tablet, Take 500 mg by mouth daily., Disp: , Rfl:    naloxone (NARCAN) nasal spray 4 mg/0.1 mL, Use as directed for overdose of narcotic medication, Disp: 1 each, Rfl: 2   NUCYNTA 50 MG tablet, Take 50 mg by mouth 4 (four) times daily., Disp: , Rfl:    ondansetron (ZOFRAN) 4 MG tablet, Take 4 mg by mouth every 8 (eight) hours as needed for nausea or vomiting., Disp: , Rfl:    Oxycodone HCl 10 MG TABS, Take 0.5 tablets (5 mg total) by mouth every 4 (four) hours as needed., Disp: 120 tablet, Rfl: 0   pregabalin (LYRICA) 75 MG capsule, TAKE 1 CAPSULE BY MOUTH TWICE A DAY, Disp: 180 capsule, Rfl: 0   prochlorperazine (COMPAZINE) 10 MG tablet, Take 1 tablet (10 mg total) by mouth every 6 (six) hours  as needed for nausea or vomiting., Disp: 30 tablet, Rfl: 1   rosuvastatin (CRESTOR) 40 MG tablet, Take 40 mg by mouth daily., Disp: , Rfl:    senna (SENOKOT) 8.6 MG tablet, Take 1 tablet by mouth daily. 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  Minimally important difference is 5 units  CAT: CAT can identify the health impairment of COPD patients and is better correlated with disease progression.  CAT has a scoring range of zero to 40. The CAT score is classified into four groups of low (less than 10), medium (10 - 20), high (21-30) and very high (31-40) based on the impact level of disease on health status. A CAT score over 10 suggests significant symptoms.  A worsening CAT score could be explained by an exacerbation, poor medication adherence, poor inhaler technique, or progression of COPD or comorbid conditions.  CAT MCID is 2 points  mMRC: mMRC (Modified Medical Research Council) Dyspnea Scale is used to assess the degree of baseline functional disability in patients of respiratory disease due to dyspnea. No minimal important difference is established. A decrease in score of 1 point or greater is considered a positive change.   Pulmonary Function Assessment:   Exercise Target Goals: Exercise Program Goal: Individual exercise prescription set using results from initial 6 min walk test and THRR while considering  patient's activity barriers and safety.   Exercise Prescription Goal: Initial exercise prescription builds to 30-45 minutes a day of aerobic activity, 2-3 days per week.  Home exercise guidelines will be given to patient during program as part of exercise prescription that the participant will  acknowledge.  Education: Aerobic Exercise: - Group verbal and visual presentation on the components of exercise prescription. Introduces F.I.T.T principle from ACSM for exercise prescriptions.  Reviews F.I.T.T. principles of aerobic exercise including progression. Written material given at graduation. Flowsheet Row Pulmonary Rehab from 09/20/2022 in [facility] Cardiac and Pulmonary Rehab  Date 09/13/22  Educator [facility]  Instruction Review Code 1- Verbalizes Understanding       Education: Resistance Exercise: - Group verbal and visual presentation on the components of exercise prescription. Introduces F.I.T.T principle from ACSM for exercise prescriptions  Reviews F.I.T.T. principles of resistance exercise  including progression. Written material given at graduation. Flowsheet Row Pulmonary Rehab from 09/13/2022 in [facility] Cardiac and Pulmonary Rehab  Date 03/01/22  Educator NT  Instruction Review Code 1- [employer] Understanding        Education: Exercise & Equipment Safety: - Individual verbal instruction and demonstration of equipment use and safety with use of the equipment. Flowsheet Row Pulmonary Rehab from 09/20/2022 in [facility] Cardiac and Pulmonary Rehab  Date 02/26/22  Educator [facility]  Instruction Review Code 1- Verbalizes Understanding       Education: Exercise Physiology & General Exercise Guidelines: - Group verbal and written instruction with models to review the exercise physiology of the cardiovascular system and associated critical values. Provides general exercise guidelines with specific guidelines to those with heart or lung disease.  Flowsheet Row Pulmonary Rehab from 09/20/2022 in [facility] Cardiac and Pulmonary Rehab  Date 09/06/22  Educator [facility]  Instruction Review Code 1- Verbalizes Understanding       Education: Flexibility, Balance, Mind/Body Relaxation: - Group verbal and visual presentation with interactive activity on the components of exercise prescription. Introduces F.I.T.T  principle from ACSM for exercise prescriptions. Reviews F.I.T.T. principles of flexibility and balance exercise training including progression. Also discusses the mind body connection.  Reviews various relaxation techniques to help reduce and manage stress (i.e. Deep breathing, progressive muscle relaxation, and visualization). Balance handout provided to take home. Written material given at graduation. Flowsheet Row Pulmonary Rehab from 09/13/2022 in [facility] Cardiac and Pulmonary Rehab  Date 03/01/22  Educator NT  Instruction Review Code 1- Verbalizes Understanding       Activity Barriers & Risk Stratification:   6 Minute Walk:  6 Minute Walk     Row Name 09/28/22 0819         6 Minute Walk   Phase Discharge     Distance 1000 feet     Distance % Change 100 %     Distance Feet Change 500 ft     Walk Time 6 minutes     # of Rest Breaks 0     MPH 1.89     METS 3.15     RPE 17     Perceived Dyspnea  3     VO2 Peak 11.03     Symptoms Yes (comment)     Comments SOB, Fatigue     Resting HR 99 bpm     Resting BP 126/64     Resting Oxygen Saturation  94 %     Exercise Oxygen Saturation  during 6 min walk 95 %     Max Ex. HR 132 bpm     Max Ex. BP 132/74     2 Minute Post BP 126/60       Interval HR   1 Minute HR 124     2 Minute HR 132     3 Minute HR 127     4 Minute HR 117     5 Minute HR 122     6 Minute HR 128     2 Minute Post HR 107     Interval Heart Rate? Yes       Interval Oxygen   Interval Oxygen? Yes     Baseline Oxygen Saturation % 94 %     1 Minute Oxygen Saturation % 98 %     1 Minute Liters of Oxygen 2 L  CONTINUOUS     2 Minute Oxygen Saturation % 98 %     2 Minute Liters of  Oxygen 2 L     3 Minute Oxygen Saturation % 98 %     3 Minute Liters of Oxygen 2 L     4 Minute Oxygen Saturation % 95 %     4 Minute Liters of Oxygen 2 L     5 Minute Oxygen Saturation % 95 %     5 Minute Liters of Oxygen 2 L     6 Minute Oxygen Saturation % 96 %     6 Minute  Liters of Oxygen 2 L     2 Minute Post Oxygen Saturation % 97 %     2 Minute Post Liters of Oxygen 2 L             Oxygen Initial Assessment:   Oxygen Re-Evaluation:  Oxygen Re-Evaluation     Row Name 05/01/22 0756 05/31/22 0809 08/16/22 0833 09/11/22 0828       Program Oxygen Prescription   Program Oxygen Prescription Continuous;E-Tanks Continuous;E-Tanks Continuous;E-Tanks Continuous;E-Tanks    Liters per minute 2 2 2 2       Home Oxygen   Home Oxygen Device E-Tanks;Home Concentrator E-Tanks;Home Concentrator E-Tanks;Home Concentrator E-Tanks;Home Concentrator    Sleep Oxygen Prescription Continuous Continuous Continuous Continuous    Liters per minute 2 2 2 2     Home Exercise Oxygen Prescription Continuous Continuous Continuous Continuous    Liters per minute 2 2 2 2     Home Resting Oxygen Prescription None None None None    Compliance with Home Oxygen Use Yes Yes Yes Yes      Goals/Expected Outcomes   Short Term Goals To learn and demonstrate proper pursed lip breathing techniques or other breathing techniques. ;To learn and exhibit compliance with exercise, home and travel O2 prescription;To learn and understand importance of monitoring SPO2 with pulse oximeter and demonstrate accurate use of the pulse oximeter.;To learn and understand importance of maintaining oxygen saturations>88%;To learn and demonstrate proper use of respiratory medications To learn and demonstrate proper pursed lip breathing techniques or other breathing techniques. ;To learn and exhibit compliance with exercise, home and travel O2 prescription;To learn and understand importance of monitoring SPO2 with pulse oximeter and demonstrate accurate use of the pulse oximeter.;To learn and understand importance of maintaining oxygen saturations>88%;To learn and demonstrate proper use of respiratory medications To learn and demonstrate proper pursed lip breathing techniques or other breathing techniques. ;To learn and  exhibit compliance with exercise, home and travel O2 prescription;To learn and understand importance of monitoring SPO2 with pulse oximeter and demonstrate accurate use of the pulse oximeter.;To learn and understand importance of maintaining oxygen saturations>88%;To learn and demonstrate proper use of respiratory medications To learn and demonstrate proper pursed lip breathing techniques or other breathing techniques. ;To learn and exhibit compliance with exercise, home and travel O2 prescription;To learn and understand importance of monitoring SPO2 with pulse oximeter and demonstrate accurate use of the pulse oximeter.;To learn and understand importance of maintaining oxygen saturations>88%;To learn and demonstrate proper use of respiratory medications    Long  Term Goals Exhibits proper breathing techniques, such as pursed lip breathing or other method taught during program session;Maintenance of O2 saturations>88%;Exhibits compliance with exercise, home  and travel O2 prescription;Compliance with respiratory medication;Verbalizes importance of monitoring SPO2 with pulse oximeter and return demonstration;Demonstrates proper use of MDI's Exhibits proper breathing techniques, such as pursed lip breathing or other method taught during program session;Maintenance of O2 saturations>88%;Exhibits compliance with exercise, home  and travel O2 prescription;Compliance with respiratory medication;Verbalizes importance of monitoring SPO2  with pulse oximeter and return demonstration;Demonstrates proper use of MDI's Exhibits proper breathing techniques, such as pursed lip breathing or other method taught during program session;Maintenance of O2 saturations>88%;Exhibits compliance with exercise, home  and travel O2 prescription;Compliance with respiratory medication;Verbalizes importance of monitoring SPO2 with pulse oximeter and return demonstration;Demonstrates proper use of MDI's Exhibits proper breathing techniques, such  as pursed lip breathing or other method taught during program session;Maintenance of O2 saturations>88%;Exhibits compliance with exercise, home  and travel O2 prescription;Compliance with respiratory medication;Verbalizes importance of monitoring SPO2 with pulse oximeter and return demonstration;Demonstrates proper use of MDI's    Comments Ruth White is doing well, she did miss some time after a fall.  She is good about using her PLB and practices it routinely at home.  She is good about wearing her oxygen for activity.  She continues to keep an eye on her saturations as well. Ruth White is doing well with her breathing.  She is compliant with her PLB and oxygen therapy.  She continues to keep an eye on her saturations at home.  While exercising this week at home she did drop to 91% on the bike. Ruth White is doing well back in rehab.  She has been doing pretty good with her breathing.  She has good days and bad days.  On the bad breathing days, she will use her home oxygen more.  She conitnues to use her PLB routinely.  She is using her nebulizer and inhaler routinely to manage her pulmonary disease.  She is monitoring her saturations and they have been good. Ruth White is doing well in rehab.  She is doing well with her oxygen use at home.  She is good about using her oxygen at home.  She did note that her son pushed too hard one day, but then she rested with her oxygen on and was good.  She is good about checking her saturations and using her meds.    Goals/Expected Outcomes Short: Conitneu to work on PLB Long: Conitnue to monitor lungs short; Continue to use PLB  Long; Continue to manage pulmonary disease Short: Continue to use oxygen to help with breathing Long: conitnue to use PLB routinely Short: Conitnue to use PLB routinely Long: Continue to stay compliant with oxygen at home             Oxygen Discharge (Final Oxygen Re-Evaluation):  Oxygen Re-Evaluation - 09/11/22 0828       Program Oxygen Prescription    Program Oxygen Prescription Continuous;E-Tanks    Liters per minute 2      Home Oxygen   Home Oxygen Device E-Tanks;Home Concentrator    Sleep Oxygen Prescription Continuous    Liters per minute 2    Home Exercise Oxygen Prescription Continuous    Liters per minute 2    Home Resting Oxygen Prescription None    Compliance with Home Oxygen Use Yes      Goals/Expected Outcomes   Short Term Goals To learn and demonstrate proper pursed lip breathing techniques or other breathing techniques. ;To learn and exhibit compliance with exercise, home and travel O2 prescription;To learn and understand importance of monitoring SPO2 with pulse oximeter and demonstrate accurate use of the pulse oximeter.;To learn and understand importance of maintaining oxygen saturations>88%;To learn and demonstrate proper use of respiratory medications    Long  Term Goals Exhibits proper breathing techniques, such as pursed lip breathing or other method taught during program session;Maintenance of O2 saturations>88%;Exhibits compliance with exercise, home  and travel O2 prescription;Compliance  with respiratory medication;Verbalizes importance of monitoring SPO2 with pulse oximeter and return demonstration;Demonstrates proper use of MDI's    Comments Ruth White is doing well in rehab.  She is doing well with her oxygen use at home.  She is good about using her oxygen at home.  She did note that her son pushed too hard one day, but then she rested with her oxygen on and was good.  She is good about checking her saturations and using her meds.    Goals/Expected Outcomes Short: Conitnue to use PLB routinely Long: Continue to stay compliant with oxygen at home             Initial Exercise Prescription:   Perform Capillary Blood Glucose checks as needed.  Exercise Prescription Changes:   Exercise Prescription Changes     Row Name 04/10/22 1300 04/25/22 1000 05/08/22 1300 05/22/22 1400 06/05/22 1400     Response to Exercise    Blood Pressure (Admit) 116/64 98/60 112/74 112/64 102/60   Blood Pressure (Exercise) 136/64 114/64 -- -- --   Blood Pressure (Exit) 122/60 94/62 122/72 102/60 112/64   Heart Rate (Admit) 83 bpm 74 bpm 89 bpm 78 bpm 109 bpm   Heart Rate (Exercise) 112 bpm 109 bpm 102 bpm 107 bpm 140 bpm   Heart Rate (Exit) 95 bpm 89 bpm 94 bpm 82 bpm 116 bpm   Oxygen Saturation (Admit) 97 % 99 % 99 % 98 % 98 %   Oxygen Saturation (Exercise) 92 % 96 % 96 % 97 % 96 %   Oxygen Saturation (Exit) 97 % 97 % 98 % 98 % 97 %   Rating of Perceived Exertion (Exercise) 13 15 15 15 15    Perceived Dyspnea (Exercise) 2 3 3 3 3    Symptoms SOB, fatigue SOB SOB SOB SOB   Duration Progress to 30 minutes of  aerobic without signs/symptoms of physical distress Continue with 30 min of aerobic exercise without signs/symptoms of physical distress. Continue with 30 min of aerobic exercise without signs/symptoms of physical distress. Continue with 30 min of aerobic exercise without signs/symptoms of physical distress. Continue with 30 min of aerobic exercise without signs/symptoms of physical distress.   Intensity THRR unchanged THRR unchanged THRR unchanged THRR unchanged THRR unchanged     Progression   Progression Continue to progress workloads to maintain intensity without signs/symptoms of physical distress. Continue to progress workloads to maintain intensity without signs/symptoms of physical distress. Continue to progress workloads to maintain intensity without signs/symptoms of physical distress. Continue to progress workloads to maintain intensity without signs/symptoms of physical distress. Continue to progress workloads to maintain intensity without signs/symptoms of physical distress.   Average METs 2.21 2.5 2.27 1.88 2.29     Resistance Training   Training Prescription Yes Yes Yes Yes Yes   Weight 3 lb 3 lb 2 lb 3 lb 3 lb   Reps 10-15 10-15 10-15 10-15 10-15     Interval Training   Interval Training No No -- No No      Oxygen   Oxygen Continuous Continuous Continuous Continuous Continuous   Liters 2 2 2 2 2      Recumbant Bike   Level 1 -- 1 -- 2   Watts 19 -- -- -- --   Minutes 15 -- 15 -- 15   METs 2.73 -- 2.51 -- 1.52     NuStep   Level 1 3 3 3  --   Minutes 15 15 30 15  --   METs --  2.8 2.3 -- --     T5 Nustep   Level 1 -- 1 -- 2   Minutes 15 -- 15 -- 15   METs 1.9 -- -- -- 1.8     Biostep-RELP   Level 1 2 1 1 3    SPM -- 50 -- -- --   Minutes 15 15 15 15 15    METs 2 2 2 2 3      Track   Laps -- 11 -- 10 15   Minutes -- 15 -- 15 15   METs -- 1.6 -- 1.54 1.82     Home Exercise Plan   Plans to continue exercise at Home (comment)  walking, recumbent bike, bands, weights Home (comment)  walking, recumbent bike, bands, weights Home (comment)  walking, recumbent bike, bands, weights Home (comment)  walking, recumbent bike, bands, weights Home (comment)  walking, recumbent bike, bands, weights   Frequency Add 2 additional days to program exercise sessions. Add 2 additional days to program exercise sessions. Add 2 additional days to program exercise sessions. Add 2 additional days to program exercise sessions. Add 2 additional days to program exercise sessions.   Initial Home Exercises Provided 04/05/22 04/05/22 04/05/22 04/05/22 04/05/22     Oxygen   Maintain Oxygen Saturation 88% or higher 88% or higher 88% or higher 88% or higher 88% or higher    Row Name 08/13/22 1500 08/28/22 1400 09/11/22 1400 09/27/22 1300       Response to Exercise   Blood Pressure (Admit) 124/72 110/64 120/68 122/72    Blood Pressure (Exit) 124/72 108/66 104/60 102/64    Heart Rate (Admit) 77 bpm 100 bpm 85 bpm 96 bpm    Heart Rate (Exercise) 94 bpm 116 bpm 96 bpm 113 bpm    Heart Rate (Exit) 88 bpm 92 bpm 84 bpm 90 bpm    Oxygen Saturation (Admit) 99 % 98 % 99 % 98 %    Oxygen Saturation (Exercise) 97 % 96 % 97 % 91 %    Oxygen Saturation (Exit) 98 % 98 % 99 % 99 %    Rating of Perceived Exertion (Exercise) 14  13 15 15     Perceived Dyspnea (Exercise) 2 2 2 3     Symptoms SOB SOB SOB SOB    Comments return back since December -- -- --    Duration Continue with 30 min of aerobic exercise without signs/symptoms of physical distress. Continue with 30 min of aerobic exercise without signs/symptoms of physical distress. Continue with 30 min of aerobic exercise without signs/symptoms of physical distress. Continue with 30 min of aerobic exercise without signs/symptoms of physical distress.    Intensity THRR unchanged THRR unchanged THRR unchanged THRR unchanged      Progression   Progression Continue to progress workloads to maintain intensity without signs/symptoms of physical distress. Continue to progress workloads to maintain intensity without signs/symptoms of physical distress. Continue to progress workloads to maintain intensity without signs/symptoms of physical distress. Continue to progress workloads to maintain intensity without signs/symptoms of physical distress.    Average METs 2.28 2.55 2.26 2.61      Resistance Training   Training Prescription Yes Yes Yes Yes    Weight 3 lb 4 lb 4 lb 2 lb    Reps 10-15 10-15 10-15 10-15      Interval Training   Interval Training No No No No      Oxygen   Oxygen Continuous Continuous Continuous Continuous    Liters 2 2 2  2      Recumbant Bike   Level 2 2 2 2     Watts 19 25 19 24     Minutes 15 15 15 15     METs 2.79 3.06 2.79 3.02      NuStep   Level 3 -- 1 3    Minutes 15 -- 15 15    METs 2.6 -- 2 2.9      T5 Nustep   Level 1 2 -- 2    Minutes 15 15 -- 15    METs 2 2.1 -- 1.9      Biostep-RELP   Level -- 1 1 3     Minutes -- 15 15 15     METs -- 3.1 2 3       Track   Laps 14 25 -- 15    Minutes 15 15 -- 15    METs 1.76 2.36 -- 1.82      Home Exercise Plan   Plans to continue exercise at Home (comment)  walking, recumbent bike, bands, weights Home (comment)  walking, recumbent bike, bands, weights Home (comment)  walking, recumbent bike,  bands, weights Home (comment)  walking, recumbent bike, bands, weights    Frequency Add 2 additional days to program exercise sessions. Add 2 additional days to program exercise sessions. Add 2 additional days to program exercise sessions. Add 2 additional days to program exercise sessions.    Initial Home Exercises Provided 04/05/22 04/05/22 04/05/22 04/05/22      Oxygen   Maintain Oxygen Saturation 88% or higher 88% or higher 88% or higher 88% or higher             Exercise Comments:   Exercise Goals and Review:   Exercise Goals Re-Evaluation :  Exercise Goals Re-Evaluation     Row Name 04/10/22 1402 04/25/22 1107 05/01/22 0737 05/08/22 1347 05/22/22 1454     Exercise Goal Re-Evaluation   Exercise Goals Review Increase Physical Activity;Increase Strength and Stamina;Understanding of Exercise Prescription Increase Physical Activity;Increase Strength and Stamina;Understanding of Exercise Prescription Increase Physical Activity;Increase Strength and Stamina;Understanding of Exercise Prescription Increase Physical Activity;Increase Strength and Stamina;Understanding of Exercise Prescription Increase Physical Activity;Increase Strength and Stamina;Understanding of Exercise Prescription   Comments Ruth White is doing well in rehab. While she has not increased her workloads on any machines yet, she has increased her average MET level to 2.21 METs. She also has tolerated using 3 lb weights for resistance training. We will continue to monitor her progress in the program. Ruth White continues to do well in rehab. She was able to increase her workload to level 3 on the T4 Nustep for part of the time. It was challenging for her but happy she was able to do it. She also walked 11 laps on the track which was her first time walking. We will continue to monitor. Ruth White missed the last couple of weeks due to family medical concerns and then a fall at home.  Prior to her fall she was using her bike for 20 min each  day and 10 min worth of weights each day.  She  has also been working on her breathing too. Ruth White is doing well in rehab since returning from her fall. She was able to tolerate the T4 at level 3 for 30 minutes. She also was able to work at an average overall MET level of 2.27 METs. She did decrease her hand weights from 3 lb to 1 lb hand weights. We will continue to monitor her progress in  the program. Ruth White is slowing getting back into the routine after she was out for a week or so. Ruth White was able to get 10 laps on the track and hope to see more walking incorporated into her exercise regimen. She is back up to 3 lbs with her handweights and staying consistent at level 3 on the T4 Nustep. She would benefit from increasing her Biostep to level 2. Will continue to monitor.   Expected Outcomes Short: Begin to increase workloads on seated machines. Long: Continue to increase strength and stamina. Short: Work on T4 Nustep at level 3 the entire 15 minutes Long: Continue to increase overall MET level Short: Get back to exercise routine again Long: Continue to improve stamina Short: Continue to try walking more. Long: Continue to increase strength and stamina. Short: Increase to level 2 for Biostep Long: Continue to increase overall MET level    Row Name 05/31/22 0751 06/05/22 1443 06/20/22 1127 07/03/22 0846 07/16/22 1543     Exercise Goal Re-Evaluation   Exercise Goals Review Increase Physical Activity;Increase Strength and Stamina;Understanding of Exercise Prescription Increase Physical Activity;Increase Strength and Stamina;Understanding of Exercise Prescription Increase Physical Activity;Increase Strength and Stamina;Understanding of Exercise Prescription Increase Physical Activity;Increase Strength and Stamina;Understanding of Exercise Prescription Increase Physical Activity;Increase Strength and Stamina;Understanding of Exercise Prescription   Comments Ruth White is doing well in rehab.  She is riding her recumbent  bike at home for 20 min and then weights and back to bike again.  She is going to take home a set of 3 lb weights to increase.  She continues to improve her strength and stamina.  She stopped doing some of her PT exercises but was encouraged to get back to them again. She wants to be independent in self care so she keeps working. Ruth White is doing well in the program. She recently improved her overall average MET level back up above 2 METs. She has also done well with her seated machines as she improved to level 2 on the T5, level 3 on the biostep, and level 2 on the recumbent bike. She was able to walk up to 15 laps on the track as well. We will continue to monitor her progress in the program. Ruth White has not attended since last review as her husband had surgery done and is now not able to get transportation to rehab. We will follow up with patient to see when she is able to return and hope to see good attendance thereafter. Ruth White has not attended since last review as her husband had surgery done and she is now not able to get transportation to rehab. We will follow up with patient to see when she is able to return and hope to see good attendance thereafter. Ruth White ia waiting to hear from her husband's doctor on whether or not he is cleared to drive so she is able to have stable transportation again. Their follow up appt is this week.   Expected Outcomes Short: Get back to PT exercises for shoulder Long: Continue to improve stamina Short: Continue to push for more laps on the track. Long: Continue to improve strength and stamina. Short: Return to rehab when transportation allows Long: Graduate from the [facility] Short: Return to rehab when transportation allows Long: Graduate from the [facility] Short: Return to rehab when transportation allows Long: Graduate from the [employer] Name 08/02/22 [phone] 08/13/22 1539 08/16/22 0819 08/28/22 1436 09/11/22 0814     Exercise Goal Re-Evaluation  Exercise Goals Review -- Increase Physical Activity;Increase Strength and Stamina;Understanding of Exercise Prescription Increase Physical Activity;Increase Strength and Stamina;Understanding of Exercise Prescription Increase Physical Activity;Increase Strength and Stamina;Understanding of Exercise Prescription Increase Physical Activity;Increase Strength and Stamina;Understanding of Exercise Prescription   Comments Ia ia waiting to hear from her husband's doctor on whether or not he is cleared to drive so she is able to have stable transportation again. She reports that she has been doing some exercise at home. We will continue to follow up with her on when she is able to return to the program. Eve returned back to rehab after being out due to her husband's surgery. She eased back into her exercise regimen and will gradually increase it over time. She was able to still walk 14 laps on the track and exercise at level 3 on the T4 Nustep. We will continue to monitor as she slowly gets back into her routine again. Kamyiah returned to rehab last week.  She has been using her bike at home while she was out.  She has been doing 20 min on, 20 min active rest (weights or house work), and 20 min again on bike.  She has returned without any loss to stamina.  She is interested in trying treadmill next week. Spring is doing well in rehab. She recently increased her overall average MET level to 2.55 METs. She also improved to level 2 on the T5 nustep and walked up to 25 laps on the track. She increased from 3 lb to 4 lb hand weights for resistance training as well. We will continue to monitor her progress in the program. Jilleen is doing well in rehab.  She is still using her bike some at home.  She has been running to appointments during day.  But she squeezed in some time on her bike.  She is aiming to get in 20 min on her bike at least one other day of the week. Her stamina continues to improved overall.  She still has days  that she feels like she has no energy.  She is determined to finish up and continue to exercise and improve.   Expected Outcomes Short: Return to rehab when transportation allows. Long: Graduate from the [employer]. Short: Ease back into exercise prescription slowly Long: Continue to increase overall MET level and stamina Short: Try out treadmill Long: conitnue to exercise independently Short: Try level 2 on the biostep. Long: Continue to improve strength and stamina. --    Row Name 09/11/22 1438 09/27/22 1348           Exercise Goal Re-Evaluation   Exercise Goals Review Increase Physical Activity;Increase Strength and Stamina;Understanding of Exercise Prescription Increase Physical Activity;Increase Strength and Stamina;Understanding of Exercise Prescription      Comments Ruth White continues to do well in rehab. She had been out for a little bit which put her a little behind on exercise. She is slowly working her way up. She was able to exercise at level 2 working up to 19 watts. We hope to see her add in walking to her regimen again. Her oxygen saturations stay above 88%. She could benefit from increasing her seated machines, both her BS and T4 to level 2. We will continue to monitor. Ruth White is doing well in rehab. She recently improved her overall average MET level to 2.61 METs. She also improved to level 3 on the biostep and T4 nustep. She did see a decrease in her laps walked on the track down  to 15 laps. We will continue to monitor her progress in the program.      Expected Outcomes Short: Increased seated machines to level 2 and add walking back in Long: Continue to increase overall MET level and strength Short: Increase walking laps back up to 25 laps. Long: Continue to improve strength and stamina.               Discharge Exercise Prescription (Final Exercise Prescription Changes):  Exercise Prescription Changes - 09/27/22 1300       Response to Exercise   Blood Pressure (Admit)  122/72    Blood Pressure (Exit) 102/64    Heart Rate (Admit) 96 bpm    Heart Rate (Exercise) 113 bpm    Heart Rate (Exit) 90 bpm    Oxygen Saturation (Admit) 98 %    Oxygen Saturation (Exercise) 91 %    Oxygen Saturation (Exit) 99 %    Rating of Perceived Exertion (Exercise) 15    Perceived Dyspnea (Exercise) 3    Symptoms SOB    Duration Continue with 30 min of aerobic exercise without signs/symptoms of physical distress.    Intensity THRR unchanged      Progression   Progression Continue to progress workloads to maintain intensity without signs/symptoms of physical distress.    Average METs 2.61      Resistance Training   Training Prescription Yes    Weight 2 lb    Reps 10-15      Interval Training   Interval Training No      Oxygen   Oxygen Continuous    Liters 2      Recumbant Bike   Level 2    Watts 24    Minutes 15    METs 3.02      NuStep   Level 3    Minutes 15    METs 2.9      T5 Nustep   Level 2    Minutes 15    METs 1.9      Biostep-RELP   Level 3    Minutes 15    METs 3      Track   Laps 15    Minutes 15    METs 1.82      Home Exercise Plan   Plans to continue exercise at Home (comment)   walking, recumbent bike, bands, weights   Frequency Add 2 additional days to program exercise sessions.    Initial Home Exercises Provided 04/05/22      Oxygen   Maintain Oxygen Saturation 88% or higher             Nutrition:  Target Goals: Understanding of nutrition guidelines, daily intake of sodium 1500mg , cholesterol 200mg , calories 30% from fat and 7% or less from saturated fats, daily to have 5 or more servings of fruits and vegetables.  Education: All About Nutrition: -Group instruction provided by verbal, written material, interactive activities, discussions, models, and posters to present general guidelines for heart healthy nutrition including fat, fiber, MyPlate, the role of sodium in heart healthy nutrition, utilization of the  nutrition label, and utilization of this knowledge for meal planning. Follow up email sent as well. Written material given at graduation. Flowsheet Row Pulmonary Rehab from 09/20/2022 in [facility] Cardiac and Pulmonary Rehab  Date 05/03/22  [05/17/22 Part II]  Educator [facility]  Instruction Review Code 1- Verbalizes Understanding       Biometrics:   Post Biometrics - 09/28/22 [phone]  Post  Biometrics   Height 5' 3.5" (1.613 m)    Weight 160 lb 4.8 oz (72.7 kg)    Waist Circumference 34 inches    Hip Circumference 37.5 inches    Waist to Hip Ratio 0.91 %    BMI (Calculated) 27.95    Single Leg Stand 5.6 seconds             Nutrition Therapy Plan and Nutrition Goals:   Nutrition Assessments:  MEDIFICTS Score Key: ?70 Need to make dietary changes  40-70 Heart Healthy Diet ? 40 Therapeutic Level Cholesterol Diet  Flowsheet Row Pulmonary Rehab from 02/26/2022 in [facility] Cardiac and Pulmonary Rehab  Picture Your Plate Total Score on Admission 75      Picture Your Plate Scores: <16 Unhealthy dietary pattern with much room for improvement. 41-50 Dietary pattern unlikely to meet recommendations for good health and room for improvement. 51-60 More healthful dietary pattern, with some room for improvement.  >60 Healthy dietary pattern, although there may be some specific behaviors that could be improved.   Nutrition Goals Re-Evaluation:  Nutrition Goals Re-Evaluation     Row Name 05/01/22 0752 05/31/22 0758 08/16/22 0827 09/11/22 1096       Goals   Nutrition Goal ST: consider adding in a nutritional supplement, practice mechanical eating, eat protein foods first, aim to include fiber, fat, and protein at most meals. LT: Meet energy/protein needs SHort; Continue mechanical eating until full Long: Continue to get in more protein SHort; Continue mechanical eating until full Long: Continue to get in more protein Short: Try meal replacment shakes Long; Conitnue to work on mechanical  eating    Comment Ruth White is practicing mechanical eating as she knows she needs to eat, but still not feeling hungry.  She is still working on getting in more protein.  Her husband is trying to stay on top of her about it and makes sure she is eating daily. Ruth White is doing well. She is now eating when she is hungry.  She has not been doing her mechanincal eating.  She says that her stomach has been hurting which can be related to her increased stress. We talked about the importance of eating to fuel her system.  She was encouraged to try meal replacement shakes or protein boosters.  She said she tried before and it hurt her stomach. Ruth White is still not eating a lot and her husband will ask her if she ate.  One meal fills her up but she is unable to eat without a bowel movement.  She is also walking to help with bowel movements.  She was encouraged to continue to use mechanical eating.  She still has not tried meal replacement shakes.  Her husband bought her some, but she has not tried them yet.  She was encouraged to try blending it with ice to make is more palatable to her. Ruth White is doing well in rehab.  She is trying to drink the glucerna.  She says that it feels thick in her mouth. she tried it in blender as well.  She is trying to get in mechanical eating and getting something in.  Yesterday, they made black beans to eat.  She is trying to add in more protein.    Expected Outcome SHort; Continue mechanical eating until full Long: Continue to get in more protein Short: Find a source of protein she can drink. Long; Continue to work on eating more Short: Try meal replacment shakes Long; Conitnue to work on  mechanical eating Short: Keep using replacement shakes Long: Continue to eat routinely             Nutrition Goals Discharge (Final Nutrition Goals Re-Evaluation):  Nutrition Goals Re-Evaluation - 09/11/22 0821       Goals   Nutrition Goal Short: Try meal replacment shakes Long; Conitnue to work on  mechanical eating    Comment Ruth White is doing well in rehab.  She is trying to drink the glucerna.  She says that it feels thick in her mouth. she tried it in blender as well.  She is trying to get in mechanical eating and getting something in.  Yesterday, they made black beans to eat.  She is trying to add in more protein.    Expected Outcome Short: Keep using replacement shakes Long: Continue to eat routinely             Psychosocial: Target Goals: Acknowledge presence or absence of significant depression and/or stress, maximize coping skills, provide positive support system. Participant is able to verbalize types and ability to use techniques and skills needed for reducing stress and depression.   Education: Stress, Anxiety, and Depression - Group verbal and visual presentation to define topics covered.  Reviews how body is impacted by stress, anxiety, and depression.  Also discusses healthy ways to reduce stress and to treat/manage anxiety and depression.  Written material given at graduation. Flowsheet Row Pulmonary Rehab from 09/20/2022 in [facility] Cardiac and Pulmonary Rehab  Date 04/11/22  Educator [facility]  Instruction Review Code 1- [employer] Understanding       Education: Sleep Hygiene -Provides group verbal and written instruction about how sleep can affect your health.  Define sleep hygiene, discuss sleep cycles and impact of sleep habits. Review good sleep hygiene tips.    Initial Review & Psychosocial Screening:   Quality of Life Scores:  Scores of 19 and below usually indicate a poorer quality of life in these areas.  A difference of  2-3 points is a clinically meaningful difference.  A difference of 2-3 points in the total score of the Quality of Life Index has been associated with significant improvement in overall quality of life, self-image, physical symptoms, and general health in studies assessing change in quality of life.  PHQ-9: Review Flowsheet  More data may exist       09/11/2022 08/16/2022 05/31/2022 05/01/2022 02/26/2022  Depression screen PHQ 2/9  Decreased Interest Down, Depressed, Hopeless PHQ - 2 Score Altered sleeping 0 1 0 0 3  Tired, decreased energy Change in appetite Feeling bad or failure about yourself  0 0 Trouble concentrating Moving slowly or fidgety/restless Suicidal thoughts 0 0 0 0 0  PHQ-9 Score Difficult doing work/chores Somewhat difficult Somewhat difficult Somewhat difficult Somewhat difficult Somewhat difficult   Interpretation of Total Score  Total Score Depression Severity:  1-4 = Minimal depression, 5-9 = Mild depression, 10-14 = Moderate depression, 15-19 = Moderately severe depression, 20-27 = Severe depression   Psychosocial Evaluation and Intervention:   Psychosocial Re-Evaluation:  Psychosocial Re-Evaluation     Row Name 05/01/22 0739 05/31/22 0755 08/16/22 0825 09/11/22 0816       Psychosocial Re-Evaluation   Current issues with Current  Depression;Current Anxiety/Panic;Current Psychotropic Meds;Current Sleep Concerns;Current Stress Concerns Current Depression;Current Anxiety/Panic;Current Psychotropic Meds;Current Sleep Concerns;Current Stress Concerns Current Depression;Current Anxiety/Panic;Current Psychotropic Meds;Current Sleep Concerns;Current Stress Concerns Current Depression;Current Anxiety/Panic;Current Psychotropic Meds;Current Sleep Concerns;Current Stress Concerns    Comments Sherece has been doing well up until a fall last week.  Her PHQ has greatly improved from 17 down to 9.  She is feeling better up until her fall.  She is sleeping better and trying to find the good.  She just get frustrated that she is unable to do everything she wants to.  She wants to continue to build up her strength and stamina.  She is feeling like her mood is balanced Ruth White is doing well in rehab.  She saw her doctor yesterday  and they increased her depression meds to help her cope better.  Her husband has just been diagnosised with cancer and they are now undergoing lots of appts and blood work to [employer] treatment.  He will have surgery in January and they hope to remove it.  She is sleeping good despite getting up in the night at eat.  Her PHQ has increased slightly, but she just had a med increase and has a lot of stress with her husband recently Ruth White has been out with her husband's surgery. She has been exercising at home and both are happy to have her back.  Her PHQ did worsen by a point, but she has been having issues with sleep and her medications.  They have been causing hallucincations.  She talked to her doctor about it, and they have reduced the dosage and it seems to be helping now.  She is happy to be back in rehab and wants to try out treadmill to push herself. Ruth White is doing well in rehab.  Today is not a good mental health day.  Her husband says that she has been hateful recently.  She continues to cope with her illness and her husband's cancer. Her PHQ has improved some more.  She said she let onions burn yesterday and no awareness of what was going on.  She was concerned about this.  She did talk to doctor about sleeping and has a follow up with her tomorrow to get a brace to help leg balance and stay supported.    Expected Outcomes Short: Get back to routine exercise again Long: Continue to exercise to build stamina. Short; Conitnue to cope with husbands diagnosis Long: Conitnue to exercise for mental boost Short: Continue to work with doctor abdout sleep Long: Conitnue to exericse for mental  boost Short: Talk to doctor about leg Long Continue to exercise for mental boost    Interventions Stress management education;Encouraged to attend Pulmonary Rehabilitation for the exercise Stress management education;Encouraged to attend Pulmonary Rehabilitation for the exercise Stress management education;Encouraged to  attend Pulmonary Rehabilitation for the exercise Stress management education;Encouraged to attend Pulmonary Rehabilitation for the exercise    Continue Psychosocial Services  Follow up required by staff Follow up required by staff Follow up required by staff --             Psychosocial Discharge (Final Psychosocial Re-Evaluation):  Psychosocial Re-Evaluation - 09/11/22 0816       Psychosocial Re-Evaluation   Current issues with Current Depression;Current Anxiety/Panic;Current Psychotropic Meds;Current Sleep Concerns;Current Stress Concerns    Comments Ruth White is doing well in rehab.  Today is not a good mental health day.  Her husband says that she has been hateful recently.  She continues to cope  with her illness and her husband's cancer. Her PHQ has improved some more.  She said she let onions burn yesterday and no awareness of what was going on.  She was concerned about this.  She did talk to doctor about sleeping and has a follow up with her tomorrow to get a brace to help leg balance and stay supported.    Expected Outcomes Short: Talk to doctor about leg Long Continue to exercise for mental boost    Interventions Stress management education;Encouraged to attend Pulmonary Rehabilitation for the exercise             Education: Education Goals: Education classes will be provided on a weekly basis, covering required topics. Participant will state understanding/return demonstration of topics presented.  Learning Barriers/Preferences:   General Pulmonary Education Topics:  Infection Prevention: - Provides verbal and written material to individual with discussion of infection control including proper hand washing and proper equipment cleaning during exercise session. Flowsheet Row Pulmonary Rehab from 09/20/2022 in [facility] Cardiac and Pulmonary Rehab  Date 02/26/22  Educator [facility]  Instruction Review Code 1- Verbalizes Understanding       Falls Prevention: - Provides verbal and  written material to individual with discussion of falls prevention and safety. Flowsheet Row Pulmonary Rehab from 09/20/2022 in [facility] Cardiac and Pulmonary Rehab  Date 02/26/22  Educator [facility]  Instruction Review Code 1- Verbalizes Understanding       Chronic Lung Disease Review: - Group verbal instruction with posters, models, PowerPoint presentations and videos,  to review new updates, new respiratory medications, new advancements in procedures and treatments. Providing information on websites and "800" numbers for continued self-education. Includes information about supplement oxygen, available portable oxygen systems, continuous and intermittent flow rates, oxygen safety, concentrators, and Medicare reimbursement for oxygen. Explanation of Pulmonary Drugs, including class, frequency, complications, importance of spacers, rinsing mouth after steroid MDI's, and proper cleaning methods for nebulizers. Review of basic lung anatomy and physiology related to function, structure, and complications of lung disease. Review of risk factors. Discussion about methods for diagnosing sleep apnea and types of masks and machines for OSA. Includes a review of the use of types of environmental controls: home humidity, furnaces, filters, dust mite/pet prevention, HEPA vacuums. Discussion about weather changes, air quality and the benefits of nasal washing. Instruction on Warning signs, infection symptoms, calling MD promptly, preventive modes, and value of vaccinations. Review of effective airway clearance, coughing and/or vibration techniques. Emphasizing that all should Create an Action Plan. Written material given at graduation. Flowsheet Row Pulmonary Rehab from 09/20/2022 in [facility] Cardiac and Pulmonary Rehab  Education need identified 02/26/22  Date 04/05/22  Educator [facility]  Instruction Review Code 1- Verbalizes Understanding       AED/CPR: - Group verbal and written instruction with the use of models to demonstrate  the basic use of the AED with the basic ABC's of resuscitation.    Anatomy and Cardiac Procedures: - Group verbal and visual presentation and models provide information about basic cardiac anatomy and function. Reviews the testing methods done to diagnose heart disease and the outcomes of the test results. Describes the treatment choices: Medical Management, Angioplasty, or Coronary Bypass Surgery for treating various heart conditions including Myocardial Infarction, Angina, Valve Disease, and Cardiac Arrhythmias.  Written material given at graduation. Flowsheet Row Pulmonary Rehab from 09/20/2022 in [facility] Cardiac and Pulmonary Rehab  Date 05/24/22  Educator [facility]  Instruction Review Code 1- Verbalizes Understanding       Medication Safety: - Group  verbal and visual instruction to review commonly prescribed medications for heart and lung disease. Reviews the medication, class of the drug, and side effects. Includes the steps to properly store meds and maintain the prescription regimen.  Written material given at graduation. Flowsheet Row Pulmonary Rehab from 09/20/2022 in [facility] Cardiac and Pulmonary Rehab  Date 08/09/22  Educator [facility]  Instruction Review Code 1- Verbalizes Understanding       Other: -Provides group and verbal instruction on various topics (see comments)   Knowledge Questionnaire Score:    Core Components/Risk Factors/Patient Goals at Admission:   Education:Diabetes - Individual verbal and written instruction to review signs/symptoms of diabetes, desired ranges of glucose level fasting, after meals and with exercise. Acknowledge that pre and post exercise glucose checks will be done for 3 sessions at entry of program. Flowsheet Row Pulmonary Rehab from 09/20/2022 in [facility] Cardiac and Pulmonary Rehab  Date 02/26/22  Educator [facility]  Instruction Review Code 1- Verbalizes Understanding       Know Your Numbers and Heart Failure: - Group verbal and visual instruction to discuss  disease risk factors for cardiac and pulmonary disease and treatment options.  Reviews associated critical values for Overweight/Obesity, Hypertension, Cholesterol, and Diabetes.  Discusses basics of heart failure: signs/symptoms and treatments.  Introduces Heart Failure Zone chart for action plan for heart failure.  Written material given at graduation. Flowsheet Row Pulmonary Rehab from 09/20/2022 in [facility] Cardiac and Pulmonary Rehab  Date 08/16/22  Educator [facility]  Instruction Review Code 1- Verbalizes Understanding       Core Components/Risk Factors/Patient Goals Review:   Goals and Risk Factor Review     Row Name 05/01/22 0754 05/31/22 0803 08/16/22 0830 09/11/22 0823       Core Components/Risk Factors/Patient Goals Review   Personal Goals Review Weight Management/Obesity;Improve shortness of breath with ADL's;Increase knowledge of respiratory medications and ability to use respiratory devices properly.;Diabetes;Hypertension;Lipids Weight Management/Obesity;Improve shortness of breath with ADL's;Increase knowledge of respiratory medications and ability to use respiratory devices properly.;Diabetes;Hypertension;Lipids Weight Management/Obesity;Improve shortness of breath with ADL's;Increase knowledge of respiratory medications and ability to use respiratory devices properly.;Diabetes;Hypertension;Lipids Weight Management/Obesity;Improve shortness of breath with ADL's;Increase knowledge of respiratory medications and ability to use respiratory devices properly.;Diabetes;Hypertension;Lipids    Review Ruth White is doing well in rehab. She missed some time with medical appointments and a fall last week.  She is doing better with her breahing and practices it at home.  She is keeping her weight steady and watching her pressures and sugars at home with her husbands help.  She is good about using her nebulizer and inhalers. Ruth White is doing well in rehab.  Ruth White is losing weight.  We talked about adding in protein  to her diet.  She is finding that she is doing better with her breathing for most part.  She still has some bad breathing days, but it is getting better.  She continues to build her stamina to be able to do to do more around the house.  Her blood sugars are stable feeling and her Alc was 6.6!!  She was unable to get a new meter on her insurance so her doctor ordered her another meter.   She continues to work to get it down.  Her pressures have been on the lower side but she has not had problems with it. Mychele returned to rehab after being out with her husband.  Her weight was up 4 lb while she was out.  She hopes to maintain her weight, but  she is not eating like she should and we talked about meal replacement shakes.  Her sugars have been doing well at home.  Her pressures have been doing well too. Her breathing has good days and bad days.  She has been wearing her oxygen more at home.  She continues to use her PLB.  She is doing well on her inhaler and nebulizer. Ragna is doing well in rehab.  Her weight has been going up some again. She was 165 lb today.  She continues to try to maintain her weight and make sure she goes to bathrrom each day.  We talked about making sure she is getting in enough fluids and fiber to help keep her regular.  She is eating pears to help.  Her sugars and pressures continue to do well at home.  Her breathing is doing well for most part, but still has a few bad days.  She will stop and rest when she gets too SOB and use her PLB.  She continues to do well on her meds as well.    Expected Outcomes Short: Conitnue to work on breathing Long: Conitnue to monitor weight to not lose SHort Get new meter Long: Continue to monitor risk factors Short: Try meal replacement shakes Long: Continue to monitor risk factors Short: Keep eating and watching weight Long: Continue to montior risk factors             Core Components/Risk Factors/Patient Goals at Discharge (Final Review):   Goals and  Risk Factor Review - 09/11/22 0823       Core Components/Risk Factors/Patient Goals Review   Personal Goals Review Weight Management/Obesity;Improve shortness of breath with ADL's;Increase knowledge of respiratory medications and ability to use respiratory devices properly.;Diabetes;Hypertension;Lipids    Review Selene is doing well in rehab.  Her weight has been going up some again. She was 165 lb today.  She continues to try to maintain her weight and make sure she goes to bathrrom each day.  We talked about making sure she is getting in enough fluids and fiber to help keep her regular.  She is eating pears to help.  Her sugars and pressures continue to do well at home.  Her breathing is doing well for most part, but still has a few bad days.  She will stop and rest when she gets too SOB and use her PLB.  She continues to do well on her meds as well.    Expected Outcomes Short: Keep eating and watching weight Long: Continue to montior risk factors             ITP Comments:  ITP Comments     Row Name 04/18/22 0935 05/01/22 0736 05/16/22 0808 06/04/22 1242 06/13/22 1021   ITP Comments 30 Day review completed. Medical Director ITP review done, changes made as directed, and signed approval by Medical Director.    Raymond has continued PR and is showing improvement with independence of movement Bennetta had a fall off the toliet last week which kept her out for an extra day. 30 Day review completed. Medical Director ITP review done, changes made as directed, and signed approval by Medical Director. Averleigh will be out for several weeks as her husband is getting surgery and she will not have transportation to rehab- she also wants to be there for for him. We will place patient on hold and contact her after the New Year to see when she will return. 30 Day review completed. Medical Director  ITP review done, changes made as directed, and signed approval by Medical Director.    Row Name 06/19/22 1133 07/03/22  1423 08/07/22 0754 08/08/22 1259 09/05/22 0824   ITP Comments Douglas has been out as she has not had transportation due to her husband's surgery.  He is still not able to drive and currently on narcotics.  He has a follow up next week but still not able to drive and then sit.  Thus, she will not be able to attend due to transportation issues.  We will leave her out on hold until he is able to drive again. She is riding her bike and using her weights at home in the mean time. Called patient to check in.  She is doing her exercise at home, but her husband still cannot drive.  His next follow up in on 07/20/22.  We have extended her medical hold until then.  She will keep [place] posted on his ability to drive. Alannys returned today.  She was able to return to her normal exercise routine in rehab. We will follow up with her again once she has regular attendance. 30 day review completed. ITP sent to Dr. Jinny Sanders, Medical Director of  Pulmonary Rehab. Continue with ITP unless changes are made by physician. 30 Day review completed. Medical Director ITP review done, changes made as directed, and signed approval by Medical Director.    Row Name 10/03/22 0920           ITP Comments 30 day review completed. ITP sent to Dr. Jinny Sanders, Medical Director of  Pulmonary Rehab. Continue with ITP unless changes are made by physician.                Comments: 30 day review

## 2022-10-04 ENCOUNTER — Encounter: Payer: PPO | Admitting: *Deleted

## 2022-10-04 DIAGNOSIS — J449 Chronic obstructive pulmonary disease, unspecified: Secondary | ICD-10-CM

## 2022-10-04 NOTE — Progress Notes (Signed)
Daily Session Note  Patient Details  Name: Ruth White MRN: [id number] Date of Birth: Jul 17, 1960 Referring Provider:   Flowsheet Row Pulmonary Rehab from 02/26/2022 in [facility] Cardiac and Pulmonary Rehab  Referring Provider Luciano Cutter MD       Encounter Date: 10/04/2022  Check In:  Session Check In - 10/04/22 0747       Check-In   Supervising physician immediately available to respond to emergencies See telemetry face sheet for immediately available ER MD    Location [facility]-Cardiac & Pulmonary Rehab    Staff Present Lanny Hurst, RN, ADN;Jessica Juanetta Gosling, MA, RCEP, CCRP, CCET;Joseph [place], [place]    Virtual Visit No    Medication changes reported     No    Fall or balance concerns reported    No    Warm-up and Cool-down Performed on first and last piece of equipment    Resistance Training Performed Yes    VAD Patient? No    PAD/SET Patient? No      Pain Assessment   Currently in Pain? No/denies                Social History   Tobacco Use  Smoking Status Never  Smokeless Tobacco Never    Goals Met:  Independence with exercise equipment Exercise tolerated well No report of concerns or symptoms today Strength training completed today  Goals Unmet:  Not Applicable  Comments: Pt able to follow exercise prescription today without complaint.  Will continue to monitor for progression.    Dr. Bethann Punches is Medical Director for [facility] Cardiac Rehabilitation.  Dr. Vida Rigger is Medical Director for [facility] Pulmonary Rehabilitation.

## 2022-10-09 ENCOUNTER — Encounter: Payer: PPO | Admitting: *Deleted

## 2022-10-09 DIAGNOSIS — J449 Chronic obstructive pulmonary disease, unspecified: Secondary | ICD-10-CM

## 2022-10-09 NOTE — Progress Notes (Signed)
Daily Session Note  Patient Details  Name: Renesmay Nesbitt MRN: [id number] Date of Birth: [birth year] Referring Provider:   Flowsheet Row Pulmonary Rehab from 02/26/2022 in [facility] Cardiac and Pulmonary Rehab  Referring Provider Luciano Cutter MD       Encounter Date: 10/09/2022  Check In:  Session Check In - 10/09/22 0752       Check-In   Supervising physician immediately available to respond to emergencies See telemetry face sheet for immediately available ER MD    Location [facility]-Cardiac & Pulmonary Rehab    Staff Present Lanny Hurst, RN, ADN;Jessica Juanetta Gosling, MA, RCEP, CCRP, Zackery Barefoot, MS, ACSM CEP, Exercise Physiologist    Virtual Visit No    Medication changes reported     No    Fall or balance concerns reported    No    Warm-up and Cool-down Performed on first and last piece of equipment    Resistance Training Performed Yes    VAD Patient? No    PAD/SET Patient? No      Pain Assessment   Currently in Pain? No/denies                Social History   Tobacco Use  Smoking Status Never  Smokeless Tobacco Never    Goals Met:  Independence with exercise equipment Exercise tolerated well No report of concerns or symptoms today Strength training completed today  Goals Unmet:  Not Applicable  Comments: Pt able to follow exercise prescription today without complaint.  Will continue to monitor for progression.    Dr. Bethann Punches is Medical Director for [facility] Cardiac Rehabilitation.  Dr. Vida Rigger is Medical Director for [facility] Pulmonary Rehabilitation.

## 2022-10-11 ENCOUNTER — Encounter: Payer: PPO | Admitting: *Deleted

## 2022-10-11 DIAGNOSIS — J449 Chronic obstructive pulmonary disease, unspecified: Secondary | ICD-10-CM

## 2022-10-11 NOTE — Progress Notes (Signed)
Daily Session Note  Patient Details  Name: Ruth White MRN: [id number] Date of Birth: Jan 20, 1961 Referring Provider:   Flowsheet Row Pulmonary Rehab from 02/26/2022 in [facility] Cardiac and Pulmonary Rehab  Referring Provider Ruth Cutter MD       Encounter Date: 10/11/2022  Check In:  Session Check In - 10/11/22 0746       Check-In   Supervising physician immediately available to respond to emergencies See telemetry face sheet for immediately available ER MD    Location [facility]-Cardiac & Pulmonary Rehab    Staff Present Ruth Hurst, RN, ADN;Ruth Juanetta Gosling, MA, RCEP, CCRP, CCET;Ruth Coder, PhD, RN, CNS, CEN    Virtual Visit No    Medication changes reported     No    Fall or balance concerns reported    No    Warm-up and Cool-down Performed on first and last piece of equipment    Resistance Training Performed Yes    VAD Patient? No    PAD/SET Patient? No      Pain Assessment   Currently in Pain? No/denies                Social History   Tobacco Use  Smoking Status Never  Smokeless Tobacco Never    Goals Met:  Independence with exercise equipment Exercise tolerated well No report of concerns or symptoms today Strength training completed today  Goals Unmet:  Not Applicable  Comments:  Ruth White graduated today from  rehab with 35 sessions completed.  Details of the patient's exercise prescription and what She needs to do in order to continue the prescription and progress were discussed with patient.  Patient was given a copy of prescription and goals.  Patient verbalized understanding.  Ruth White plans to continue to exercise by walking, recumbent bike, bands, and weights.     Dr. Bethann White is Medical Director for [facility] Cardiac Rehabilitation.  Dr. Vida White is Medical Director for [facility] Pulmonary Rehabilitation.

## 2022-10-11 NOTE — Progress Notes (Signed)
Pulmonary Individual Treatment Plan  Patient Details  Name: Evanthia Maund MRN: [id number] Date of Birth: [birth year] Referring Provider:   Flowsheet Row Pulmonary Rehab from 02/26/2022 in [facility] Cardiac and Pulmonary Rehab  Referring Provider Luciano Cutter MD       Initial Encounter Date:  Flowsheet Row Pulmonary Rehab from 02/26/2022 in [facility] Cardiac and Pulmonary Rehab  Date 02/26/22       Visit Diagnosis: Stage 3 severe COPD by GOLD classification  Patient's Home Medications on Admission:  Current Outpatient Medications:    ACCU-CHEK GUIDE test strip, 1 (ONE) EACH DAILY AND AS NEEDED FOR SYMPTOMS, Disp: , Rfl:    Accu-Chek Softclix Lancets lancets, USE 1 LANCET DAILY AS DIRECTED, Disp: , Rfl:    anastrozole (ARIMIDEX) 1 MG tablet, TAKE 1 TABLET BY MOUTH EVERY DAY TO PREVENT FUTURE DISEASE RECURRENCE, Disp: 90 tablet, Rfl: 3   aspirin 325 MG EC tablet, Take 325 mg by mouth daily., Disp: , Rfl:    atenolol (TENORMIN) 50 MG tablet, Take 50 mg by mouth daily., Disp: , Rfl:    BD PEN NEEDLE NANO 2ND GEN 32G X 4 MM MISC, 1 (ONE) PEN NEEDLE USE DAILY, Disp: , Rfl:    Blood Glucose Monitoring Suppl (ACCU-CHEK GUIDE ME) w/Device KIT, 1 (ONE) KIT CHECK BLOOD GLUCOSE DAILY, Disp: , Rfl:    BREZTRI AEROSPHERE 160-9-4.8 MCG/ACT AERO, Inhale 2 puffs into the lungs in the morning and at bedtime., Disp: 10.7 g, Rfl: 5   cyclobenzaprine (FLEXERIL) 10 MG tablet, Take 1 tablet by mouth 2 (two) times daily as needed., Disp: , Rfl:    diclofenac Sodium (VOLTAREN) 1 % GEL, Apply topically., Disp: , Rfl:    Evolocumab (REPATHA SURECLICK) 140 MG/ML SOAJ, Inject 1 mL into the skin every 14 (fourteen) days., Disp: 2 mL, Rfl: 11   ezetimibe (ZETIA) 10 MG tablet, Take 10 mg by mouth at bedtime., Disp: , Rfl:    FARXIGA 10 MG TABS tablet, Take 10 mg by mouth every morning., Disp: , Rfl:    glucose blood (ACCU-CHEK GUIDE) test strip, 1 (ONE) EACH DAILY AND AS NEEDED FOR SYMPTOMS, Disp: , Rfl:     ibuprofen (ADVIL) 800 MG tablet, Take 800 mg by mouth every 8 (eight) hours as needed., Disp: , Rfl:    lactulose, encephalopathy, (CHRONULAC) 10 GM/15ML SOLN, Take 15 mLs (10 g total) by mouth daily., Disp: 473 mL, Rfl: 2   lisinopril (ZESTRIL) 20 MG tablet, Take 20 mg by mouth 2 (two) times daily., Disp: , Rfl:    magic mouthwash SOLN, Take 5 mLs by mouth., Disp: , Rfl:    metFORMIN (GLUCOPHAGE-XR) 500 MG 24 hr tablet, Take 500 mg by mouth daily., Disp: , Rfl:    naloxone (NARCAN) nasal spray 4 mg/0.1 mL, Use as directed for overdose of narcotic medication, Disp: 1 each, Rfl: 2   NUCYNTA 50 MG tablet, Take 50 mg by mouth 4 (four) times daily., Disp: , Rfl:    ondansetron (ZOFRAN) 4 MG tablet, Take 4 mg by mouth every 8 (eight) hours as needed for nausea or vomiting., Disp: , Rfl:    Oxycodone HCl 10 MG TABS, Take 0.5 tablets (5 mg total) by mouth every 4 (four) hours as needed., Disp: 120 tablet, Rfl: 0   pregabalin (LYRICA) 75 MG capsule, TAKE 1 CAPSULE BY MOUTH TWICE A DAY, Disp: 180 capsule, Rfl: 0   prochlorperazine (COMPAZINE) 10 MG tablet, Take 1 tablet (10 mg total) by mouth every 6 (six) hours  as needed for nausea or vomiting., Disp: 30 tablet, Rfl: 1   rosuvastatin (CRESTOR) 40 MG tablet, Take 40 mg by mouth daily., Disp: , Rfl:    senna (SENOKOT) 8.6 MG tablet, Take 1 tablet by mouth daily. 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, left (HCC)    Personal history of chemotherapy    Personal history of radiation therapy     Tobacco Use: Social History   Tobacco Use  Smoking Status Never  Smokeless Tobacco Never    Labs: Review Flowsheet        No data to display           Pulmonary Assessment Scores:  Pulmonary Assessment Scores     Row Name 10/04/22 0811         ADL UCSD   ADL Phase Exit     SOB Score total 67     Rest 3     Walk 3     Stairs 4     Bath 4     Dress 4     Shop --  n/a       CAT Score   CAT Score 29              UCSD: Self-administered rating of dyspnea associated with activities of daily living (ADLs) 6-point scale (0 = "not at all" to 5 = "maximal or unable to do because of breathlessness")  Scoring Scores range from 0 to 120.  Minimally important difference is 5 units  CAT: CAT can identify the health impairment of COPD patients and is better correlated with disease progression.  CAT has a scoring range of zero to 40. The CAT score is classified into four groups of low (less than 10), medium (10 - 20), high (21-30) and very high (31-40) based on the impact level of disease on health status. A CAT score over 10 suggests significant symptoms.  A worsening CAT score could be explained by an exacerbation, poor medication adherence, poor inhaler technique, or progression of COPD or comorbid conditions.  CAT MCID is 2 points  mMRC: mMRC (Modified Medical Research Council) Dyspnea Scale is used to assess the degree of baseline functional disability in patients of respiratory disease due to dyspnea. No minimal important difference is established. A decrease in score of 1 point or greater is considered a positive change.   Pulmonary Function Assessment:   Exercise Target Goals: Exercise Program Goal: Individual exercise prescription set using results from initial 6 min walk test and THRR while considering  patient's activity barriers and safety.   Exercise  Prescription Goal: Initial exercise prescription builds to 30-45 minutes a day of aerobic activity, 2-3 days per week.  Home exercise guidelines will be given to patient during program as part of exercise prescription that the participant will acknowledge.  Education:  Aerobic Exercise: - Group verbal and visual presentation on the components of exercise prescription. Introduces F.I.T.T principle from ACSM for exercise prescriptions.  Reviews F.I.T.T. principles of aerobic exercise including progression. Written material given at graduation. Flowsheet Row Pulmonary Rehab from 10/04/2022 in [facility] Cardiac and Pulmonary Rehab  Date 09/13/22  Educator [facility]  Instruction Review Code 1- Verbalizes Understanding       Education: Resistance Exercise: - Group verbal and visual presentation on the components of exercise prescription. Introduces F.I.T.T principle from ACSM for exercise prescriptions  Reviews F.I.T.T. principles of resistance exercise including progression. Written material given at graduation. Flowsheet Row Pulmonary Rehab from 09/13/2022 in [facility] Cardiac and Pulmonary Rehab  Date 03/01/22  Educator NT  Instruction Review Code 1- [employer] Understanding        Education: Exercise & Equipment Safety: - Individual verbal instruction and demonstration of equipment use and safety with use of the equipment. Flowsheet Row Pulmonary Rehab from 10/04/2022 in [facility] Cardiac and Pulmonary Rehab  Date 02/26/22  Educator [facility]  Instruction Review Code 1- Verbalizes Understanding       Education: Exercise Physiology & General Exercise Guidelines: - Group verbal and written instruction with models to review the exercise physiology of the cardiovascular system and associated critical values. Provides general exercise guidelines with specific guidelines to those with heart or lung disease.  Flowsheet Row Pulmonary Rehab from 10/04/2022 in [facility] Cardiac and Pulmonary Rehab  Date 09/06/22  Educator [facility]   Instruction Review Code 1- Verbalizes Understanding       Education: Flexibility, Balance, Mind/Body Relaxation: - Group verbal and visual presentation with interactive activity on the components of exercise prescription. Introduces F.I.T.T principle from ACSM for exercise prescriptions. Reviews F.I.T.T. principles of flexibility and balance exercise training including progression. Also discusses the mind body connection.  Reviews various relaxation techniques to help reduce and manage stress (i.e. Deep breathing, progressive muscle relaxation, and visualization). Balance handout provided to take home. Written material given at graduation. Flowsheet Row Pulmonary Rehab from 09/13/2022 in [facility] Cardiac and Pulmonary Rehab  Date 03/01/22  Educator NT  Instruction Review Code 1- Verbalizes Understanding       Activity Barriers & Risk Stratification:   6 Minute Walk:  6 Minute Walk     Row Name 09/28/22 0819         6 Minute Walk   Phase Discharge     Distance 1000 feet     Distance % Change 100 %     Distance Feet Change 500 ft     Walk Time 6 minutes     # of Rest Breaks 0     MPH 1.89     METS 3.15     RPE 17     Perceived Dyspnea  3     VO2 Peak 11.03     Symptoms Yes (comment)     Comments SOB, Fatigue     Resting HR 99 bpm     Resting BP 126/64     Resting Oxygen Saturation  94 %     Exercise Oxygen Saturation  during 6 min walk 95 %     Max Ex. HR 132 bpm     Max Ex. BP 132/74     2 Minute Post BP 126/60       Interval HR   1 Minute HR 124     2 Minute HR 132     3 Minute HR 127     4 Minute HR 117  5 Minute HR 122     6 Minute HR 128     2 Minute Post HR 107     Interval Heart Rate? Yes       Interval Oxygen   Interval Oxygen? Yes     Baseline Oxygen Saturation % 94 %     1 Minute Oxygen Saturation % 98 %     1 Minute Liters of Oxygen 2 L  CONTINUOUS     2 Minute Oxygen Saturation % 98 %     2 Minute Liters of Oxygen 2 L     3 Minute Oxygen  Saturation % 98 %     3 Minute Liters of Oxygen 2 L     4 Minute Oxygen Saturation % 95 %     4 Minute Liters of Oxygen 2 L     5 Minute Oxygen Saturation % 95 %     5 Minute Liters of Oxygen 2 L     6 Minute Oxygen Saturation % 96 %     6 Minute Liters of Oxygen 2 L     2 Minute Post Oxygen Saturation % 97 %     2 Minute Post Liters of Oxygen 2 L             Oxygen Initial Assessment:   Oxygen Re-Evaluation:  Oxygen Re-Evaluation     Row Name 05/01/22 0756 05/31/22 0809 08/16/22 0833 09/11/22 0828       Program Oxygen Prescription   Program Oxygen Prescription Continuous;E-Tanks Continuous;E-Tanks Continuous;E-Tanks Continuous;E-Tanks    Liters per minute 2 2 2 2       Home Oxygen   Home Oxygen Device E-Tanks;Home Concentrator E-Tanks;Home Concentrator E-Tanks;Home Concentrator E-Tanks;Home Concentrator    Sleep Oxygen Prescription Continuous Continuous Continuous Continuous    Liters per minute 2 2 2 2     Home Exercise Oxygen Prescription Continuous Continuous Continuous Continuous    Liters per minute 2 2 2 2     Home Resting Oxygen Prescription None None None None    Compliance with Home Oxygen Use Yes Yes Yes Yes      Goals/Expected Outcomes   Short Term Goals To learn and demonstrate proper pursed lip breathing techniques or other breathing techniques. ;To learn and exhibit compliance with exercise, home and travel O2 prescription;To learn and understand importance of monitoring SPO2 with pulse oximeter and demonstrate accurate use of the pulse oximeter.;To learn and understand importance of maintaining oxygen saturations>88%;To learn and demonstrate proper use of respiratory medications To learn and demonstrate proper pursed lip breathing techniques or other breathing techniques. ;To learn and exhibit compliance with exercise, home and travel O2 prescription;To learn and understand importance of monitoring SPO2 with pulse oximeter and demonstrate accurate use of the pulse  oximeter.;To learn and understand importance of maintaining oxygen saturations>88%;To learn and demonstrate proper use of respiratory medications To learn and demonstrate proper pursed lip breathing techniques or other breathing techniques. ;To learn and exhibit compliance with exercise, home and travel O2 prescription;To learn and understand importance of monitoring SPO2 with pulse oximeter and demonstrate accurate use of the pulse oximeter.;To learn and understand importance of maintaining oxygen saturations>88%;To learn and demonstrate proper use of respiratory medications To learn and demonstrate proper pursed lip breathing techniques or other breathing techniques. ;To learn and exhibit compliance with exercise, home and travel O2 prescription;To learn and understand importance of monitoring SPO2 with pulse oximeter and demonstrate accurate use of the pulse oximeter.;To learn and understand importance of maintaining oxygen saturations>88%;To  learn and demonstrate proper use of respiratory medications    Long  Term Goals Exhibits proper breathing techniques, such as pursed lip breathing or other method taught during program session;Maintenance of O2 saturations>88%;Exhibits compliance with exercise, home  and travel O2 prescription;Compliance with respiratory medication;Verbalizes importance of monitoring SPO2 with pulse oximeter and return demonstration;Demonstrates proper use of MDI's Exhibits proper breathing techniques, such as pursed lip breathing or other method taught during program session;Maintenance of O2 saturations>88%;Exhibits compliance with exercise, home  and travel O2 prescription;Compliance with respiratory medication;Verbalizes importance of monitoring SPO2 with pulse oximeter and return demonstration;Demonstrates proper use of MDI's Exhibits proper breathing techniques, such as pursed lip breathing or other method taught during program session;Maintenance of O2 saturations>88%;Exhibits  compliance with exercise, home  and travel O2 prescription;Compliance with respiratory medication;Verbalizes importance of monitoring SPO2 with pulse oximeter and return demonstration;Demonstrates proper use of MDI's Exhibits proper breathing techniques, such as pursed lip breathing or other method taught during program session;Maintenance of O2 saturations>88%;Exhibits compliance with exercise, home  and travel O2 prescription;Compliance with respiratory medication;Verbalizes importance of monitoring SPO2 with pulse oximeter and return demonstration;Demonstrates proper use of MDI's    Comments Verdella is doing well, she did miss some time after a fall.  She is good about using her PLB and practices it routinely at home.  She is good about wearing her oxygen for activity.  She continues to keep an eye on her saturations as well. Taylinn is doing well with her breathing.  She is compliant with her PLB and oxygen therapy.  She continues to keep an eye on her saturations at home.  While exercising this week at home she did drop to 91% on the bike. Windy is doing well back in rehab.  She has been doing pretty good with her breathing.  She has good days and bad days.  On the bad breathing days, she will use her home oxygen more.  She conitnues to use her PLB routinely.  She is using her nebulizer and inhaler routinely to manage her pulmonary disease.  She is monitoring her saturations and they have been good. Maygen is doing well in rehab.  She is doing well with her oxygen use at home.  She is good about using her oxygen at home.  She did note that her son pushed too hard one day, but then she rested with her oxygen on and was good.  She is good about checking her saturations and using her meds.    Goals/Expected Outcomes Short: Conitneu to work on PLB Long: Conitnue to monitor lungs short; Continue to use PLB  Long; Continue to manage pulmonary disease Short: Continue to use oxygen to help with breathing Long: conitnue to  use PLB routinely Short: Conitnue to use PLB routinely Long: Continue to stay compliant with oxygen at home             Oxygen Discharge (Final Oxygen Re-Evaluation):  Oxygen Re-Evaluation - 09/11/22 0828       Program Oxygen Prescription   Program Oxygen Prescription Continuous;E-Tanks    Liters per minute 2      Home Oxygen   Home Oxygen Device E-Tanks;Home Concentrator    Sleep Oxygen Prescription Continuous    Liters per minute 2    Home Exercise Oxygen Prescription Continuous    Liters per minute 2    Home Resting Oxygen Prescription None    Compliance with Home Oxygen Use Yes      Goals/Expected Outcomes   Short Term  Goals To learn and demonstrate proper pursed lip breathing techniques or other breathing techniques. ;To learn and exhibit compliance with exercise, home and travel O2 prescription;To learn and understand importance of monitoring SPO2 with pulse oximeter and demonstrate accurate use of the pulse oximeter.;To learn and understand importance of maintaining oxygen saturations>88%;To learn and demonstrate proper use of respiratory medications    Long  Term Goals Exhibits proper breathing techniques, such as pursed lip breathing or other method taught during program session;Maintenance of O2 saturations>88%;Exhibits compliance with exercise, home  and travel O2 prescription;Compliance with respiratory medication;Verbalizes importance of monitoring SPO2 with pulse oximeter and return demonstration;Demonstrates proper use of MDI's    Comments Modine is doing well in rehab.  She is doing well with her oxygen use at home.  She is good about using her oxygen at home.  She did note that her son pushed too hard one day, but then she rested with her oxygen on and was good.  She is good about checking her saturations and using her meds.    Goals/Expected Outcomes Short: Conitnue to use PLB routinely Long: Continue to stay compliant with oxygen at home             Initial  Exercise Prescription:   Perform Capillary Blood Glucose checks as needed.  Exercise Prescription Changes:   Exercise Prescription Changes     Row Name 04/25/22 1000 05/08/22 1300 05/22/22 1400 06/05/22 1400 08/13/22 1500     Response to Exercise   Blood Pressure (Admit) 98/60 112/74 112/64 102/60 124/72   Blood Pressure (Exercise) 114/64 -- -- -- --   Blood Pressure (Exit) 94/62 122/72 102/60 112/64 124/72   Heart Rate (Admit) 74 bpm 89 bpm 78 bpm 109 bpm 77 bpm   Heart Rate (Exercise) 109 bpm 102 bpm 107 bpm 140 bpm 94 bpm   Heart Rate (Exit) 89 bpm 94 bpm 82 bpm 116 bpm 88 bpm   Oxygen Saturation (Admit) 99 % 99 % 98 % 98 % 99 %   Oxygen Saturation (Exercise) 96 % 96 % 97 % 96 % 97 %   Oxygen Saturation (Exit) 97 % 98 % 98 % 97 % 98 %   Rating of Perceived Exertion (Exercise) Perceived Dyspnea (Exercise) Symptoms SOB SOB SOB SOB SOB   Comments -- -- -- -- return back since December   Duration Continue with 30 min of aerobic exercise without signs/symptoms of physical distress. Continue with 30 min of aerobic exercise without signs/symptoms of physical distress. Continue with 30 min of aerobic exercise without signs/symptoms of physical distress. Continue with 30 min of aerobic exercise without signs/symptoms of physical distress. Continue with 30 min of aerobic exercise without signs/symptoms of physical distress.   Intensity THRR unchanged THRR unchanged THRR unchanged THRR unchanged THRR unchanged     Progression   Progression Continue to progress workloads to maintain intensity without signs/symptoms of physical distress. Continue to progress workloads to maintain intensity without signs/symptoms of physical distress. Continue to progress workloads to maintain intensity without signs/symptoms of physical distress. Continue to progress workloads to maintain intensity without signs/symptoms of physical distress. Continue to progress workloads to maintain  intensity without signs/symptoms of physical distress.   Average METs 2.5 2.27 1.88 2.29 2.28     Resistance Training   Training Prescription Yes Yes Yes Yes Yes   Weight 3 lb 2 lb 3 lb 3 lb 3 lb  Reps 10-15 10-15 10-15 10-15 10-15     Interval Training   Interval Training No -- No No No     Oxygen   Oxygen Continuous Continuous Continuous Continuous Continuous   Liters 2 2 2 2 2      Recumbant Bike   Level -- 1 -- 2 2   Watts -- -- -- -- 19   Minutes -- 15 -- 15 15   METs -- 2.51 -- 1.52 2.79     NuStep   Level 3 3 3  -- 3   Minutes 15 30 15  -- 15   METs 2.8 2.3 -- -- 2.6     T5 Nustep   Level -- 1 -- 2 1   Minutes -- 15 -- 15 15   METs -- -- -- 1.8 2     Biostep-RELP   Level 2 1 1 3  --   SPM 50 -- -- -- --   Minutes 15 15 15 15  --   METs 2 2 2 3  --     Track   Laps 11 -- 10 15 14    Minutes 15 -- 15 15 15    METs 1.6 -- 1.54 1.82 1.76     Home Exercise Plan   Plans to continue exercise at Home (comment)  walking, recumbent bike, bands, weights Home (comment)  walking, recumbent bike, bands, weights Home (comment)  walking, recumbent bike, bands, weights Home (comment)  walking, recumbent bike, bands, weights Home (comment)  walking, recumbent bike, bands, weights   Frequency Add 2 additional days to program exercise sessions. Add 2 additional days to program exercise sessions. Add 2 additional days to program exercise sessions. Add 2 additional days to program exercise sessions. Add 2 additional days to program exercise sessions.   Initial Home Exercises Provided 04/05/22 04/05/22 04/05/22 04/05/22 04/05/22     Oxygen   Maintain Oxygen Saturation 88% or higher 88% or higher 88% or higher 88% or higher 88% or higher    Row Name 08/28/22 1400 09/11/22 1400 09/27/22 1300 10/09/22 1500       Response to Exercise   Blood Pressure (Admit) 110/64 120/68 122/72 112/64    Blood Pressure (Exit) 108/66 104/60 102/64 112/56    Heart Rate (Admit) 100 bpm 85 bpm 96 bpm 92 bpm     Heart Rate (Exercise) 116 bpm 96 bpm 113 bpm 115 bpm    Heart Rate (Exit) 92 bpm 84 bpm 90 bpm 101 bpm    Oxygen Saturation (Admit) 98 % 99 % 98 % 98 %    Oxygen Saturation (Exercise) 96 % 97 % 91 % 96 %    Oxygen Saturation (Exit) 98 % 99 % 99 % 98 %    Rating of Perceived Exertion (Exercise) 13 15 15 15     Perceived Dyspnea (Exercise) 2 2 3 3     Symptoms SOB SOB SOB SOB    Duration Continue with 30 min of aerobic exercise without signs/symptoms of physical distress. Continue with 30 min of aerobic exercise without signs/symptoms of physical distress. Continue with 30 min of aerobic exercise without signs/symptoms of physical distress. Continue with 30 min of aerobic exercise without signs/symptoms of physical distress.    Intensity THRR unchanged THRR unchanged THRR unchanged THRR unchanged      Progression   Progression Continue to progress workloads to maintain intensity without signs/symptoms of physical distress. Continue to progress workloads to maintain intensity without signs/symptoms of physical distress. Continue to progress workloads to maintain intensity without signs/symptoms  of physical distress. Continue to progress workloads to maintain intensity without signs/symptoms of physical distress.    Average METs 2.55 2.26 2.61 3.07      Resistance Training   Training Prescription Yes Yes Yes Yes    Weight 4 lb 4 lb 2 lb 2 lb    Reps 10-15 10-15 10-15 10-15      Interval Training   Interval Training No No No No      Oxygen   Oxygen Continuous Continuous Continuous Continuous    Liters 2 2 2 2       Recumbant Bike   Level 2 2 2 4     Watts 25 19 24 24     Minutes 15 15 15 15     METs 3.06 2.79 3.02 3.07      NuStep   Level -- 1 3 4     Minutes -- 15 15 15     METs -- 2 2.9 3.5      T5 Nustep   Level 2 -- 2 4    Minutes 15 -- 15 15    METs 2.1 -- 1.9 2.3      Biostep-RELP   Level 1 1 3 4     Minutes 15 15 15 15     METs 3.1 2 3 2       Track   Laps 25 -- 15 --     Minutes 15 -- 15 --    METs 2.36 -- 1.82 --      Home Exercise Plan   Plans to continue exercise at Home (comment)  walking, recumbent bike, bands, weights Home (comment)  walking, recumbent bike, bands, weights Home (comment)  walking, recumbent bike, bands, weights Home (comment)  walking, recumbent bike, bands, weights    Frequency Add 2 additional days to program exercise sessions. Add 2 additional days to program exercise sessions. Add 2 additional days to program exercise sessions. Add 2 additional days to program exercise sessions.    Initial Home Exercises Provided 04/05/22 04/05/22 04/05/22 04/05/22      Oxygen   Maintain Oxygen Saturation 88% or higher 88% or higher 88% or higher 88% or higher             Exercise Comments:   Exercise Goals and Review:   Exercise Goals Re-Evaluation :  Exercise Goals Re-Evaluation     Row Name 04/25/22 1107 05/01/22 0737 05/08/22 1347 05/22/22 1454 05/31/22 0751     Exercise Goal Re-Evaluation   Exercise Goals Review Increase Physical Activity;Increase Strength and Stamina;Understanding of Exercise Prescription Increase Physical Activity;Increase Strength and Stamina;Understanding of Exercise Prescription Increase Physical Activity;Increase Strength and Stamina;Understanding of Exercise Prescription Increase Physical Activity;Increase Strength and Stamina;Understanding of Exercise Prescription Increase Physical Activity;Increase Strength and Stamina;Understanding of Exercise Prescription   Comments Tuyen continues to do well in rehab. She was able to increase her workload to level 3 on the T4 Nustep for part of the time. It was challenging for her but happy she was able to do it. She also walked 11 laps on the track which was her first time walking. We will continue to monitor. Arabell missed the last couple of weeks due to family medical concerns and then a fall at home.  Prior to her fall she was using her bike for 20 min each day and 10 min  worth of weights each day.  She  has also been working on her breathing too. Makenah is doing well in rehab since returning from her fall. She was able to  tolerate the T4 at level 3 for 30 minutes. She also was able to work at an average overall MET level of 2.27 METs. She did decrease her hand weights from 3 lb to 1 lb hand weights. We will continue to monitor her progress in the program. Jaquisha is slowing getting back into the routine after she was out for a week or so. Trachelle was able to get 10 laps on the track and hope to see more walking incorporated into her exercise regimen. She is back up to 3 lbs with her handweights and staying consistent at level 3 on the T4 Nustep. She would benefit from increasing her Biostep to level 2. Will continue to monitor. Hawa is doing well in rehab.  She is riding her recumbent bike at home for 20 min and then weights and back to bike again.  She is going to take home a set of 3 lb weights to increase.  She continues to improve her strength and stamina.  She stopped doing some of her PT exercises but was encouraged to get back to them again. She wants to be independent in self care so she keeps working.   Expected Outcomes Short: Work on T4 Nustep at level 3 the entire 15 minutes Long: Continue to increase overall MET level Short: Get back to exercise routine again Long: Continue to improve stamina Short: Continue to try walking more. Long: Continue to increase strength and stamina. Short: Increase to level 2 for Biostep Long: Continue to increase overall MET level Short: Get back to PT exercises for shoulder Long: Continue to improve stamina    Row Name 06/05/22 1443 06/20/22 1127 07/03/22 0846 07/16/22 1543 08/02/22 0938     Exercise Goal Re-Evaluation   Exercise Goals Review Increase Physical Activity;Increase Strength and Stamina;Understanding of Exercise Prescription Increase Physical Activity;Increase Strength and Stamina;Understanding of Exercise Prescription  Increase Physical Activity;Increase Strength and Stamina;Understanding of Exercise Prescription Increase Physical Activity;Increase Strength and Stamina;Understanding of Exercise Prescription --   Comments Margaret is doing well in the program. She recently improved her overall average MET level back up above 2 METs. She has also done well with her seated machines as she improved to level 2 on the T5, level 3 on the biostep, and level 2 on the recumbent bike. She was able to walk up to 15 laps on the track as well. We will continue to monitor her progress in the program. Ulanda has not attended since last review as her husband had surgery done and is now not able to get transportation to rehab. We will follow up with patient to see when she is able to return and hope to see good attendance thereafter. Viva has not attended since last review as her husband had surgery done and she is now not able to get transportation to rehab. We will follow up with patient to see when she is able to return and hope to see good attendance thereafter. Nazareth ia waiting to hear from her husband's doctor on whether or not he is cleared to drive so she is able to have stable transportation again. Their follow up appt is this week. Rakel ia waiting to hear from her husband's doctor on whether or not he is cleared to drive so she is able to have stable transportation again. She reports that she has been doing some exercise at home. We will continue to follow up with her on when she is able to return to the program.   Expected Outcomes  Short: Continue to push for more laps on the track. Long: Continue to improve strength and stamina. Short: Return to rehab when transportation allows Long: Graduate from the [employer] Short: Return to rehab when transportation allows Long: Graduate from the [employer] Short: Return to rehab when transportation allows Long: Graduate from the [employer] Short: Return to rehab when  transportation allows. Long: Graduate from the [employer] program.    Row Name 08/13/22 1539 08/16/22 0819 08/28/22 1436 09/11/22 0814 09/11/22 1438     Exercise Goal Re-Evaluation   Exercise Goals Review Increase Physical Activity;Increase Strength and Stamina;Understanding of Exercise Prescription Increase Physical Activity;Increase Strength and Stamina;Understanding of Exercise Prescription Increase Physical Activity;Increase Strength and Stamina;Understanding of Exercise Prescription Increase Physical Activity;Increase Strength and Stamina;Understanding of Exercise Prescription Increase Physical Activity;Increase Strength and Stamina;Understanding of Exercise Prescription   Comments Brooklynne returned back to rehab after being out due to her husband's surgery. She eased back into her exercise regimen and will gradually increase it over time. She was able to still walk 14 laps on the track and exercise at level 3 on the T4 Nustep. We will continue to monitor as she slowly gets back into her routine again. Caera returned to rehab last week.  She has been using her bike at home while she was out.  She has been doing 20 min on, 20 min active rest (weights or house work), and 20 min again on bike.  She has returned without any loss to stamina.  She is interested in trying treadmill next week. Gayatri is doing well in rehab. She recently increased her overall average MET level to 2.55 METs. She also improved to level 2 on the T5 nustep and walked up to 25 laps on the track. She increased from 3 lb to 4 lb hand weights for resistance training as well. We will continue to monitor her progress in the program. Rilynne is doing well in rehab.  She is still using her bike some at home.  She has been running to appointments during day.  But she squeezed in some time on her bike.  She is aiming to get in 20 min on her bike at least one other day of the week. Her stamina continues to improved overall.  She still has days that she  feels like she has no energy.  She is determined to finish up and continue to exercise and improve. Esti continues to do well in rehab. She had been out for a little bit which put her a little behind on exercise. She is slowly working her way up. She was able to exercise at level 2 working up to 19 watts. We hope to see her add in walking to her regimen again. Her oxygen saturations stay above 88%. She could benefit from increasing her seated machines, both her BS and T4 to level 2. We will continue to monitor.   Expected Outcomes Short: Ease back into exercise prescription slowly Long: Continue to increase overall MET level and stamina Short: Try out treadmill Long: conitnue to exercise independently Short: Try level 2 on the biostep. Long: Continue to improve strength and stamina. -- Short: Increased seated machines to level 2 and add walking back in Long: Continue to increase overall MET level and strength    Row Name 09/27/22 1348 10/09/22 0811 10/09/22 1527         Exercise Goal Re-Evaluation   Exercise Goals Review Increase Physical Activity;Increase Strength and Stamina;Understanding of Exercise Prescription Increase Physical Activity;Increase  Strength and Stamina;Understanding of Exercise Prescription Increase Physical Activity;Increase Strength and Stamina;Understanding of Exercise Prescription     Comments Khushi is doing well in rehab. She recently improved her overall average MET level to 2.61 METs. She also improved to level 3 on the biostep and T4 nustep. She did see a decrease in her laps walked on the track down to 15 laps. We will continue to monitor her progress in the program. Irais has done well in rehab. She is set to graduate and will be joining the [facility] to continue to exercise. She is hesistant to join classes there but we encouraged her to try them out if she wanted to. Verma continues to do well in rehab. She increased to level 4 on all seated machines. She also completed her  post and improved by 50%! She will be graduating soon and we will continue to monitor until she completes the program.     Expected Outcomes Short: Increase walking laps back up to 25 laps. Long: Continue to improve strength and stamina. Continue to exercise indpendently Short: Graduate Long: Continue to increase overall MET level and stamina              Discharge Exercise Prescription (Final Exercise Prescription Changes):  Exercise Prescription Changes - 10/09/22 1500       Response to Exercise   Blood Pressure (Admit) 112/64    Blood Pressure (Exit) 112/56    Heart Rate (Admit) 92 bpm    Heart Rate (Exercise) 115 bpm    Heart Rate (Exit) 101 bpm    Oxygen Saturation (Admit) 98 %    Oxygen Saturation (Exercise) 96 %    Oxygen Saturation (Exit) 98 %    Rating of Perceived Exertion (Exercise) 15    Perceived Dyspnea (Exercise) 3    Symptoms SOB    Duration Continue with 30 min of aerobic exercise without signs/symptoms of physical distress.    Intensity THRR unchanged      Progression   Progression Continue to progress workloads to maintain intensity without signs/symptoms of physical distress.    Average METs 3.07      Resistance Training   Training Prescription Yes    Weight 2 lb    Reps 10-15      Interval Training   Interval Training No      Oxygen   Oxygen Continuous    Liters 2      Recumbant Bike   Level 4    Watts 24    Minutes 15    METs 3.07      NuStep   Level 4    Minutes 15    METs 3.5      T5 Nustep   Level 4    Minutes 15    METs 2.3      Biostep-RELP   Level 4    Minutes 15    METs 2      Home Exercise Plan   Plans to continue exercise at Home (comment)   walking, recumbent bike, bands, weights   Frequency Add 2 additional days to program exercise sessions.    Initial Home Exercises Provided 04/05/22      Oxygen   Maintain Oxygen Saturation 88% or higher             Nutrition:  Target Goals: Understanding of  nutrition guidelines, daily intake of sodium 1500mg , cholesterol 200mg , calories 30% from fat and 7% or less from saturated fats, daily to have 5 or  more servings of fruits and vegetables.  Education: All About Nutrition: -Group instruction provided by verbal, written material, interactive activities, discussions, models, and posters to present general guidelines for heart healthy nutrition including fat, fiber, MyPlate, the role of sodium in heart healthy nutrition, utilization of the nutrition label, and utilization of this knowledge for meal planning. Follow up email sent as well. Written material given at graduation. Flowsheet Row Pulmonary Rehab from 10/04/2022 in [facility] Cardiac and Pulmonary Rehab  Date 10/04/22  Alberteen Sam 2]  Educator [facility]  Instruction Review Code 1- Verbalizes Understanding       Biometrics:   Post Biometrics - 09/28/22 0821        Post  Biometrics   Height 5' 3.5" (1.613 m)    Weight 160 lb 4.8 oz (72.7 kg)    Waist Circumference 34 inches    Hip Circumference 37.5 inches    Waist to Hip Ratio 0.91 %    BMI (Calculated) 27.95    Single Leg Stand 5.6 seconds             Nutrition Therapy Plan and Nutrition Goals:   Nutrition Assessments:  MEDIFICTS Score Key: ?70 Need to make dietary changes  40-70 Heart Healthy Diet ? 40 Therapeutic Level Cholesterol Diet  Flowsheet Row Pulmonary Rehab from 10/04/2022 in [facility] Cardiac and Pulmonary Rehab  Picture Your Plate Total Score on Discharge 77      Picture Your Plate Scores: <16 Unhealthy dietary pattern with much room for improvement. 41-50 Dietary pattern unlikely to meet recommendations for good health and room for improvement. 51-60 More healthful dietary pattern, with some room for improvement.  >60 Healthy dietary pattern, although there may be some specific behaviors that could be improved.   Nutrition Goals Re-Evaluation:  Nutrition Goals Re-Evaluation     Row Name 05/01/22 0752 05/31/22 0758  08/16/22 0827 09/11/22 1096       Goals   Nutrition Goal ST: consider adding in a nutritional supplement, practice mechanical eating, eat protein foods first, aim to include fiber, fat, and protein at most meals. LT: Meet energy/protein needs SHort; Continue mechanical eating until full Long: Continue to get in more protein SHort; Continue mechanical eating until full Long: Continue to get in more protein Short: Try meal replacment shakes Long; Conitnue to work on mechanical eating    Comment Statia is practicing mechanical eating as she knows she needs to eat, but still not feeling hungry.  She is still working on getting in more protein.  Her husband is trying to stay on top of her about it and makes sure she is eating daily. Cyenna is doing well. She is now eating when she is hungry.  She has not been doing her mechanincal eating.  She says that her stomach has been hurting which can be related to her increased stress. We talked about the importance of eating to fuel her system.  She was encouraged to try meal replacement shakes or protein boosters.  She said she tried before and it hurt her stomach. Leasha is still not eating a lot and her husband will ask her if she ate.  One meal fills her up but she is unable to eat without a bowel movement.  She is also walking to help with bowel movements.  She was encouraged to continue to use mechanical eating.  She still has not tried meal replacement shakes.  Her husband bought her some, but she has not tried them yet.  She was encouraged to  try blending it with ice to make is more palatable to her. Jatara is doing well in rehab.  She is trying to drink the glucerna.  She says that it feels thick in her mouth. she tried it in blender as well.  She is trying to get in mechanical eating and getting something in.  Yesterday, they made black beans to eat.  She is trying to add in more protein.    Expected Outcome SHort; Continue mechanical eating until full Long: Continue  to get in more protein Short: Find a source of protein she can drink. Long; Continue to work on eating more Short: Try meal replacment shakes Long; Conitnue to work on [employer] eating Short: Keep using replacement shakes Long: Continue to eat routinely             Nutrition Goals Discharge (Final Nutrition Goals Re-Evaluation):  Nutrition Goals Re-Evaluation - 09/11/22 0821       Goals   Nutrition Goal Short: Try meal replacment shakes Long; Conitnue to work on mechanical eating    Comment Makaley is doing well in rehab.  She is trying to drink the glucerna.  She says that it feels thick in her mouth. she tried it in blender as well.  She is trying to get in mechanical eating and getting something in.  Yesterday, they made black beans to eat.  She is trying to add in more protein.    Expected Outcome Short: Keep using replacement shakes Long: Continue to eat routinely             Psychosocial: Target Goals: Acknowledge presence or absence of significant depression and/or stress, maximize coping skills, provide positive support system. Participant is able to verbalize types and ability to use techniques and skills needed for reducing stress and depression.   Education: Stress, Anxiety, and Depression - Group verbal and visual presentation to define topics covered.  Reviews how body is impacted by stress, anxiety, and depression.  Also discusses healthy ways to reduce stress and to treat/manage anxiety and depression.  Written material given at graduation. Flowsheet Row Pulmonary Rehab from 10/04/2022 in [facility] Cardiac and Pulmonary Rehab  Date 04/11/22  Educator [facility]  Instruction Review Code 1- [employer] Understanding       Education: Sleep Hygiene -Provides group verbal and written instruction about how sleep can affect your health.  Define sleep hygiene, discuss sleep cycles and impact of sleep habits. Review good sleep hygiene tips.    Initial Review & Psychosocial  Screening:   Quality of Life Scores:  Scores of 19 and below usually indicate a poorer quality of life in these areas.  A difference of  2-3 points is a clinically meaningful difference.  A difference of 2-3 points in the total score of the Quality of Life Index has been associated with significant improvement in overall quality of life, self-image, physical symptoms, and general health in studies assessing change in quality of life.  PHQ-9: Review Flowsheet  More data exists      10/04/2022 09/11/2022 08/16/2022 05/31/2022 05/01/2022  Depression screen PHQ 2/9  Decreased Interest 2 2 2 1 1   Down, Depressed, Hopeless 1 1 1 1 1   PHQ - 2 Score 3 3 3 2 2   Altered sleeping 2 0 1 0 0  Tired, decreased energy 3 2 2 3 3   Change in appetite 2 1 1 2 1   Feeling bad or failure about yourself  1 0 0 1 1  Trouble concentrating 2 1 2  1 1  Moving slowly or fidgety/restless 2 1 2 1 1   Suicidal thoughts 0 0 0 0 0  PHQ-9 Score 15 8 11 10 9   Difficult doing work/chores - Somewhat difficult Somewhat difficult Somewhat difficult Somewhat difficult   Interpretation of Total Score  Total Score Depression Severity:  1-4 = Minimal depression, 5-9 = Mild depression, 10-14 = Moderate depression, 15-19 = Moderately severe depression, 20-27 = Severe depression   Psychosocial Evaluation and Intervention:   Psychosocial Re-Evaluation:  Psychosocial Re-Evaluation     Row Name 05/01/22 0739 05/31/22 0755 08/16/22 0825 09/11/22 0816 10/09/22 0817     Psychosocial Re-Evaluation   Current issues with Current Depression;Current Anxiety/Panic;Current Psychotropic Meds;Current Sleep Concerns;Current Stress Concerns Current Depression;Current Anxiety/Panic;Current Psychotropic Meds;Current Sleep Concerns;Current Stress Concerns Current Depression;Current Anxiety/Panic;Current Psychotropic Meds;Current Sleep Concerns;Current Stress Concerns Current Depression;Current Anxiety/Panic;Current Psychotropic Meds;Current Sleep  Concerns;Current Stress Concerns Current Depression;Current Anxiety/Panic;Current Psychotropic Meds;Current Sleep Concerns;Current Stress Concerns   Comments Zaira has been doing well up until a fall last week.  Her PHQ has greatly improved from 17 down to 9.  She is feeling better up until her fall.  She is sleeping better and trying to find the good.  She just get frustrated that she is unable to do everything she wants to.  She wants to continue to build up her strength and stamina.  She is feeling like her mood is balanced Caliyah is doing well in rehab.  She saw her doctor yesterday and they increased her depression meds to help her cope better.  Her husband has just been diagnosised with cancer and they are now undergoing lots of appts and blood work to [employer] treatment.  He will have surgery in January and they hope to remove it.  She is sleeping good despite getting up in the night at eat.  Her PHQ has increased slightly, but she just had a med increase and has a lot of stress with her husband recently Veola has been out with her husband's surgery. She has been exercising at home and both are happy to have her back.  Her PHQ did worsen by a point, but she has been having issues with sleep and her medications.  They have been causing hallucincations.  She talked to her doctor about it, and they have reduced the dosage and it seems to be helping now.  She is happy to be back in rehab and wants to try out treadmill to push herself. Svetlana is doing well in rehab.  Today is not a good mental health day.  Her husband says that she has been hateful recently.  She continues to cope with her illness and her husband's cancer. Her PHQ has improved some more.  She said she let onions burn yesterday and no awareness of what was going on.  She was concerned about this.  She did talk to doctor about sleeping and has a follow up with her tomorrow to get a brace to help leg balance and stay supported. Jordi is set to  graduate. She is feeling good mentally and trying to stay positive.  She has enjoyed classes and getting out to meet people and getting out and about again.  She has learned a lot in education and about her limits.   Expected Outcomes Short: Get back to routine exercise again Long: Continue to exercise to build stamina. Short; Conitnue to cope with husbands diagnosis Long: Conitnue to exercise for mental boost Short: Continue to work with doctor abdout sleep Long:  Conitnue to exericse for mental  boost Short: Talk to doctor about leg Long Continue to exercise for mental boost Conitnue to exercise for mental boost and to stay positive   Interventions Stress management education;Encouraged to attend Pulmonary Rehabilitation for the exercise Stress management education;Encouraged to attend Pulmonary Rehabilitation for the exercise Stress management education;Encouraged to attend Pulmonary Rehabilitation for the exercise Stress management education;Encouraged to attend Pulmonary Rehabilitation for the exercise Encouraged to attend Pulmonary Rehabilitation for the exercise   Continue Psychosocial Services  Follow up required by staff Follow up required by staff Follow up required by staff -- --            Psychosocial Discharge (Final Psychosocial Re-Evaluation):  Psychosocial Re-Evaluation - 10/09/22 0817       Psychosocial Re-Evaluation   Current issues with Current Depression;Current Anxiety/Panic;Current Psychotropic Meds;Current Sleep Concerns;Current Stress Concerns    Comments Dwanna is set to graduate. She is feeling good mentally and trying to stay positive.  She has enjoyed classes and getting out to meet people and getting out and about again.  She has learned a lot in education and about her limits.    Expected Outcomes Conitnue to exercise for mental boost and to stay positive    Interventions Encouraged to attend Pulmonary Rehabilitation for the exercise              Education: Education Goals: Education classes will be provided on a weekly basis, covering required topics. Participant will state understanding/return demonstration of topics presented.  Learning Barriers/Preferences:   General Pulmonary Education Topics:  Infection Prevention: - Provides verbal and written material to individual with discussion of infection control including proper hand washing and proper equipment cleaning during exercise session. Flowsheet Row Pulmonary Rehab from 10/04/2022 in [facility] Cardiac and Pulmonary Rehab  Date 02/26/22  Educator [facility]  Instruction Review Code 1- Verbalizes Understanding       Falls Prevention: - Provides verbal and written material to individual with discussion of falls prevention and safety. Flowsheet Row Pulmonary Rehab from 10/04/2022 in [facility] Cardiac and Pulmonary Rehab  Date 02/26/22  Educator [facility]  Instruction Review Code 1- Verbalizes Understanding       Chronic Lung Disease Review: - Group verbal instruction with posters, models, PowerPoint presentations and videos,  to review new updates, new respiratory medications, new advancements in procedures and treatments. Providing information on websites and "800" numbers for continued self-education. Includes information about supplement oxygen, available portable oxygen systems, continuous and intermittent flow rates, oxygen safety, concentrators, and Medicare reimbursement for oxygen. Explanation of Pulmonary Drugs, including class, frequency, complications, importance of spacers, rinsing mouth after steroid MDI's, and proper cleaning methods for nebulizers. Review of basic lung anatomy and physiology related to function, structure, and complications of lung disease. Review of risk factors. Discussion about methods for diagnosing sleep apnea and types of masks and machines for OSA. Includes a review of the use of types of environmental controls: home humidity, furnaces, filters, dust mite/pet  prevention, HEPA vacuums. Discussion about weather changes, air quality and the benefits of nasal washing. Instruction on Warning signs, infection symptoms, calling MD promptly, preventive modes, and value of vaccinations. Review of effective airway clearance, coughing and/or vibration techniques. Emphasizing that all should Create an Action Plan. Written material given at graduation. Flowsheet Row Pulmonary Rehab from 10/04/2022 in [facility] Cardiac and Pulmonary Rehab  Education need identified 02/26/22  Date 04/05/22  Educator [facility]  Instruction Review Code 1- Verbalizes Understanding       AED/CPR: -  Group verbal and written instruction with the use of models to demonstrate the basic use of the AED with the basic ABC's of resuscitation.    Anatomy and Cardiac Procedures: - Group verbal and visual presentation and models provide information about basic cardiac anatomy and function. Reviews the testing methods done to diagnose heart disease and the outcomes of the test results. Describes the treatment choices: Medical Management, Angioplasty, or Coronary Bypass Surgery for treating various heart conditions including Myocardial Infarction, Angina, Valve Disease, and Cardiac Arrhythmias.  Written material given at graduation. Flowsheet Row Pulmonary Rehab from 10/04/2022 in [facility] Cardiac and Pulmonary Rehab  Date 05/24/22  Educator [facility]  Instruction Review Code 1- Verbalizes Understanding       Medication Safety: - Group verbal and visual instruction to review commonly prescribed medications for heart and lung disease. Reviews the medication, class of the drug, and side effects. Includes the steps to properly store meds and maintain the prescription regimen.  Written material given at graduation. Flowsheet Row Pulmonary Rehab from 10/04/2022 in [facility] Cardiac and Pulmonary Rehab  Date 08/09/22  Educator [facility]  Instruction Review Code 1- Verbalizes Understanding       Other: -Provides group and  verbal instruction on various topics (see comments)   Knowledge Questionnaire Score:  Knowledge Questionnaire Score - 10/04/22 0814       Knowledge Questionnaire Score   Pre Score 12/18    Post Score 14/18              Core Components/Risk Factors/Patient Goals at Admission:   Education:Diabetes - Individual verbal and written instruction to review signs/symptoms of diabetes, desired ranges of glucose level fasting, after meals and with exercise. Acknowledge that pre and post exercise glucose checks will be done for 3 sessions at entry of program. Flowsheet Row Pulmonary Rehab from 10/04/2022 in [facility] Cardiac and Pulmonary Rehab  Date 02/26/22  Educator [facility]  Instruction Review Code 1- Verbalizes Understanding       Know Your Numbers and Heart Failure: - Group verbal and visual instruction to discuss disease risk factors for cardiac and pulmonary disease and treatment options.  Reviews associated critical values for Overweight/Obesity, Hypertension, Cholesterol, and Diabetes.  Discusses basics of heart failure: signs/symptoms and treatments.  Introduces Heart Failure Zone chart for action plan for heart failure.  Written material given at graduation. Flowsheet Row Pulmonary Rehab from 10/04/2022 in [facility] Cardiac and Pulmonary Rehab  Date 08/16/22  Educator [facility]  Instruction Review Code 1- Verbalizes Understanding       Core Components/Risk Factors/Patient Goals Review:   Goals and Risk Factor Review     Row Name 05/01/22 0754 05/31/22 0803 08/16/22 0830 09/11/22 0823       Core Components/Risk Factors/Patient Goals Review   Personal Goals Review Weight Management/Obesity;Improve shortness of breath with ADL's;Increase knowledge of respiratory medications and ability to use respiratory devices properly.;Diabetes;Hypertension;Lipids Weight Management/Obesity;Improve shortness of breath with ADL's;Increase knowledge of respiratory medications and ability to use respiratory devices  properly.;Diabetes;Hypertension;Lipids Weight Management/Obesity;Improve shortness of breath with ADL's;Increase knowledge of respiratory medications and ability to use respiratory devices properly.;Diabetes;Hypertension;Lipids Weight Management/Obesity;Improve shortness of breath with ADL's;Increase knowledge of respiratory medications and ability to use respiratory devices properly.;Diabetes;Hypertension;Lipids    Review Braxtyn is doing well in rehab. She missed some time with medical appointments and a fall last week.  She is doing better with her breahing and practices it at home.  She is keeping her weight steady and watching her pressures and sugars at home with her  husbands help.  She is good about using her nebulizer and inhalers. Orlinda is doing well in rehab.  Cheral is losing weight.  We talked about adding in protein to her diet.  She is finding that she is doing better with her breathing for most part.  She still has some bad breathing days, but it is getting better.  She continues to build her stamina to be able to do to do more around the house.  Her blood sugars are stable feeling and her Alc was 6.6!!  She was unable to get a new meter on her insurance so her doctor ordered her another meter.   She continues to work to get it down.  Her pressures have been on the lower side but she has not had problems with it. Jessamyn returned to rehab after being out with her husband.  Her weight was up 4 lb while she was out.  She hopes to maintain her weight, but she is not eating like she should and we talked about meal replacement shakes.  Her sugars have been doing well at home.  Her pressures have been doing well too. Her breathing has good days and bad days.  She has been wearing her oxygen more at home.  She continues to use her PLB.  She is doing well on her inhaler and nebulizer. Norinne is doing well in rehab.  Her weight has been going up some again. She was 165 lb today.  She continues to try to maintain  her weight and make sure she goes to bathrrom each day.  We talked about making sure she is getting in enough fluids and fiber to help keep her regular.  She is eating pears to help.  Her sugars and pressures continue to do well at home.  Her breathing is doing well for most part, but still has a few bad days.  She will stop and rest when she gets too SOB and use her PLB.  She continues to do well on her meds as well.    Expected Outcomes Short: Conitnue to work on breathing Long: Conitnue to monitor weight to not lose SHort Get new meter Long: Continue to monitor risk factors Short: Try meal replacement shakes Long: Continue to monitor risk factors Short: Keep eating and watching weight Long: Continue to montior risk factors             Core Components/Risk Factors/Patient Goals at Discharge (Final Review):   Goals and Risk Factor Review - 09/11/22 0823       Core Components/Risk Factors/Patient Goals Review   Personal Goals Review Weight Management/Obesity;Improve shortness of breath with ADL's;Increase knowledge of respiratory medications and ability to use respiratory devices properly.;Diabetes;Hypertension;Lipids    Review Dayannara is doing well in rehab.  Her weight has been going up some again. She was 165 lb today.  She continues to try to maintain her weight and make sure she goes to bathrrom each day.  We talked about making sure she is getting in enough fluids and fiber to help keep her regular.  She is eating pears to help.  Her sugars and pressures continue to do well at home.  Her breathing is doing well for most part, but still has a few bad days.  She will stop and rest when she gets too SOB and use her PLB.  She continues to do well on her meds as well.    Expected Outcomes Short: Keep eating and watching weight Long: Continue  to montior risk factors             ITP Comments:  ITP Comments     Row Name 04/18/22 0935 05/01/22 0736 05/16/22 0808 06/04/22 1242 06/13/22 1021    ITP Comments 30 Day review completed. Medical Director ITP review done, changes made as directed, and signed approval by Medical Director.    Reesa has continued PR and is showing improvement with independence of movement Kaybree had a fall off the toliet last week which kept her out for an extra day. 30 Day review completed. Medical Director ITP review done, changes made as directed, and signed approval by Medical Director. Dontavia will be out for several weeks as her husband is getting surgery and she will not have transportation to rehab- she also wants to be there for for him. We will place patient on hold and contact her after the New Year to see when she will return. 30 Day review completed. Medical Director ITP review done, changes made as directed, and signed approval by Medical Director.    Row Name 06/19/22 1133 07/03/22 1423 08/07/22 0754 08/08/22 1259 09/05/22 0824   ITP Comments Idalie has been out as she has not had transportation due to her husband's surgery.  He is still not able to drive and currently on narcotics.  He has a follow up next week but still not able to drive and then sit.  Thus, she will not be able to attend due to transportation issues.  We will leave her out on hold until he is able to drive again. She is riding her bike and using her weights at home in the mean time. Called patient to check in.  She is doing her exercise at home, but her husband still cannot drive.  His next follow up in on 07/20/22.  We have extended her medical hold until then.  She will keep [place] posted on his ability to drive. Italia returned today.  She was able to return to her normal exercise routine in rehab. We will follow up with her again once she has regular attendance. 30 day review completed. ITP sent to Dr. Jinny Sanders, Medical Director of  Pulmonary Rehab. Continue with ITP unless changes are made by physician. 30 Day review completed. Medical Director ITP review done, changes made as directed, and  signed approval by Medical Director.    Row Name 10/03/22 0920 10/11/22 0755         ITP Comments 30 day review completed. ITP sent to Dr. Jinny Sanders, Medical Director of  Pulmonary Rehab. Continue with ITP unless changes are made by physician. Gaynelle graduated today from  rehab with 35 sessions completed.  Details of the patient's exercise prescription and what She needs to do in order to continue the prescription and progress were discussed with patient.  Patient was given a copy of prescription and goals.  Patient verbalized understanding.  Lashondra plans to continue to exercise by walking, recumbent bike, bands, and weights.               Comments: Discharge ITP

## 2022-10-11 NOTE — Progress Notes (Signed)
Discharge Summary: Ruth White (DOB: Apr 09, 1961)  Stanton Kidney graduated today from  rehab with 35 sessions completed.  Details of the patient's exercise prescription and what She needs to do in order to continue the prescription and progress were discussed with patient.  Patient was given a copy of prescription and goals.  Patient verbalized understanding.  Shailynn plans to continue to exercise by walking, recumbent bike, bands, and weights.   6 Minute Walk     Row Name 09/28/22 0819         6 Minute Walk   Phase Discharge     Distance 1000 feet     Distance % Change 100 %     Distance Feet Change 500 ft     Walk Time 6 minutes     # of Rest Breaks 0     MPH 1.89     METS 3.15     RPE 17     Perceived Dyspnea  3     VO2 Peak 11.03     Symptoms Yes (comment)     Comments SOB, Fatigue     Resting HR 99 bpm     Resting BP 126/64     Resting Oxygen Saturation  94 %     Exercise Oxygen Saturation  during 6 min walk 95 %     Max Ex. HR 132 bpm     Max Ex. BP 132/74     2 Minute Post BP 126/60       Interval HR   1 Minute HR 124     2 Minute HR 132     3 Minute HR 127     4 Minute HR 117     5 Minute HR 122     6 Minute HR 128     2 Minute Post HR 107     Interval Heart Rate? Yes       Interval Oxygen   Interval Oxygen? Yes     Baseline Oxygen Saturation % 94 %     1 Minute Oxygen Saturation % 98 %     1 Minute Liters of Oxygen 2 L  CONTINUOUS     2 Minute Oxygen Saturation % 98 %     2 Minute Liters of Oxygen 2 L     3 Minute Oxygen Saturation % 98 %     3 Minute Liters of Oxygen 2 L     4 Minute Oxygen Saturation % 95 %     4 Minute Liters of Oxygen 2 L     5 Minute Oxygen Saturation % 95 %     5 Minute Liters of Oxygen 2 L     6 Minute Oxygen Saturation % 96 %     6 Minute Liters of Oxygen 2 L     2 Minute Post Oxygen Saturation % 97 %     2 Minute Post Liters of Oxygen 2 L

## 2022-10-12 ENCOUNTER — Other Ambulatory Visit: Payer: Self-pay | Admitting: Oncology

## 2022-10-14 ENCOUNTER — Other Ambulatory Visit: Payer: Self-pay | Admitting: Cardiovascular Disease

## 2022-10-14 DIAGNOSIS — E7800 Pure hypercholesterolemia, unspecified: Secondary | ICD-10-CM

## 2022-10-14 DIAGNOSIS — I700 Atherosclerosis of aorta: Secondary | ICD-10-CM

## 2022-10-15 NOTE — Telephone Encounter (Signed)
Spouse calling again as this has not been completed. Unsure who needs to handle this as PCCs dont per [employer] To reiterate: pt is needing reauthorization for 02 sent to [employer]. Routing to [employer] triage and [facility] traige.Routing as urgent as this has been nearly a month with no response. Please call spouse back to give update.

## 2022-10-15 NOTE — Telephone Encounter (Signed)
The DME companies are who do the authorizations for O2.  Called Melissa with [employer] to see what exactly is needed on pt but unable to reach. Left message for her to return call.   Called and spoke with pt letting her know that I have called [employer] and left them a message to call us back and let her know that once we had more info from [employer] that we would update her when we could and she verbalized understanding.

## 2022-10-17 NOTE — Telephone Encounter (Signed)
Ruth White returned my call and stated that she needs an office visit, qualifying walk and new order.  Called and spoke with patient, scheduled her at [facility] to see Dr. Everardo All for qualifying walk, OV and new oxygen order.  She is scheduled on 11/02/2022 at 8:45 am, advised to arrive by 8:30 am for check in.  She did state that she had a 6 minute walk 2 weeks at pulmonary rehab at [facility].  Advised that if [employer] calls again to just give them the date of her office visit upcoming.  She verbalized understanding.  Nothing further needed.

## 2022-10-17 NOTE — Telephone Encounter (Signed)
Unable to leave VM for Melissa ([employer]) as it is full.  Sent community message.  Will await response.  Called and spoke with patient's husband, Thayer Ohm [facility]), advised that we are waiting to hear back from our contact at [employer] regarding her oxygen and we will contact them back after we hear from them.  He verified understanding.  Nothing further needed.

## 2022-10-18 ENCOUNTER — Telehealth: Payer: Self-pay | Admitting: Cardiovascular Disease

## 2022-10-18 NOTE — Telephone Encounter (Signed)
Pt c/o medication issue:  1. Name of Medication: Evolocumab (REPATHA SURECLICK) 140 MG/ML SOAJ   2. How are you currently taking this medication (dosage and times per day)? As prescribed   3. Are you having a reaction (difficulty breathing--STAT)? No   4. What is your medication issue? Patient's husbands is calling requesting PA be submitted for the patient's Repatha. He is requesting Thayer Ohm call him back in regards to this. Please advise

## 2022-10-19 ENCOUNTER — Telehealth: Payer: Self-pay

## 2022-10-19 ENCOUNTER — Encounter: Payer: Self-pay | Admitting: Oncology

## 2022-10-19 ENCOUNTER — Other Ambulatory Visit (HOSPITAL_COMMUNITY): Payer: Self-pay

## 2022-10-19 NOTE — Telephone Encounter (Signed)
Pharmacy Patient Advocate Encounter  Prior Authorization for Repatha 140mg /ml has been approved by [employer] (ins).    PA # [id number] Effective dates: 5.3.24 through 11.3.24

## 2022-10-22 NOTE — Telephone Encounter (Signed)
Patient made aware of the approval

## 2022-11-02 ENCOUNTER — Ambulatory Visit (HOSPITAL_BASED_OUTPATIENT_CLINIC_OR_DEPARTMENT_OTHER): Payer: PPO | Admitting: Pulmonary Disease

## 2022-11-02 ENCOUNTER — Encounter (HOSPITAL_BASED_OUTPATIENT_CLINIC_OR_DEPARTMENT_OTHER): Payer: Self-pay | Admitting: Pulmonary Disease

## 2022-11-02 VITALS — BP 118/78 | HR 81 | Temp 98.9°F | Ht 64.0 in | Wt 162.0 lb

## 2022-11-02 DIAGNOSIS — R0602 Shortness of breath: Secondary | ICD-10-CM

## 2022-11-02 DIAGNOSIS — J9611 Chronic respiratory failure with hypoxia: Secondary | ICD-10-CM

## 2022-11-02 MED ORDER — ALBUTEROL SULFATE HFA 108 (90 BASE) MCG/ACT IN AERS: 1.0000 | INHALATION_SPRAY | RESPIRATORY_TRACT | 3 refills | Status: DC | PRN

## 2022-11-02 NOTE — Progress Notes (Unsigned)
Subjective:   PATIENT ID: Ruth White GENDER: female DOB: July 03, 1960, MRN: [id number]   HPI  Chief Complaint  Patient presents with   Follow-up    Follow up. Patient has no complaints.    Reason for Visit: Follow-up  [name] is a 62 year old female never smoker with history of multifocal stage Ia breast cancer status post lumpectomy 07/2019 and radiation on adjuvant therapy, peripheral neuropathy, depression who presents for follow-up  Initial Consult 07/10/21 She is referred by her PCP. Note reviewed from 06/14/21. She reports shortness of breath after completing chemotherapy in August/September. Associated with coughing. Occasional wheezing. No chest congestion. Occurs with any exertion including with walking, bathing and talking. Bending down is difficult for her due to dizziness. She had a negative CXR in the fall and normal SpO2. She has tried on Trelegy for one week but unable to take due to feeling choked up. She was switched to [facility] and has been compliant for the last month. She reports partial improvement. Before her cancer treatment two years ago she is extremely active and functional.  08/22/21 She presented for PFT evaluation. Since our last visit she has been taking Breztri with partial improvement ~30%. Her shortness of breath and coughing has improved. Occasional wheezing. She is walking two laps around the yard. Husband is present and reports she is more active.  11/27/21 Husband present and provides additional history with patient. She is compliant with Breztri twice daily. She takes albuterol once a day. She continues to shortness of breath. Occasional deep wheeze and cough with exertion. Not currently in Pulmonary Rehab due to frequent doctor visits and fatigue. She was recently hospitalized [facility] (5/4-10/24/21) for colitis requiring antibiotics (amoxicillin). Denies needing vancomycin. She walking around with the walker at the house daily  but has low energy at times.   01/12/22 Husband present visit and provides additional history.  Since our last visit she has had shortness of breath. Trying to be more active walking 4000-5000 steps a day. Has some wheezing and coughing. Compliant with Breztri. She will use her albuterol 3-4 times a day, usually when she isn't wearing oxygen. She reports on room air that her pulse oximetry will read as low as 91%.  She was previously referred to pulmonary rehab but states that she is unable to make it to classes 3 days a week due to transportation issues.  03/20/22 Since our last visit she has been compliant on Breztri. She has started on Pulmonary Rehab and wears 2L. She is starting to walk more. Still feels short of breath when rushed. Still has some wheezing and coughing. Uses albuterol 2-3 times a day with activity. Has some symptoms at night. No exacerbations since our last visit  07/27/22 Since our last visit she has participated in Pulmonary rehab but has had medical hold for January due to her husbands cancer. She is ready to get to re-enroll. Does feel like they will benefit. Does have chronic cough and some wheezing with deep breaths but shortness of breath has improved.  11/02/22 Since our last visit she has completed Pulmonary rehab. She walks 1 mile for days out of the week. She does weights and bands at home. Has shortness of breath with activity. Continues to have chronic cough with upper throat congestion. 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     anastrozole (ARIMIDEX) 1 MG tablet TAKE 1 TABLET BY MOUTH EVERY DAY TO PREVENT FUTURE DISEASE RECURRENCE 90 tablet 3   aspirin 325 MG EC tablet Take 325 mg by mouth daily.     atenolol (TENORMIN) 50 MG tablet Take 50 mg by mouth daily.     Blood Glucose Monitoring Suppl (ACCU-CHEK GUIDE ME) w/Device KIT 1 (ONE) KIT CHECK BLOOD GLUCOSE DAILY     BREZTRI AEROSPHERE 160-9-4.8 MCG/ACT AERO Inhale 2 puffs into the lungs in the morning and at bedtime. 10.7 g 5   cyclobenzaprine (FLEXERIL) 10 MG tablet Take 1 tablet by mouth 2 (two) times daily as needed.     diclofenac Sodium (VOLTAREN) 1 % GEL Apply topically.     Evolocumab (REPATHA SURECLICK) 140 MG/ML SOAJ INJECT 1 ML INTO THE SKIN EVERY 14 (FOURTEEN) DAYS. 6 mL 3   ezetimibe (ZETIA) 10 MG tablet Take 10 mg by mouth at bedtime.     FARXIGA 10 MG TABS tablet Take 10 mg by mouth every morning.     glucose blood (ACCU-CHEK GUIDE) test strip 1 (ONE) EACH DAILY AND AS NEEDED FOR SYMPTOMS     ibuprofen (ADVIL) 800 MG tablet Take 800 mg by mouth every 8 (eight) hours as needed.     lactulose, encephalopathy, (CHRONULAC) 10 GM/15ML SOLN Take 15 mLs (10 g total) by mouth daily. 473 mL 2   lisinopril (ZESTRIL) 20 MG tablet Take 20 mg by mouth 2 (two) times daily.     magic mouthwash SOLN Take 5 mLs by mouth.     metFORMIN (GLUCOPHAGE-XR) 500 MG 24 hr tablet Take 500 mg by mouth daily.     naloxone (NARCAN) nasal spray 4 mg/0.1 mL Use as directed for overdose of narcotic medication 1 each 2   NUCYNTA 50 MG tablet Take 50 mg by mouth 4 (four) times daily.     ondansetron (ZOFRAN)  4 MG tablet Take 4 mg by mouth every 8 (eight) hours as needed for nausea or vomiting.     prochlorperazine (COMPAZINE) 10 MG tablet Take 1 tablet (10 mg total) by mouth every 6 (six) hours as needed for nausea or vomiting. 30 tablet 1   rosuvastatin (CRESTOR) 40 MG tablet Take 40 mg by mouth daily.     sertraline (ZOLOFT) 50 MG tablet Take 1 tablet by mouth daily.     tirzepatide [facility]) 5 MG/0.5ML Pen Inject 5 mg into the skin once a week. 2 mL 2   VENTOLIN HFA 108 (90 Base) MCG/ACT inhaler TAKE 2 PUFFS BY MOUTH EVERY 6 HOURS AS NEEDED FOR WHEEZE OR SHORTNESS OF BREATH 18 each 2   BD PEN NEEDLE NANO 2ND GEN 32G X 4 MM MISC 1 (ONE) PEN NEEDLE USE DAILY  Oxycodone HCl 10 MG TABS Take 0.5 tablets (5 mg total) by mouth every 4 (four) hours as needed. 120 tablet 0   pregabalin (LYRICA) 75 MG capsule TAKE 1 CAPSULE BY MOUTH TWICE A DAY 180 capsule 0   senna (SENOKOT) 8.6 MG tablet Take 1 tablet by mouth daily. Take 1-2 tablets (Patient not taking: Reported on 01/24/2022)     No facility-administered medications prior to visit.    Review of Systems  Constitutional:  Negative for chills, diaphoresis, fever, malaise/fatigue and weight loss.  HENT:  Positive for congestion.   Respiratory:  Positive for cough and shortness of breath. Negative for hemoptysis, sputum production and wheezing.   Cardiovascular:  Negative for chest pain, palpitations and leg swelling.     Objective:   Vitals:   11/02/22 1010  BP: 118/78  Pulse: 81  Temp: 98.9 F (37.2 C)  TempSrc: Oral  SpO2: 99%  Weight: 162 lb (73.5 kg)  Height: 5\' 4"  (1.626 m)    SpO2: 99 % O2 Device: None (Room air)  Physical Exam: General: Well-appearing, no acute distress HENT: [place], AT Eyes: EOMI, no scleral icterus Respiratory: Clear to auscultation bilaterally.  No crackles, wheezing or rales Cardiovascular: RRR, -M/R/G, no JVD Extremities:-Edema,-tenderness Neuro: AAO x4, CNII-XII grossly intact Psych: Normal mood, normal  affect  Data Reviewed:  Imaging: CXR 07/10/2021- No infiltrate, effusion or edema. Low lung volumes. CT chest 12/18/2021-overall normal lung parenchyma  PFT: 08/22/21  FVC 1.05 (39%) FEV1 0.67 (31%) Ratio 67   Unable to complete TLC or DLCO due to effort Interpretation: Very severe obstructive defect. No significant bronchodilator response  Labs: BMET from 12/30/2020 reviewed.  No abnormalities.  Normal electrolytes and renal function   Assessment & Plan:   Discussion: 62 year old female with hx of multifocal stage Ia breast cancer s/p lumpectomy 07/2021 and radiation on adjuvant therapy, peripheral neuropathy, depression who presents for follow-up. Improved activity. Ambulatory O2 with no desaturations.  Dyspnea on exertion - Improving. multifactorial with deconditioning, severe COPD and hx chemotherapy/radiation Severe COPD (non-smoker) Hx of chemotherapy/radiation --CONTINUE Breztri TWO puffs TWICE a day --CONTINUE Albuterol TWO puffs AS NEEDED for shortness of breath or wheezing. Use before exercise --Completed pulm rehab 10/11/22 --Continue regular aerobic exercise at home --Consider gabapentin if cough persistent in the future after trial off oxygen  Chronic hypoxemic respiratory failure --ORDER ONO on room air. If normal, ok to discontinue oxygen --CONTINUE supplemental oxygen as needed for goal SpO2 >88%  Health Maintenance  There is no immunization history on file for this patient. CT Lung Screen - never smoker. Not qualified.  Orders Placed This Encounter  Procedures   Pulse oximetry, overnight    Standing Status:   Future    Standing Expiration Date:   11/02/2023    Scheduling Instructions:     On room air   Meds ordered this encounter  Medications   albuterol (VENTOLIN HFA) 108 (90 Base) MCG/ACT inhaler    Sig: Inhale 1-2 puffs into the lungs every 4 (four) hours as needed for wheezing or shortness of breath.    Dispense:  8 g    Refill:  3    Return in  about 4 months (around 03/05/2023).  I have spent a total time of 35-minutes on the day of the appointment including chart review, data review, collecting history, coordinating care and discussing medical diagnosis and plan with the patient/family. Past medical history, allergies, medications were reviewed. Pertinent imaging, labs and tests included in this note have  been reviewed and interpreted independently by me.  Caylee Vlachos Mechele Collin, MD [facility] 11/02/2022  Office Number [phone]

## 2022-11-02 NOTE — Patient Instructions (Signed)
Dyspnea on exertion - Improving. multifactorial with deconditioning, severe COPD and hx chemotherapy/radiation Severe COPD (non-smoker) Hx of chemotherapy/radiation --CONTINUE Breztri TWO puffs TWICE a day --CONTINUE Albuterol TWO puffs AS NEEDED for shortness of breath or wheezing. Use before exercise --Completed pulm rehab 10/11/22 --Continue regular aerobic exercise at home --Consider gabapentin if cough persistent in the future after trial off oxygen  Chronic hypoxemic respiratory failure --ORDER ONO on room air. If normal, ok to discontinue oxygen --CONTINUE supplemental oxygen as needed for goal SpO2 >88%

## 2022-11-05 ENCOUNTER — Inpatient Hospital Stay: Payer: PPO

## 2022-11-05 ENCOUNTER — Inpatient Hospital Stay: Payer: PPO | Attending: Oncology

## 2022-11-05 DIAGNOSIS — Z170 Estrogen receptor positive status [ER+]: Secondary | ICD-10-CM | POA: Diagnosis not present

## 2022-11-05 DIAGNOSIS — M810 Age-related osteoporosis without current pathological fracture: Secondary | ICD-10-CM | POA: Insufficient documentation

## 2022-11-05 DIAGNOSIS — C50919 Malignant neoplasm of unspecified site of unspecified female breast: Secondary | ICD-10-CM | POA: Diagnosis not present

## 2022-11-05 LAB — CMP (CANCER CENTER ONLY)
ALT: 49 U/L — ABNORMAL HIGH (ref 0–44)
AST: 42 U/L — ABNORMAL HIGH (ref 15–41)
Albumin: 3.9 g/dL (ref 3.5–5.0)
Alkaline Phosphatase: 32 U/L — ABNORMAL LOW (ref 38–126)
Anion gap: 9 (ref 5–15)
BUN: 15 mg/dL (ref 6–20)
CO2: 23 mmol/L (ref 22–32)
Calcium: 9.1 mg/dL (ref 8.9–10.3)
Chloride: 107 mmol/L (ref 98–111)
Creatinine: 0.79 mg/dL (ref 0.44–1.00)
GFR, Estimated: >60 mL/min (ref >60)
Glucose, Bld: 111 mg/dL — ABNORMAL HIGH (ref 70–99)
Potassium: 4 mmol/L (ref 3.5–5.1)
Sodium: 139 mmol/L (ref 135–145)
Total Bilirubin: 0.6 mg/dL (ref 0.3–1.2)
Total Protein: 7 g/dL (ref 6.5–8.1)

## 2022-11-05 LAB — CBC WITH DIFFERENTIAL (CANCER CENTER ONLY)
Abs Immature Granulocytes: 0.02 10*3/uL (ref 0.00–0.07)
Basophils Absolute: 0 10*3/uL (ref 0.0–0.1)
Basophils Relative: 1 %
Eosinophils Absolute: 0 10*3/uL (ref 0.0–0.5)
Eosinophils Relative: 1 %
HCT: 42.4 % (ref 36.0–46.0)
Hemoglobin: 13.4 g/dL (ref 12.0–15.0)
Immature Granulocytes: 1 %
Lymphocytes Relative: 26 %
Lymphs Abs: 1 10*3/uL (ref 0.7–4.0)
MCH: 27.5 pg (ref 26.0–34.0)
MCHC: 31.6 g/dL (ref 30.0–36.0)
MCV: 86.9 fL (ref 80.0–100.0)
Monocytes Absolute: 0.4 10*3/uL (ref 0.1–1.0)
Monocytes Relative: 11 %
Neutro Abs: 2.3 10*3/uL (ref 1.7–7.7)
Neutrophils Relative %: 60 %
Platelet Count: 195 10*3/uL (ref 150–400)
RBC: 4.88 MIL/uL (ref 3.87–5.11)
RDW: 14 % (ref 11.5–15.5)
WBC Count: 3.8 10*3/uL — ABNORMAL LOW (ref 4.0–10.5)
nRBC: 0 % (ref 0.0–0.2)

## 2022-11-06 ENCOUNTER — Other Ambulatory Visit: Payer: Self-pay | Admitting: Hematology and Oncology

## 2022-11-06 DIAGNOSIS — C50012 Malignant neoplasm of nipple and areola, left female breast: Secondary | ICD-10-CM

## 2022-11-07 ENCOUNTER — Inpatient Hospital Stay: Payer: PPO

## 2022-11-07 VITALS — BP 140/98 | HR 84 | Resp 14 | Ht 64.0 in | Wt 157.0 lb

## 2022-11-07 DIAGNOSIS — C50012 Malignant neoplasm of nipple and areola, left female breast: Secondary | ICD-10-CM

## 2022-11-07 DIAGNOSIS — M810 Age-related osteoporosis without current pathological fracture: Secondary | ICD-10-CM | POA: Diagnosis not present

## 2022-11-07 MED ORDER — DENOSUMAB 60 MG/ML ~~LOC~~ SOSY
60.0000 mg | PREFILLED_SYRINGE | Freq: Once | SUBCUTANEOUS | Status: AC
  Administered 2022-11-07: 60 mg via SUBCUTANEOUS
  Filled 2022-11-07: qty 1

## 2022-11-07 MED ORDER — SODIUM CHLORIDE 0.9 % IV SOLN: Freq: Once | INTRAVENOUS | Status: DC

## 2022-12-23 ENCOUNTER — Telehealth (HOSPITAL_BASED_OUTPATIENT_CLINIC_OR_DEPARTMENT_OTHER): Payer: Self-pay | Admitting: Pulmonary Disease

## 2022-12-23 DIAGNOSIS — J9611 Chronic respiratory failure with hypoxia: Secondary | ICD-10-CM

## 2022-12-23 NOTE — Telephone Encounter (Signed)
Overnight Oximetry Results Date: 11/21/22 SpO2 <88% 0 hour 1 min 18 sec.  [place] SpO2 86%  Not qualified for oxygen  Assessment/Plan Nocturnal Hypoxemia/Chronic hypoxemic respiratory failure - RESOLVED --Discontinue oxygen

## 2022-12-25 NOTE — Telephone Encounter (Signed)
[facility] has requested for a prescription for oxygen to be p[picked up

## 2022-12-25 NOTE — Telephone Encounter (Signed)
PT husband called. I read results and he expressed understanding. Will not close tel encounter until req from Dr. Everardo All is followed thru with and noted.

## 2022-12-26 NOTE — Telephone Encounter (Signed)
Orders have been put in.   Nothing further needed.

## 2023-01-09 ENCOUNTER — Encounter: Payer: Self-pay | Admitting: Pulmonary Disease

## 2023-01-16 ENCOUNTER — Encounter: Payer: Self-pay | Admitting: Oncology

## 2023-01-16 NOTE — Telephone Encounter (Signed)
Created in error

## 2023-01-24 ENCOUNTER — Other Ambulatory Visit (HOSPITAL_COMMUNITY): Payer: Self-pay

## 2023-01-24 ENCOUNTER — Telehealth: Payer: Self-pay | Admitting: Pharmacy Technician

## 2023-01-24 NOTE — Telephone Encounter (Signed)
Pharmacy Patient Advocate Encounter   Received notification from CoverMyMeds that prior authorization for Repathaa sureclick 140mg /ml is required/requested.   Insurance verification completed.   The patient is insured through [employer]/ [employer] .   Per test claim: Refill too soon. PA is not needed at this time. Medication was filled 01/14/2023. Next eligible fill date is 03/18/2023.

## 2023-02-05 ENCOUNTER — Other Ambulatory Visit (HOSPITAL_BASED_OUTPATIENT_CLINIC_OR_DEPARTMENT_OTHER): Payer: Self-pay | Admitting: Pulmonary Disease

## 2023-04-02 NOTE — Progress Notes (Unsigned)
[facility]  [address] [place],  [place]  [address] [phone]  Clinic Day:  04/03/2023  Referring physician: Lezlie Lye, Irma M, *   HISTORY OF PRESENT ILLNESS:  The patient is a 62 y.o. 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  She was placed on weekly paclitaxel/Herceptin for her adjuvant therapy.  However, due to significant peripheral neuropathy, her paclitaxel was discontinued a few weeks before the entire 12 weekly treatments were completed.  She completed 1 year of HER2 therapy in May 2022.  She also completed adjuvant breast radiation.  She takes anastrozole on a daily basis for her adjuvant endocrine therapy.  She comes in today for routine followup.  Since her last visit, the patient has been doing better.  She believes her lung health is much better after going through pulmonary rehab.  She is no longer on oxygen.  Her neuropathy remains prominent in her hands and feet.  She is currently being seen by pain management for her neuropathic pain.   From a breast cancer perspective, she denies having any particular changes with her breasts which concern her for disease recurrence.    PHYSICAL EXAM:  Blood pressure 127/67, pulse 82, temperature 98.3 F (36.8 C), resp. rate 14, height 5\' 4"  (1.626 m), weight 138 lb (62.6 kg), last menstrual period 08/12/2020, SpO2 99%. Wt Readings from Last 3 Encounters:  04/03/23 138 lb (62.6 kg)  11/07/22 157 lb 0.6 oz (71.2 kg)  11/02/22 162 lb (73.5 kg)   Body mass index is 23.69 kg/m. Performance status (ECOG): 2 - Symptomatic, <50% confined to bed Physical Exam Constitutional:      Appearance: Normal appearance. She is not ill-appearing.     Comments: She is no longer ambulating with a cane or wearing oxygen per nasal canula   HENT:     Mouth/Throat:     Mouth: Mucous membranes are moist.     Pharynx:  Oropharynx is clear. No oropharyngeal exudate or posterior oropharyngeal erythema.  Cardiovascular:     Rate and Rhythm: Normal rate and regular rhythm.     Heart sounds: No murmur heard.    No friction rub. No gallop.  Pulmonary:     Effort: Pulmonary effort is normal. No respiratory distress.     Breath sounds: Decreased air movement present. Decreased breath sounds present. No wheezing, rhonchi or rales.  Chest:  Breasts:    Right: No swelling, bleeding, inverted nipple, mass, nipple discharge or skin change.     Left: No swelling, bleeding, inverted nipple, mass, nipple discharge or skin change.  Abdominal:     General: Bowel sounds are normal. There is no distension.     Palpations: Abdomen is soft. There is no mass.     Tenderness: There is no abdominal tenderness.  Musculoskeletal:        General: No swelling or tenderness.     Cervical back: Normal range of motion and neck supple.     Right lower leg: No edema.     Left lower leg: No edema.  Lymphadenopathy:     Cervical: No cervical adenopathy.     Right cervical: No superficial, deep or posterior cervical adenopathy.    Left cervical: No superficial, deep or posterior cervical adenopathy.     Upper Body:     Right upper body: No supraclavicular or axillary adenopathy.     Left upper  body: No supraclavicular or axillary adenopathy.     Lower Body: No right inguinal adenopathy. No left inguinal adenopathy.  Skin:    General: Skin is warm.     Coloration: Skin is not jaundiced.     Findings: No lesion or rash.  Neurological:     General: No focal deficit present.     Mental Status: She is alert and oriented to person, place, and time. Mental status is at baseline.  Psychiatric:        Mood and Affect: Mood normal.        Behavior: Behavior normal.        Thought Content: Thought content normal.        Judgment: Judgment normal.   LABS:      Latest Ref Rng & Units 11/05/2022    8:57 AM 07/05/2020   12:00 AM  03/18/2020   12:00 AM  CBC  WBC 4.0 - 10.5 K/uL 3.8  3.6     2.7      Hemoglobin 12.0 - 15.0 g/dL 14.7  82.9     56.2      Hematocrit 36.0 - 46.0 % 42.4  35     35      Platelets 150 - 400 K/uL 195  186     182         This result is from an external source.      Latest Ref Rng & Units 11/05/2022    8:57 AM 07/05/2020   12:00 AM 03/18/2020   12:00 AM  CMP  Glucose 70 - 99 mg/dL 130     BUN 6 - 20 mg/dL 15  26     16       Creatinine 0.44 - 1.00 mg/dL 8.65  1.5     1.0      Sodium 135 - 145 mmol/L 139  139     140      Potassium 3.5 - 5.1 mmol/L 4.0  4.2     4.1      Chloride 98 - 111 mmol/L 107  105     107      CO2 22 - 32 mmol/L 23  25       Calcium 8.9 - 10.3 mg/dL 9.1  9.7     9.6      Total Protein 6.5 - 8.1 g/dL 7.0     Total Bilirubin 0.3 - 1.2 mg/dL 0.6     Alkaline Phos 38 - 126 U/L 32  57     49      AST 15 - 41 U/L 42  37     31      ALT 0 - 44 U/L 49  27     26         This result is from an external source.   ASSESSMENT & PLAN:  Assessment/Plan:  A 62 y.o. 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  Based upon her clinical breast exam today, the patient remains disease free.  She knows to continue taking her anastrozole daily for her 5 total years of adjuvant endocrine therapy.  With respect to her painful neuropathy, she knows to stay on the regimen that has been formulated per her pain management clinic.  As she has osteoporosis, she will continue to receive Prolia every 6 months.  Furthermore, she knows to take her daily allotment  of calcium and vitamin D to maintain satisfactory bone health.  Otherwise, I will see her back in 6 months for her next clinical breast exam.  Her annual mammogram will be scheduled before her next visit for her continued radiographic breast cancer surveillance.  The patient understands all the plans discussed today and is in agreement with  them.   Denell Cothern Kirby Funk, MD

## 2023-04-03 ENCOUNTER — Other Ambulatory Visit: Payer: Self-pay | Admitting: Oncology

## 2023-04-03 ENCOUNTER — Inpatient Hospital Stay: Payer: PPO | Attending: Oncology | Admitting: Oncology

## 2023-04-03 VITALS — BP 127/67 | HR 82 | Temp 98.3°F | Resp 14 | Ht 64.0 in | Wt 138.0 lb

## 2023-04-03 DIAGNOSIS — C50012 Malignant neoplasm of nipple and areola, left female breast: Secondary | ICD-10-CM

## 2023-04-04 ENCOUNTER — Other Ambulatory Visit: Payer: Self-pay | Admitting: Oncology

## 2023-04-04 MED ORDER — ONDANSETRON HCL 4 MG PO TABS: 4.0000 mg | ORAL_TABLET | Freq: Three times a day (TID) | ORAL | 1 refills | Status: AC | PRN

## 2023-04-05 ENCOUNTER — Telehealth: Payer: Self-pay | Admitting: Oncology

## 2023-04-05 NOTE — Telephone Encounter (Signed)
Patient has been scheduled. Aware of appt date and time.   Scheduling Message Entered by Rennis Harding A on 04/03/2023 at 12:31 PM Priority: Routine <No visit type provided>  Department: [facility]  Provider:  Scheduling Notes:  Mammo 08-19-23  Appt 10-02-23

## 2023-05-09 ENCOUNTER — Other Ambulatory Visit: Payer: PPO

## 2023-05-09 ENCOUNTER — Encounter: Payer: Self-pay | Admitting: Oncology

## 2023-05-09 ENCOUNTER — Inpatient Hospital Stay: Payer: PPO | Attending: Oncology

## 2023-05-09 VITALS — BP 112/82 | HR 85 | Temp 98.5°F | Resp 18 | Ht 64.0 in | Wt 139.0 lb

## 2023-05-09 MED ORDER — DENOSUMAB 60 MG/ML ~~LOC~~ SOSY: 60.0000 mg | PREFILLED_SYRINGE | Freq: Once | SUBCUTANEOUS | Status: DC

## 2023-05-09 NOTE — Progress Notes (Unsigned)
Pt had lab work drawn at [facility] this morning.  Results not in.  Unable to give prolia without calcium results.  [name] called and they state a cmp was drawn and they should have results by tomorrow afternoon.  Explained to situation to pt. Pt also not taking calcium with vit d3.  Discussed importance. Drug list and coupon provided to our community pharmacy.   Pt and husband verbalize understanding.  Pt d/c home.

## 2023-05-09 NOTE — Patient Instructions (Signed)
Denosumab Injection (Osteoporosis) What is this medication? DENOSUMAB (den oh SUE mab) prevents and treats osteoporosis. It works by making your bones stronger and less likely to break (fracture). It is a monoclonal antibody. This medicine may be used for other purposes; ask your health care provider or pharmacist if you have questions. COMMON BRAND NAME(S): Prolia What should I tell my care team before I take this medication? They need to know if you have any of these conditions: Dental or gum disease Had thyroid or parathyroid (glands located in neck) surgery Having dental surgery or a tooth pulled Kidney disease Low levels of calcium in the blood On dialysis Poor nutrition Thyroid disease Trouble absorbing nutrients from your food An unusual or allergic reaction to denosumab, other medications, foods, dyes, or preservatives Pregnant or trying to get pregnant Breastfeeding How should I use this medication? This medication is injected under the skin. It is given by your care team in a hospital or clinic setting. A special MedGuide will be given to you before each treatment. Be sure to read this information carefully each time. Talk to your care team about the use of this medication in children. Special care may be needed. Overdosage: If you think you have taken too much of this medicine contact a poison control center or emergency room at once. NOTE: This medicine is only for you. Do not share this medicine with others. What if I miss a dose? Keep appointments for follow-up doses. It is important not to miss your dose. Call your care team if you are unable to keep an appointment. What may interact with this medication? Do not take this medication with any of the following: Other medications that contain denosumab This medication may also interact with the following: Medications that lower your chance of fighting infection Steroid medications, such as prednisone or cortisone This  list may not describe all possible interactions. Give your health care provider a list of all the medicines, herbs, non-prescription drugs, or dietary supplements you use. Also tell them if you smoke, drink alcohol, or use illegal drugs. Some items may interact with your medicine. What should I watch for while using this medication? Your condition will be monitored carefully while you are receiving this medication. You may need blood work done while taking this medication. This medication may increase your risk of getting an infection. Call your care team for advice if you get a fever, chills, sore throat, or other symptoms of a cold or flu. Do not treat yourself. Try to avoid being around people who are sick. Tell your dentist and dental surgeon that you are taking this medication. You should not have major dental surgery while on this medication. See your dentist to have a dental exam and fix any dental problems before starting this medication. Take good care of your teeth while on this medication. Make sure you see your dentist for regular follow-up appointments. This medication may cause low levels of calcium in your body. The risk of severe side effects is increased in people with kidney disease. Your care team may prescribe calcium and vitamin D to help prevent low calcium levels while you take this medication. It is important to take calcium and vitamin D as directed by your care team. Talk to your care team if you may be pregnant. Serious birth defects may occur if you take this medication during pregnancy and for 5 months after the last dose. You will need a negative pregnancy test before starting this medication. Contraception   is recommended while taking this medication and for 5 months after the last dose. Your care team can help you find the option that works for you. Talk to your care team before breastfeeding. Changes to your treatment plan may be needed. What side effects may I notice from  receiving this medication? Side effects that you should report to your care team as soon as possible: Allergic reactions--skin rash, itching, hives, swelling of the face, lips, tongue, or throat Infection--fever, chills, cough, sore throat, wounds that don't heal, pain or trouble when passing urine, general feeling of discomfort or being unwell Low calcium level--muscle pain or cramps, confusion, tingling, or numbness in the hands or feet Osteonecrosis of the jaw--pain, swelling, or redness in the mouth, numbness of the jaw, poor healing after dental work, unusual discharge from the mouth, visible bones in the mouth Severe bone, joint, or muscle pain Skin infection--skin redness, swelling, warmth, or pain Side effects that usually do not require medical attention (report these to your care team if they continue or are bothersome): Back pain Headache Joint pain Muscle pain Pain in the hands, arms, legs, or feet Runny or stuffy nose Sore throat This list may not describe all possible side effects. Call your doctor for medical advice about side effects. You may report side effects to FDA at 1-800-FDA-1088. Where should I keep my medication? This medication is given in a hospital or clinic. It will not be stored at home. NOTE: This sheet is a summary. It may not cover all possible information. If you have questions about this medicine, talk to your doctor, pharmacist, or health care provider.  2024 Elsevier/Gold Standard (2022-07-10 00:00:00)  

## 2023-05-10 LAB — LAB REPORT - SCANNED: EGFR: 68

## 2023-05-12 ENCOUNTER — Other Ambulatory Visit (HOSPITAL_BASED_OUTPATIENT_CLINIC_OR_DEPARTMENT_OTHER): Payer: Self-pay | Admitting: Pulmonary Disease

## 2023-05-13 ENCOUNTER — Encounter: Payer: Self-pay | Admitting: Oncology

## 2023-05-13 ENCOUNTER — Inpatient Hospital Stay: Payer: PPO | Attending: Oncology

## 2023-05-13 VITALS — BP 111/54 | HR 98 | Temp 98.9°F | Resp 20

## 2023-05-13 DIAGNOSIS — M810 Age-related osteoporosis without current pathological fracture: Secondary | ICD-10-CM | POA: Diagnosis present

## 2023-05-13 DIAGNOSIS — C50912 Malignant neoplasm of unspecified site of left female breast: Secondary | ICD-10-CM | POA: Insufficient documentation

## 2023-05-13 DIAGNOSIS — Z79811 Long term (current) use of aromatase inhibitors: Secondary | ICD-10-CM | POA: Diagnosis not present

## 2023-05-13 DIAGNOSIS — Z170 Estrogen receptor positive status [ER+]: Secondary | ICD-10-CM | POA: Insufficient documentation

## 2023-05-13 DIAGNOSIS — C50012 Malignant neoplasm of nipple and areola, left female breast: Secondary | ICD-10-CM

## 2023-05-13 MED ORDER — DENOSUMAB 60 MG/ML ~~LOC~~ SOSY
60.0000 mg | PREFILLED_SYRINGE | Freq: Once | SUBCUTANEOUS | Status: AC
Start: 2023-05-13 — End: 2023-05-13
  Administered 2023-05-13: 60 mg via SUBCUTANEOUS
  Filled 2023-05-13: qty 1

## 2023-05-13 MED ORDER — SODIUM CHLORIDE 0.9 % IV SOLN: Freq: Once | INTRAVENOUS | Status: DC

## 2023-05-13 NOTE — Patient Instructions (Signed)
Denosumab Injection (Osteoporosis) What is this medication? DENOSUMAB (den oh SUE mab) prevents and treats osteoporosis. It works by making your bones stronger and less likely to break (fracture). It is a monoclonal antibody. This medicine may be used for other purposes; ask your health care provider or pharmacist if you have questions. COMMON BRAND NAME(S): Prolia What should I tell my care team before I take this medication? They need to know if you have any of these conditions: Dental or gum disease Had thyroid or parathyroid (glands located in neck) surgery Having dental surgery or a tooth pulled Kidney disease Low levels of calcium in the blood On dialysis Poor nutrition Thyroid disease Trouble absorbing nutrients from your food An unusual or allergic reaction to denosumab, other medications, foods, dyes, or preservatives Pregnant or trying to get pregnant Breastfeeding How should I use this medication? This medication is injected under the skin. It is given by your care team in a hospital or clinic setting. A special MedGuide will be given to you before each treatment. Be sure to read this information carefully each time. Talk to your care team about the use of this medication in children. Special care may be needed. Overdosage: If you think you have taken too much of this medicine contact a poison control center or emergency room at once. NOTE: This medicine is only for you. Do not share this medicine with others. What if I miss a dose? Keep appointments for follow-up doses. It is important not to miss your dose. Call your care team if you are unable to keep an appointment. What may interact with this medication? Do not take this medication with any of the following: Other medications that contain denosumab This medication may also interact with the following: Medications that lower your chance of fighting infection Steroid medications, such as prednisone or cortisone This  list may not describe all possible interactions. Give your health care provider a list of all the medicines, herbs, non-prescription drugs, or dietary supplements you use. Also tell them if you smoke, drink alcohol, or use illegal drugs. Some items may interact with your medicine. What should I watch for while using this medication? Your condition will be monitored carefully while you are receiving this medication. You may need blood work done while taking this medication. This medication may increase your risk of getting an infection. Call your care team for advice if you get a fever, chills, sore throat, or other symptoms of a cold or flu. Do not treat yourself. Try to avoid being around people who are sick. Tell your dentist and dental surgeon that you are taking this medication. You should not have major dental surgery while on this medication. See your dentist to have a dental exam and fix any dental problems before starting this medication. Take good care of your teeth while on this medication. Make sure you see your dentist for regular follow-up appointments. This medication may cause low levels of calcium in your body. The risk of severe side effects is increased in people with kidney disease. Your care team may prescribe calcium and vitamin D to help prevent low calcium levels while you take this medication. It is important to take calcium and vitamin D as directed by your care team. Talk to your care team if you may be pregnant. Serious birth defects may occur if you take this medication during pregnancy and for 5 months after the last dose. You will need a negative pregnancy test before starting this medication. Contraception   is recommended while taking this medication and for 5 months after the last dose. Your care team can help you find the option that works for you. Talk to your care team before breastfeeding. Changes to your treatment plan may be needed. What side effects may I notice from  receiving this medication? Side effects that you should report to your care team as soon as possible: Allergic reactions--skin rash, itching, hives, swelling of the face, lips, tongue, or throat Infection--fever, chills, cough, sore throat, wounds that don't heal, pain or trouble when passing urine, general feeling of discomfort or being unwell Low calcium level--muscle pain or cramps, confusion, tingling, or numbness in the hands or feet Osteonecrosis of the jaw--pain, swelling, or redness in the mouth, numbness of the jaw, poor healing after dental work, unusual discharge from the mouth, visible bones in the mouth Severe bone, joint, or muscle pain Skin infection--skin redness, swelling, warmth, or pain Side effects that usually do not require medical attention (report these to your care team if they continue or are bothersome): Back pain Headache Joint pain Muscle pain Pain in the hands, arms, legs, or feet Runny or stuffy nose Sore throat This list may not describe all possible side effects. Call your doctor for medical advice about side effects. You may report side effects to FDA at 1-800-FDA-1088. Where should I keep my medication? This medication is given in a hospital or clinic. It will not be stored at home. NOTE: This sheet is a summary. It may not cover all possible information. If you have questions about this medicine, talk to your doctor, pharmacist, or health care provider.  2024 Elsevier/Gold Standard (2022-07-10 00:00:00)  

## 2023-05-13 NOTE — Progress Notes (Signed)
Cmp drawn on 05/09/23 received via fax from [facility].  Reviewed with Dorothy Spark, RPH-calcium 9.2 and creatinine 0.95. Ok to proceed with treatment.

## 2023-06-10 ENCOUNTER — Ambulatory Visit (HOSPITAL_BASED_OUTPATIENT_CLINIC_OR_DEPARTMENT_OTHER): Payer: PPO | Admitting: Pulmonary Disease

## 2023-07-03 ENCOUNTER — Encounter: Payer: Self-pay | Admitting: Oncology

## 2023-07-03 ENCOUNTER — Telehealth: Payer: Self-pay | Admitting: Cardiovascular Disease

## 2023-07-03 ENCOUNTER — Other Ambulatory Visit (HOSPITAL_BASED_OUTPATIENT_CLINIC_OR_DEPARTMENT_OTHER): Payer: Self-pay

## 2023-07-03 NOTE — Telephone Encounter (Signed)
 Pt c/o medication issue:  1. Name of Medication:   Evolocumab  (REPATHA  SURECLICK) 140 MG/ML SOAJ    2. How are you currently taking this medication (dosage and times per day)? INJECT 1 ML INTO THE SKIN EVERY 14 (FOURTEEN) DAYS.   3. Are you having a reaction (difficulty breathing--STAT)? No  4. What is your medication issue? Healthteam advantage  Case id " [id number] needing PA per spouse

## 2023-07-04 ENCOUNTER — Telehealth: Payer: Self-pay | Admitting: Pharmacy Technician

## 2023-07-04 NOTE — Telephone Encounter (Signed)
 Pharmacy Patient Advocate Encounter   Received notification from Pt Calls Messages that prior authorization for repatha is required/requested.   Insurance verification completed.   The patient is insured through [facility] ADVANTAGE/RX ADVANCE .   Per test claim: PA required; PA submitted to above mentioned insurance via Fax Key/confirmation #/EOC faxed Status is pending

## 2023-07-08 ENCOUNTER — Other Ambulatory Visit (HOSPITAL_COMMUNITY): Payer: Self-pay

## 2023-07-08 NOTE — Telephone Encounter (Signed)
Pharmacy Patient Advocate Encounter  Received notification from [facility] ADVANTAGE/RX ADVANCE that Prior Authorization for repatha has been APPROVED from 07/05/23 to 07/04/24   PA #/Case ID/Reference #: [id number]

## 2023-08-05 ENCOUNTER — Encounter (HOSPITAL_BASED_OUTPATIENT_CLINIC_OR_DEPARTMENT_OTHER): Payer: Self-pay | Admitting: Pulmonary Disease

## 2023-08-05 ENCOUNTER — Ambulatory Visit (HOSPITAL_BASED_OUTPATIENT_CLINIC_OR_DEPARTMENT_OTHER): Payer: PPO | Admitting: Pulmonary Disease

## 2023-08-05 VITALS — BP 108/70 | HR 83 | Ht 64.0 in | Wt 142.1 lb

## 2023-08-05 DIAGNOSIS — J449 Chronic obstructive pulmonary disease, unspecified: Secondary | ICD-10-CM

## 2023-08-05 MED ORDER — BREZTRI AEROSPHERE 160-9-4.8 MCG/ACT IN AERO: 2.0000 | INHALATION_SPRAY | Freq: Two times a day (BID) | RESPIRATORY_TRACT | 11 refills | Status: AC

## 2023-08-05 NOTE — Progress Notes (Signed)
Subjective:   PATIENT ID: Ruth White: female DOB: [birth year], MRN: [id number]   HPI  Chief Complaint  Patient presents with   Follow-up    COPD   Reason for Visit: Follow-up  Ruth White is a 63 year old female never smoker with history of multifocal stage Ia breast cancer status post lumpectomy 07/2019 and radiation on adjuvant therapy, peripheral neuropathy, depression who presents for follow-up  Initial Consult 07/10/21 She is referred by her PCP. Note reviewed from 06/14/21. She reports shortness of breath after completing chemotherapy in August/September. Associated with coughing. Occasional wheezing. No chest congestion. Occurs with any exertion including with walking, bathing and talking. Bending down is difficult for her due to dizziness. She had a negative CXR in the fall and normal SpO2. She has tried on Trelegy for one week but unable to take due to feeling choked up. She was switched to [facility] and has been compliant for the last month. She reports partial improvement. Before her cancer treatment two years ago she is extremely active and functional.  08/22/21 She presented for PFT evaluation. Since our last visit she has been taking Breztri with partial improvement ~30%. Her shortness of breath and coughing has improved. Occasional wheezing. She is walking two laps around the yard. Husband is present and reports she is more active.  11/27/21 Husband present and provides additional history with patient. She is compliant with Breztri twice daily. She takes albuterol once a day. She continues to shortness of breath. Occasional deep wheeze and cough with exertion. Not currently in Pulmonary Rehab due to frequent doctor visits and fatigue. She was recently hospitalized [facility] (5/4-10/24/21) for colitis requiring antibiotics (amoxicillin). Denies needing vancomycin. She walking around with the walker at the house daily but has low energy at times.    01/12/22 Husband present visit and provides additional history.  Since our last visit she has had shortness of breath. Trying to be more active walking 4000-5000 steps a day. Has some wheezing and coughing. Compliant with Breztri. She will use her albuterol 3-4 times a day, usually when she isn't wearing oxygen. She reports on room air that her pulse oximetry will read as low as 91%.  She was previously referred to pulmonary rehab but states that she is unable to make it to classes 3 days a week due to transportation issues.  03/20/22 Since our last visit she has been compliant on Breztri. She has started on Pulmonary Rehab and wears 2L. She is starting to walk more. Still feels short of breath when rushed. Still has some wheezing and coughing. Uses albuterol 2-3 times a day with activity. Has some symptoms at night. No exacerbations since our last visit  07/27/22 Since our last visit she has participated in Pulmonary rehab but has had medical hold for January due to her husbands cancer. She is ready to get to re-enroll. Does feel like they will benefit. Does have chronic cough and some wheezing with deep breaths but shortness of breath has improved.  11/02/22 Since our last visit she has completed Pulmonary rehab. She walks 1 mile for days out of the week. She does weights and bands at home. Has shortness of breath with activity. Continues to have chronic cough with upper throat congestion. Uses albuterol at least twice a day  08/05/23 Since our last visit she has seen her PCP she has seen for URI in November and that resolved. However did see PCP again for productive cough with yellow sputum, wheezing  and shortness of breath 10 days ago and received Augmentin. Has improved 60% and slowly clearing up. Compliant Breztri twice a day. Does not wear oxygen at night. Has been walking around the house. Recently completed physical therapy in the last four weeks.   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 8.5 each 3   amitriptyline (ELAVIL) 25 MG tablet Take by mouth.     anastrozole (ARIMIDEX) 1 MG tablet TAKE 1 TABLET BY MOUTH EVERY DAY TO PREVENT FUTURE DISEASE RECURRENCE 90 tablet 3   aspirin 325 MG EC tablet Take 325 mg by mouth daily.     atenolol (TENORMIN) 50 MG tablet Take 50 mg by mouth daily.     Blood Glucose Monitoring Suppl (ACCU-CHEK GUIDE ME) w/Device KIT 1 (ONE) KIT CHECK BLOOD GLUCOSE DAILY     cyclobenzaprine (FLEXERIL) 10 MG tablet Take 1 tablet by mouth 2 (two) times daily as needed.     diclofenac Sodium (VOLTAREN) 1 % GEL Apply topically.     Evolocumab (REPATHA SURECLICK) 140 MG/ML SOAJ INJECT 1 ML INTO THE SKIN EVERY 14 (FOURTEEN) DAYS. 6 mL 3   ezetimibe (ZETIA) 10 MG tablet Take 10 mg by mouth at bedtime.     FARXIGA 10 MG TABS tablet Take 10 mg by mouth every morning.     glucose blood (ACCU-CHEK GUIDE) test strip 1 (ONE) EACH DAILY AND AS NEEDED FOR SYMPTOMS     ibuprofen (ADVIL) 800 MG tablet Take 800 mg by mouth every 8 (eight) hours as needed.     lactulose, encephalopathy, (CHRONULAC) 10 GM/15ML SOLN  Take 15 mLs (10 g total) by mouth daily. 473 mL 2   lisinopril (ZESTRIL) 20 MG tablet Take 20 mg by mouth 2 (two) times daily.     magic mouthwash SOLN Take 5 mLs by mouth.     metFORMIN (GLUCOPHAGE-XR) 500 MG 24 hr tablet Take 500 mg by mouth daily.     naloxone (NARCAN) nasal spray 4 mg/0.1 mL Use as directed for overdose of narcotic medication 1 each 2   ondansetron (ZOFRAN) 4 MG tablet Take 1 tablet (4 mg total) by mouth every 8 (eight) hours as needed for nausea or vomiting. 30 tablet 1   prochlorperazine (COMPAZINE) 10 MG tablet Take 1 tablet (10 mg total) by mouth every 6 (six) hours as needed for nausea or vomiting. 30 tablet 1   rosuvastatin (CRESTOR) 40 MG tablet Take 40 mg by mouth daily.     sertraline (ZOLOFT) 50 MG tablet Take 1 tablet by mouth daily.  tirzepatide [facility]) 5 MG/0.5ML Pen Inject 5 mg into the skin once a week. 2 mL 2   traMADol (ULTRAM) 50 MG tablet Take 50 mg by mouth 4 (four) times daily.     BREZTRI AEROSPHERE 160-9-4.8 MCG/ACT AERO Inhale 2 puffs into the lungs in the morning and at bedtime. 10.7 g 5   NUCYNTA 50 MG tablet Take 50 mg by mouth 4 (four) times daily. (Patient not taking: Reported on 08/05/2023)     No facility-administered medications prior to visit.    Review of Systems  Constitutional:  Negative for chills, diaphoresis, fever, malaise/fatigue and weight loss.  HENT:  Negative for congestion.   Respiratory:  Positive for cough and shortness of breath. Negative for hemoptysis, sputum production and wheezing.   Cardiovascular:  Negative for chest pain, palpitations and leg swelling.     Objective:   Vitals:   08/05/23 0833  BP: 108/70  Pulse: 83  Weight: 142 lb 1.6 oz (64.5 kg)  Height: 5\' 4"  (1.626 m)       Physical Exam: General: Well-appearing, no acute distress HENT: [place], AT Eyes: EOMI, no scleral icterus Respiratory: Clear to auscultation bilaterally.  No crackles, wheezing or rales Cardiovascular: RRR, -M/R/G, no  JVD Extremities:-Edema,-tenderness Neuro: AAO x4, CNII-XII grossly intact Psych: Normal mood, normal affect   Data Reviewed:  Imaging: CXR 07/10/2021- No infiltrate, effusion or edema. Low lung volumes. CT chest 12/18/2021-overall normal lung parenchyma  PFT: 08/22/21  FVC 1.05 (39%) FEV1 0.67 (31%) Ratio 67   Unable to complete TLC or DLCO due to effort Interpretation: Very severe obstructive defect. No significant bronchodilator response  Labs: BMET from 12/30/2020 reviewed.  No abnormalities.  Normal electrolytes and renal function   Assessment & Plan:   Discussion: 63 year old female with hx of multifocal stage Ia breast cancer s/p lumpectomy 07/2021 and radiation on adjuvant therapy, peripheral neuropathy and depression who presents for follow-up. No longer on oxygen for over 6 months. Overall well controlled on Breztri with 1-2 mild episodes URIs managed by her PCP. Discussed clinical course and management of COPDa including bronchodilator regimen, preventive care and action plan for exacerbation.  Dyspnea on exertion - Overall well controlled and improved on Breztri. Multifactorial with deconditioning, severe COPD and hx chemotherapy/radiation. Recently worsened by URI but this is improving after abx. Severe COPD (non-smoker) Hx of chemotherapy/radiation --CONTINUE Breztri TWO puffs TWICE a day --CONTINUE Albuterol TWO puffs AS NEEDED for shortness of breath or wheezing. Use before exercise --Previously completed pulm rehab 10/11/22 --Continue regular aerobic exercise at home  Chronic hypoxemic respiratory failure - resolved --No oxygen needs since 11/2022  Health Maintenance  There is no immunization history on file for this patient. CT Lung Screen - never smoker. Not qualified.  No orders of the defined types were placed in this encounter.  Meds ordered this encounter  Medications   BREZTRI AEROSPHERE 160-9-4.8 MCG/ACT AERO    Sig: Inhale 2 puffs into the lungs in the  morning and at bedtime.    Dispense:  10.7 g    Refill:  11    Return in about 1 year (around 08/04/2024).  I have spent a total time of 32-minutes on the day of the appointment including chart review, data review, collecting history, coordinating care and discussing medical diagnosis and plan with the patient/family. Past medical history, allergies, medications were reviewed. Pertinent imaging, labs and tests included in this note have been reviewed and interpreted independently by me.  Mckenzy Salazar Mechele Collin, MD [place]  Pulmonary Critical Care 08/05/2023

## 2023-08-05 NOTE — Patient Instructions (Signed)
Dyspnea on exertion - Improving. multifactorial with deconditioning, severe COPD and hx chemotherapy/radiation Severe COPD (non-smoker) Hx of chemotherapy/radiation --CONTINUE Breztri TWO puffs TWICE a day --CONTINUE Albuterol TWO puffs AS NEEDED for shortness of breath or wheezing. Use before exercise --Previously completed pulm rehab 10/11/22 --Continue regular aerobic exercise at home

## 2023-08-16 ENCOUNTER — Other Ambulatory Visit: Payer: Self-pay | Admitting: Cardiovascular Disease

## 2023-08-16 ENCOUNTER — Encounter: Payer: Self-pay | Admitting: Pharmacist

## 2023-08-16 DIAGNOSIS — I700 Atherosclerosis of aorta: Secondary | ICD-10-CM

## 2023-08-16 DIAGNOSIS — E7800 Pure hypercholesterolemia, unspecified: Secondary | ICD-10-CM

## 2023-08-19 ENCOUNTER — Telehealth: Payer: Self-pay | Admitting: Pulmonary Disease

## 2023-08-19 NOTE — Telephone Encounter (Signed)
 PT's husband calling (DPR) or PT assistance on Breztri. States they pay a 50.00 co-pay. Please advise and fwd to the Triage team so they can advise PT.of their options. His [phone] or [phone]

## 2023-08-20 ENCOUNTER — Telehealth: Payer: Self-pay

## 2023-08-20 ENCOUNTER — Other Ambulatory Visit (HOSPITAL_COMMUNITY): Payer: Self-pay

## 2023-08-20 NOTE — Telephone Encounter (Signed)
 Pharmacy Patient Advocate Encounter  Asked to test claim for alternative due to co-pay. The following are results:   Test claim for Ruth White shows a $47.00 co-pay. If pharmacy is showing higher, it's possibly due to a deductible. Only other medication in same class, triple therapy, is Trelegy and it is also a $47.00

## 2023-08-20 NOTE — Telephone Encounter (Signed)
 Test claim shows a $47.00 co-pay. If pharmacy is showing higher, it's possibly due to a deductible. Only other medication in same class, triple therapy, is Trelegy and it is also a $47.00

## 2023-08-20 NOTE — Telephone Encounter (Signed)
 LM for PT to call us with these notes.

## 2023-08-24 ENCOUNTER — Other Ambulatory Visit (HOSPITAL_BASED_OUTPATIENT_CLINIC_OR_DEPARTMENT_OTHER): Payer: Self-pay | Admitting: Pulmonary Disease

## 2023-08-26 NOTE — Telephone Encounter (Signed)
 I called PT direct. She states has tried [employer], contracting. She also would like Dr. to call to recommend a affordable option. This and several other of her medications are currently unaffordable. Her # is [phone]

## 2023-08-27 NOTE — Telephone Encounter (Signed)
 Which would you recommend if both are $47.00

## 2023-08-27 NOTE — Telephone Encounter (Signed)
 Copay for both Triple therapy inhalers show $47.00/month at this time

## 2023-08-28 ENCOUNTER — Other Ambulatory Visit (HOSPITAL_COMMUNITY): Payer: Self-pay

## 2023-08-28 ENCOUNTER — Encounter (HOSPITAL_BASED_OUTPATIENT_CLINIC_OR_DEPARTMENT_OTHER): Payer: Self-pay

## 2023-08-28 NOTE — Telephone Encounter (Signed)
 Please let her know that I performed a pharmacy check of inhalers covered by her insurance and the only other inhaler that is cheaper than $47 is Generic Advair Diskus (aka Wixela) is $0.00. This would be a powdered inhaler and only has two ingredients versus the three in her triple inhaler choices.  If she wishes to order the Generic Advair Diskus is $0.00, please order the 250-50 mcg ONE puff in the morning and evening.

## 2023-08-28 NOTE — Telephone Encounter (Signed)
 I have left a message on their machine to have them return call. No answer at this time.  Also left detailed message on patients VM.

## 2023-09-12 ENCOUNTER — Encounter: Payer: Self-pay | Admitting: Pulmonary Disease

## 2023-09-12 NOTE — Telephone Encounter (Signed)
 Sending [facility] message and postal letter.

## 2023-09-14 ENCOUNTER — Other Ambulatory Visit: Payer: Self-pay | Admitting: Hematology and Oncology

## 2023-09-14 DIAGNOSIS — C50012 Malignant neoplasm of nipple and areola, left female breast: Secondary | ICD-10-CM

## 2023-09-16 ENCOUNTER — Encounter: Payer: Self-pay | Admitting: Oncology

## 2023-09-17 ENCOUNTER — Telehealth: Payer: Self-pay | Admitting: Oncology

## 2023-09-17 NOTE — Telephone Encounter (Signed)
 Contacted pt to R/S appt from 10/02/23 due to Provider PAL.   She wanted to know if she can delay her fu since she went to surgeon about 2 weeks ago for a fu and breast exam and that visit was good.   She also wanted to know, she will be due for Prolia in the ending of May but is currently still paying off the first shot she had received but is taking Vitamin D and Calcium, the shot itself Is over $3k and didn't know if there is something else she could take/use instead of the Prolia shot.

## 2023-09-27 ENCOUNTER — Telehealth (HOSPITAL_BASED_OUTPATIENT_CLINIC_OR_DEPARTMENT_OTHER): Payer: Self-pay

## 2023-09-27 ENCOUNTER — Encounter (HOSPITAL_BASED_OUTPATIENT_CLINIC_OR_DEPARTMENT_OTHER): Payer: Self-pay

## 2023-09-27 MED ORDER — FLUTICASONE-SALMETEROL 250-50 MCG/ACT IN AEPB: 1.0000 | INHALATION_SPRAY | Freq: Two times a day (BID) | RESPIRATORY_TRACT | 3 refills | Status: DC

## 2023-09-27 NOTE — Telephone Encounter (Signed)
 Rx sent to pharmacy    Copied from CRM [id number]. Topic: Clinical - Prescription Issue >> Sep 27, 2023  9:02 AM Gaetano Hawthorne wrote: Reason for CRM: Patient reached out regarding their missed call/mychart messages on the alternative inhaler option as Markus Daft is too expensive- they would like to opt for the prescription described: Generic Advair Diskus [place] Fant[place]) - Please see the message from the CMA on 08/28/2023. Patient would [facility]:  [id number] [place], [place] - [address] AT [employer] [address]  Phone: [phone] Fax: [phone]

## 2023-10-02 ENCOUNTER — Ambulatory Visit: Payer: PPO | Admitting: Oncology

## 2023-10-03 NOTE — Telephone Encounter (Signed)
 NFN

## 2023-11-25 ENCOUNTER — Other Ambulatory Visit: Payer: Self-pay | Admitting: Cardiovascular Disease

## 2023-11-25 DIAGNOSIS — I700 Atherosclerosis of aorta: Secondary | ICD-10-CM

## 2023-11-25 DIAGNOSIS — E7800 Pure hypercholesterolemia, unspecified: Secondary | ICD-10-CM

## 2023-11-27 ENCOUNTER — Telehealth: Payer: Self-pay | Admitting: Cardiovascular Disease

## 2023-11-27 NOTE — Telephone Encounter (Signed)
*  STAT* If patient is at the pharmacy, call can be transferred to refill team.   1. Which medications need to be refilled? (please list name of each medication and dose if known) Evolocumab  (REPATHA  SURECLICK) 140 MG/ML SOAJ    2. Would you like to learn more about the convenience, safety, & potential cost savings by using the [facility]?     3. Are you open to using the [facility] (Type [facility].  ).   4. Which pharmacy/location (including street and city if local pharmacy) is medication to be sent to? [id number] - [place], [place] - [address] [facility]    5. Do they need a 30 day or 90 day supply? 30 day

## 2023-12-03 IMAGING — DX DG CHEST 2V
2 series · 2 of 2 positions shown · non-contrast
Comparison: May 25, 2021

CLINICAL DATA: Shortness of breath

EXAM:
CHEST - 2 VIEW

[chest pa]
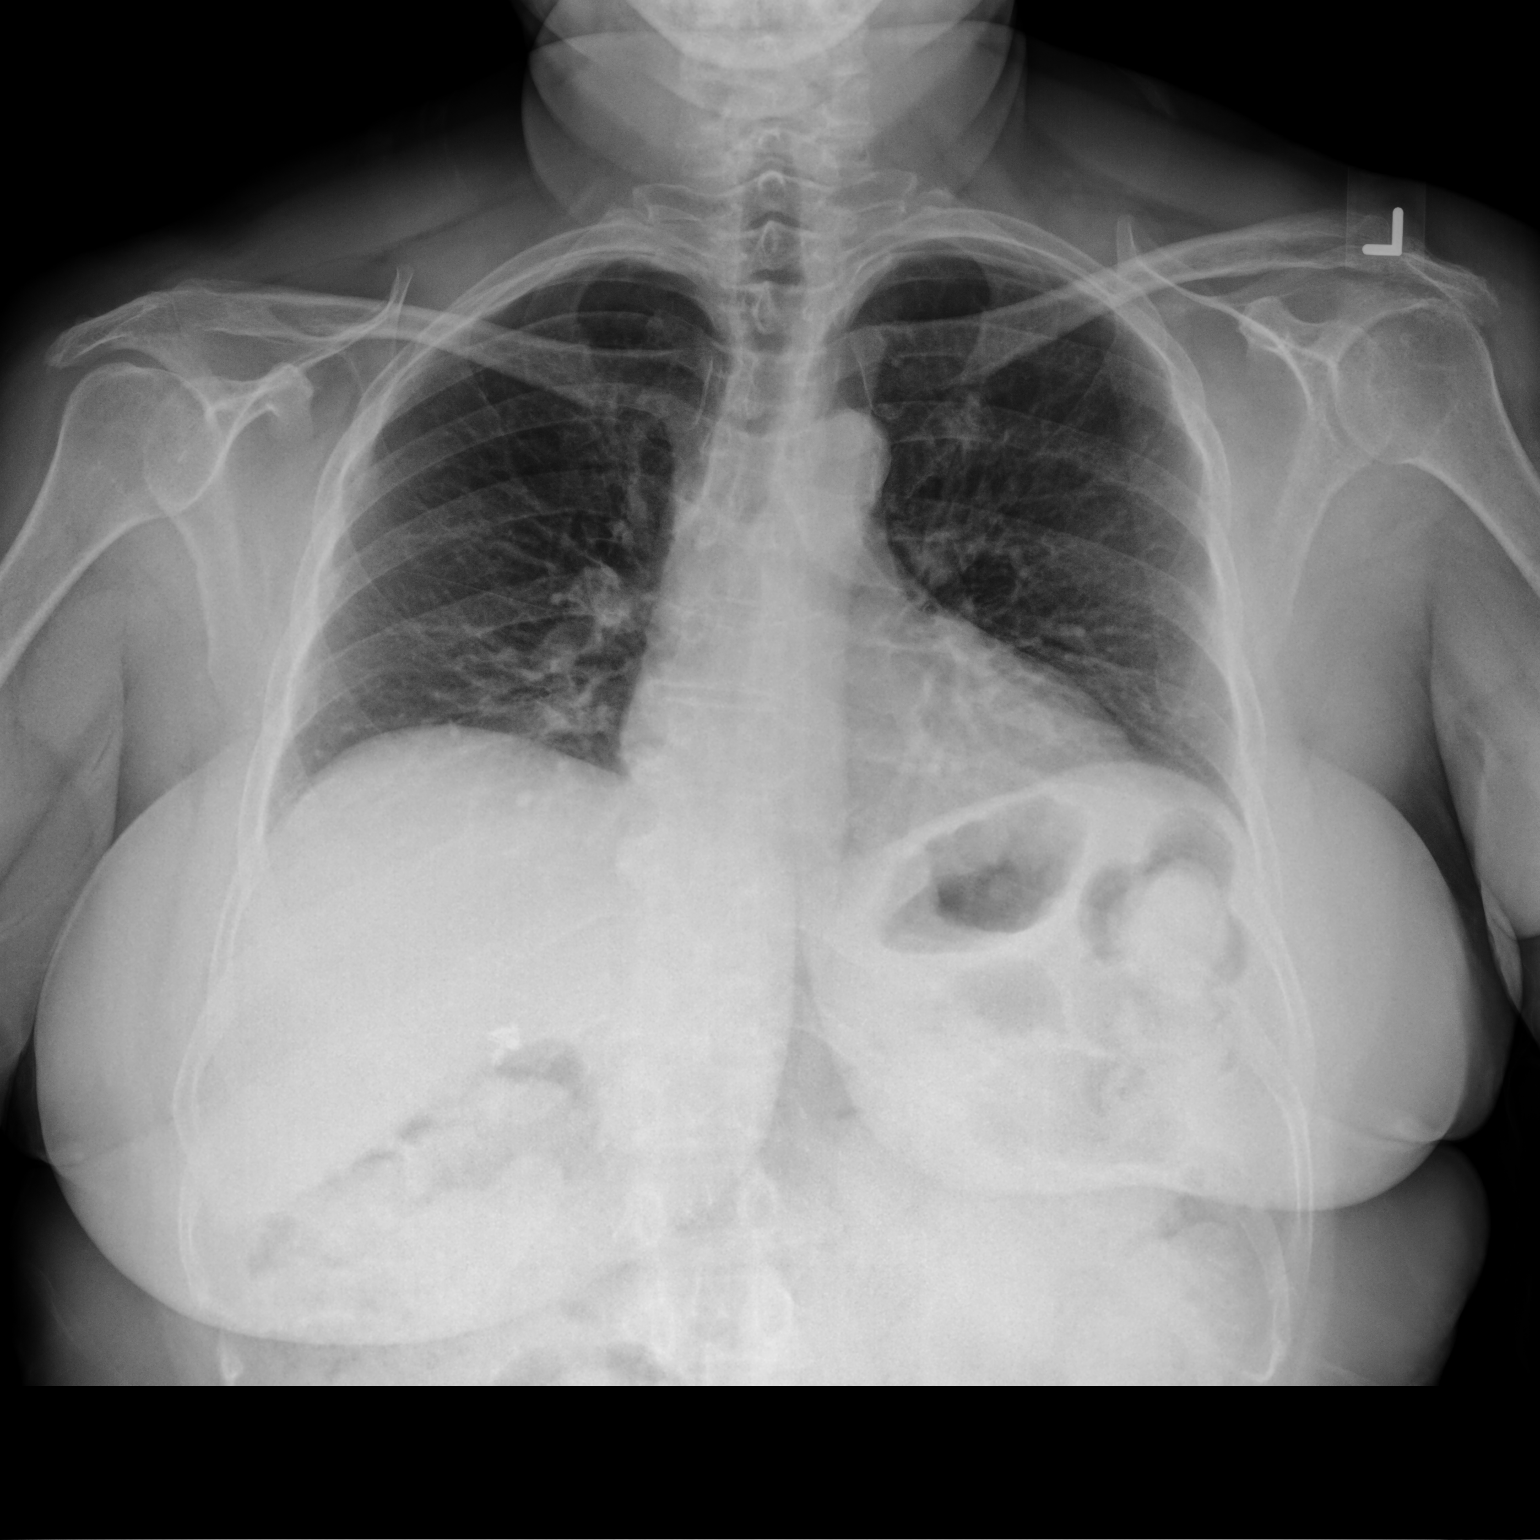

[chest lat]
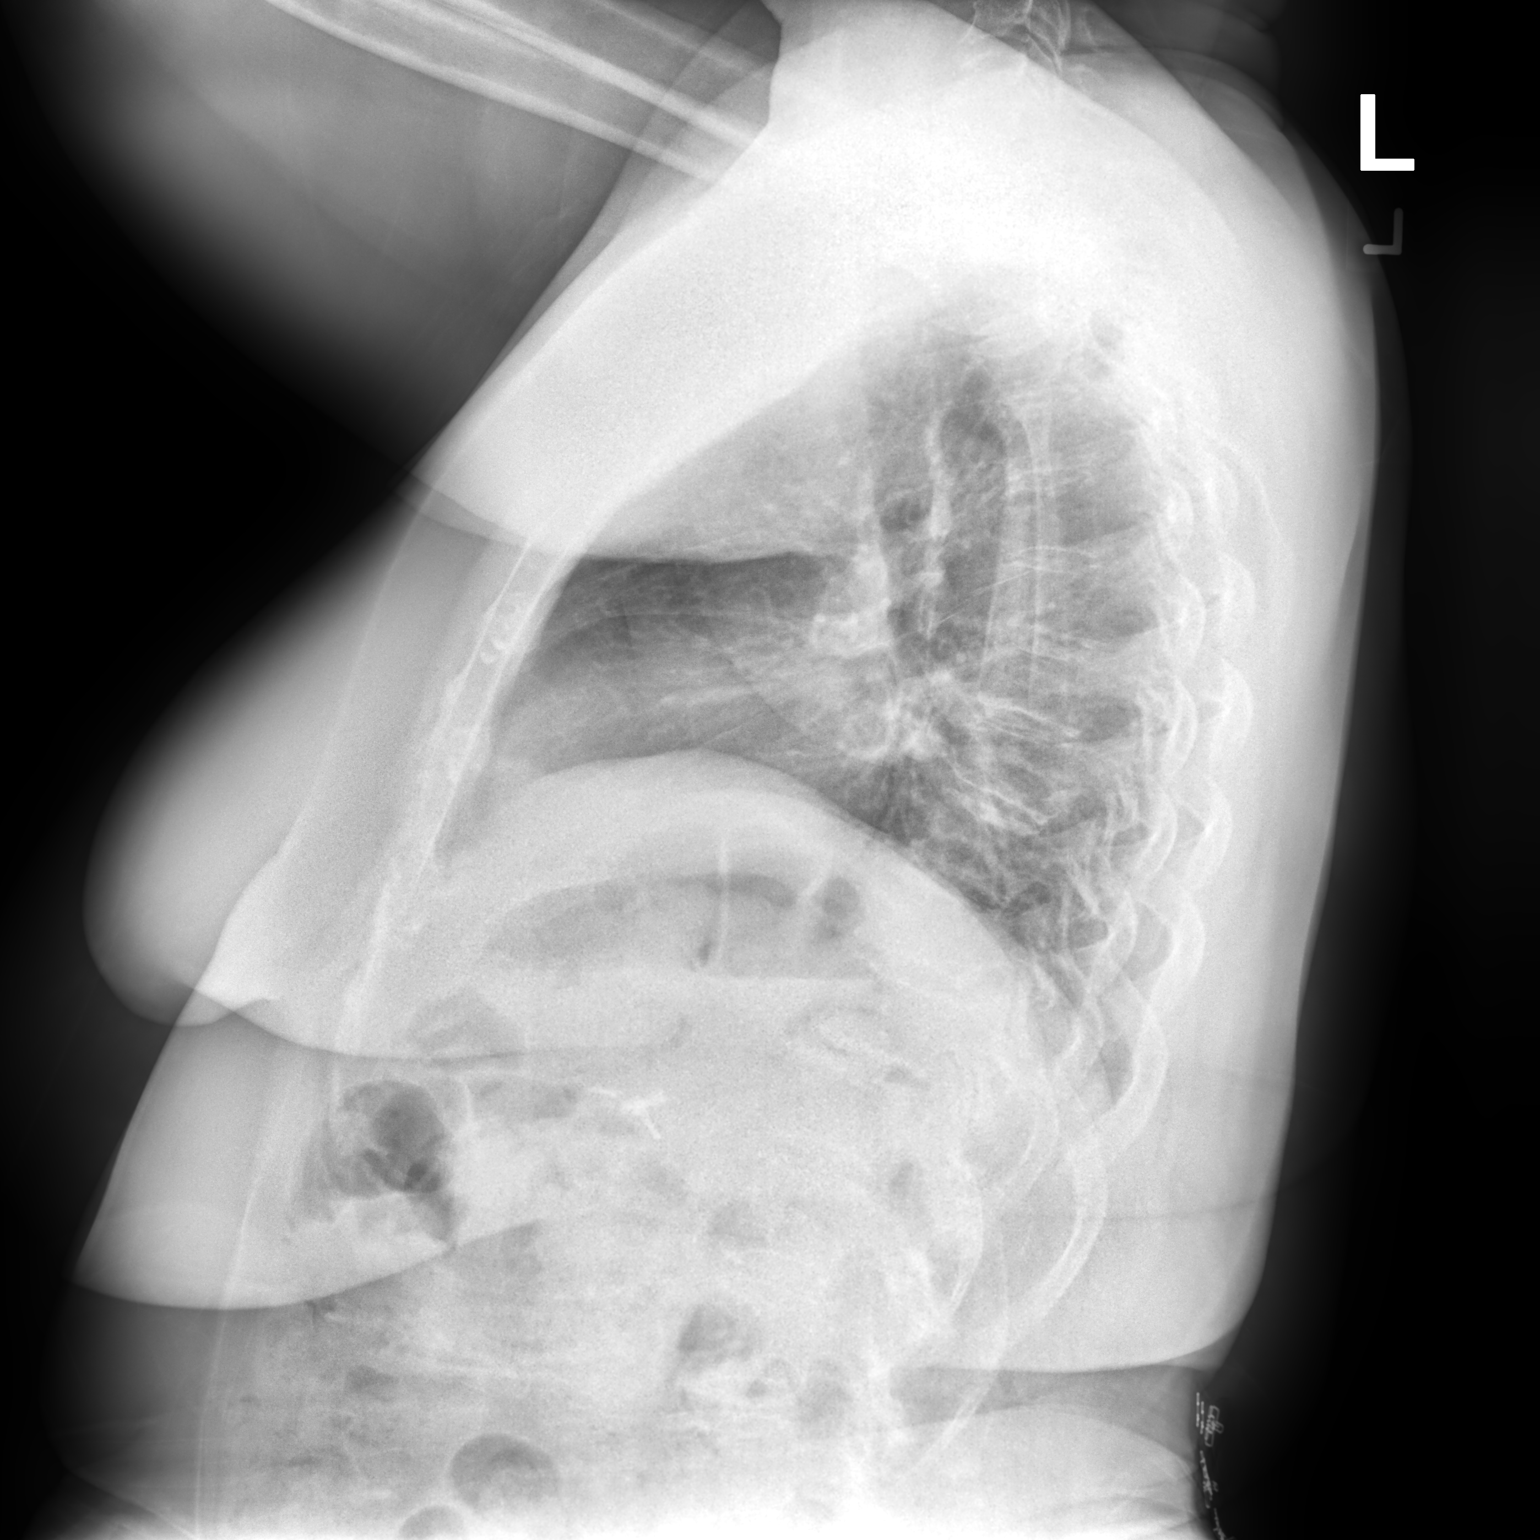

[2 of 2 positions shown; findings below may reference images not displayed]

FINDINGS: Low lung volumes limit evaluation. The heart, hila, and mediastinum
are normal. No pulmonary nodules or masses. No focal infiltrates. No
acute abnormalities are identified.
IMPRESSION: No active cardiopulmonary disease.

## 2023-12-13 ENCOUNTER — Ambulatory Visit: Attending: Nurse Practitioner | Admitting: Nurse Practitioner

## 2023-12-13 ENCOUNTER — Encounter: Payer: Self-pay | Admitting: Nurse Practitioner

## 2023-12-13 VITALS — BP 100/70 | HR 103 | Ht 64.0 in | Wt 142.0 lb

## 2023-12-13 DIAGNOSIS — R000 Tachycardia, unspecified: Secondary | ICD-10-CM

## 2023-12-13 DIAGNOSIS — I700 Atherosclerosis of aorta: Secondary | ICD-10-CM | POA: Diagnosis not present

## 2023-12-13 DIAGNOSIS — E119 Type 2 diabetes mellitus without complications: Secondary | ICD-10-CM

## 2023-12-13 DIAGNOSIS — E785 Hyperlipidemia, unspecified: Secondary | ICD-10-CM | POA: Diagnosis not present

## 2023-12-13 DIAGNOSIS — I10 Essential (primary) hypertension: Secondary | ICD-10-CM | POA: Diagnosis not present

## 2023-12-13 DIAGNOSIS — R0602 Shortness of breath: Secondary | ICD-10-CM

## 2023-12-13 DIAGNOSIS — C50912 Malignant neoplasm of unspecified site of left female breast: Secondary | ICD-10-CM

## 2023-12-13 DIAGNOSIS — E7800 Pure hypercholesterolemia, unspecified: Secondary | ICD-10-CM

## 2023-12-13 DIAGNOSIS — Z1731 Human epidermal growth factor receptor 2 positive status: Secondary | ICD-10-CM

## 2023-12-13 DIAGNOSIS — J449 Chronic obstructive pulmonary disease, unspecified: Secondary | ICD-10-CM

## 2023-12-13 DIAGNOSIS — J9611 Chronic respiratory failure with hypoxia: Secondary | ICD-10-CM

## 2023-12-13 MED ORDER — REPATHA SURECLICK 140 MG/ML ~~LOC~~ SOAJ: 1.0000 mL | SUBCUTANEOUS | 3 refills | Status: AC

## 2023-12-13 NOTE — Progress Notes (Unsigned)
 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, left (HCC)    Personal history of chemotherapy    Personal history of radiation therapy    Past Surgical History:  Procedure Laterality Date   BREAST LUMPECTOMY Left 2021    Allergies  Allergies  Allergen Reactions   Molnupiravir Nausea And Vomiting     Labs/Other Studies Reviewed    The following studies were reviewed today:  Cardiac Studies & Procedures   ______________________________________________________________________________________________   STRESS TESTS  MYOCARDIAL PERFUSION IMAGING 05/18/2021   ECHOCARDIOGRAM  ECHOCARDIOGRAM COMPLETE 07/14/2020  Narrative ECHOCARDIOGRAM REPORT    Patient Name:   Ruth White Date of Exam: 07/14/2020 Medical Rec #:  [id number]             Height:       63.0 in Accession #:    [id number]            Weight:       203.7 lb Date of Birth:  1960/08/27             BSA:          1.949 m Patient Age:    59 years              BP:           106/69 mmHg Patient Gender: F                     HR:           86 bpm. Exam Location:  Outpatient  Procedure: 2D Echo, Cardiac Doppler, Color Doppler and Strain  Analysis  Indications:    Chemo Z09  History:        Patient has no prior history of Echocardiogram examinations.  Sonographer:    Recardo Hedge RDCS Referring Phys: [id number] A MOS[facility]   Sonographer Comments: Image acquisition challenging due to patient body habitus. IMPRESSIONS   1. GLS -20.9%. 2. Left ventricular ejection fraction, by estimation, is 60 to 65%. The left ventricle has normal function. The left ventricle has no regional wall motion abnormalities. Left ventricular diastolic parameters were normal. 3. Right ventricular systolic function is normal. The right ventricular size is normal. 4. The mitral valve is normal in structure. No evidence of mitral valve regurgitation. No evidence of mitral stenosis. 5. The aortic valve is tricuspid. Aortic valve regurgitation is not visualized. No aortic stenosis is present. 6. The inferior vena cava is normal in size with greater than 50% respiratory variability, suggesting right atrial pressure of 3 mmHg.  FINDINGS Left Ventricle: Left ventricular ejection fraction, by estimation, is 60 to 65%. The left ventricle has normal function. The left ventricle has no regional wall motion abnormalities. The left ventricular internal cavity size was normal  in size. There is no left ventricular hypertrophy. Left ventricular diastolic parameters were normal.  Right Ventricle: The right ventricular size is normal. Right ventricular systolic function is normal.  Left Atrium: Left atrial size was normal in size.  Right Atrium: Right atrial size was normal in size.  Pericardium: There is no evidence of pericardial effusion.  Mitral Valve: The mitral valve is normal in structure. No evidence of mitral valve regurgitation. No evidence of mitral valve stenosis.  Tricuspid Valve: The tricuspid valve is normal in structure. Tricuspid valve regurgitation is not demonstrated. No evidence of tricuspid stenosis.  Aortic Valve: The aortic valve is  tricuspid. Aortic valve regurgitation is not visualized. No aortic stenosis is present.  Pulmonic Valve: The pulmonic valve was not well visualized. Pulmonic valve regurgitation is trivial. No evidence of pulmonic stenosis.  Aorta: The aortic root is normal in size and structure.  Venous: The inferior vena cava is normal in size with greater than 50% respiratory variability, suggesting right atrial pressure of 3 mmHg.  IAS/Shunts: No atrial level shunt detected by color flow Doppler.  Additional Comments: GLS -20.9%.   LEFT VENTRICLE PLAX 2D LVIDd:         3.90 cm      Diastology LVIDs:         3.00 cm      LV e' medial:    8.27 cm/s LV PW:         0.70 cm      LV E/e' medial:  8.0 LV IVS:        0.70 cm      LV e' lateral:   12.20 cm/s LVOT diam:     1.70 cm      LV E/e' lateral: 5.4 LV SV:         47 LV SV Index:   24 LVOT Area:     2.27 cm  LV Volumes (MOD) LV vol d, MOD A2C: 92.6 ml LV vol d, MOD A4C: 108.0 ml LV vol s, MOD A2C: 46.9 ml LV vol s, MOD A4C: 48.0 ml LV SV MOD A2C:     45.7 ml LV SV MOD A4C:     108.0 ml LV SV MOD BP:      54.1 ml  RIGHT VENTRICLE RV S prime:     11.40 cm/s TAPSE (M-mode): 1.9 cm  LEFT ATRIUM             Index       RIGHT ATRIUM           Index LA diam:        3.30 cm 1.69 cm/m  RA Area:     10.30 cm LA Vol (A2C):   25.4 ml 13.03 ml/m RA Volume:   22.40 ml  11.49 ml/m LA Vol (A4C):   28.0 ml 14.37 ml/m LA Biplane Vol: 27.0 ml 13.85 ml/m AORTIC VALVE LVOT Vmax:   101.00 cm/s LVOT Vmean:  70.400 cm/s LVOT VTI:    0.207 m  AORTA Ao Root diam: 2.60 cm  MITRAL VALVE MV Area (PHT): 3.99 cm    SHUNTS MV Decel Time: 190 msec    Systemic VTI:  0.21 m MV E velocity: 66.00 cm/s  Systemic Diam: 1.70 cm MV A velocity: 59.60 cm/s MV E/A ratio:  1.11  Redell Shallow MD Electronically signed by Redell Shallow MD Signature Date/Time: 07/14/2020/11:07:39 AM    Final           ______________________________________________________________________________________________  Recent Labs: No results found for requested labs within last 365 days.  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  Echocardiogram in 06/2020 showed EF 60 to 65%, normal LV function, no RWMA, normal RV systolic function, no significant valvular abnormalities.  She does have a history of aortic atherosclerosis noted on prior CT.  Myocardial perfusion study in 05/2021 was negative for ischemia.  She has a history of breast cancer.  Following treatment she experienced significant peripheral neuropathy, generalized fatigue, weight gain.  She was referred to cardiology for management of hyperlipidemia.  She was last seen in the office on 12/29/2021 and was stable from a cardiac standpoint.  She was started on Repatha .  She presents today for follow-up accompanied by her husband.  Since her last visit she has been stable from a cardiac standpoint.  She continues to note shortness of breath with activity. She feels that this has worsened slightly though she is no longer requiring home oxygen .  She continues to follow with pulmonology.  Despite her persistent shortness of breath, she walks regularly for exercise.  She reports mild orthostatic dizziness, BP has been running low.  She denies any presyncope or syncope.  Lisinopril was recently decreased per PCP.  HR is mildly elevated in office today.  She denies any chest pain, palpitations, edema, PND,  orthopnea, weight gain.  Home Medications    Current Outpatient Medications  Medication Sig Dispense Refill   ACCU-CHEK GUIDE test strip 1 (ONE) EACH DAILY AND AS NEEDED FOR SYMPTOMS     Accu-Chek Softclix Lancets lancets USE 1 LANCET DAILY AS DIRECTED     albuterol  (VENTOLIN  HFA) 108 (90 Base) MCG/ACT inhaler INHALE 1-2 PUFFS INTO THE LUNGS EVERY 4 HOURS AS NEEDED FOR WHEEZING OR SHORTNESS OF BREATH. 8.5 each 3   amitriptyline (ELAVIL) 25 MG tablet Take by mouth.     amitriptyline (ELAVIL) 25 MG tablet Take 25 mg by mouth at bedtime.     anastrozole  (ARIMIDEX ) 1 MG tablet TAKE 1 TABLET BY MOUTH EVERY DAY TO PREVENT FUTURE DISEASE RECURRENCE 90 tablet 3   aspirin 325 MG EC tablet Take 325 mg by mouth daily.     Blood Glucose Monitoring Suppl (ACCU-CHEK GUIDE ME) w/Device KIT 1 (ONE) KIT CHECK BLOOD GLUCOSE DAILY     BREZTRI  AEROSPHERE 160-9-4.8 MCG/ACT AERO Inhale 2 puffs into the lungs in the morning and at bedtime. 10.7 g 11   diclofenac Sodium (VOLTAREN) 1 % GEL Apply topically.     DULoxetine (CYMBALTA) 60 MG capsule Take 60 mg by mouth daily.     ezetimibe (ZETIA) 10 MG tablet Take 10 mg by mouth at bedtime.     FARXIGA 10 MG TABS tablet Take 10 mg by mouth every morning.     glucose blood (ACCU-CHEK GUIDE) test strip 1 (ONE) EACH DAILY AND AS NEEDED FOR SYMPTOMS     lactulose , encephalopathy, (CHRONULAC ) 10 GM/15ML SOLN Take 15 mLs (10 g total) by mouth daily. 473 mL 2   lisinopril (ZESTRIL) 20 MG tablet Take 20 mg by mouth 2 (two) times daily. (Patient taking differently: Take 20 mg by mouth daily.)  magic mouthwash SOLN Take 5 mLs by mouth.     metFORMIN (GLUCOPHAGE-XR) 500 MG 24 hr tablet Take 500 mg by mouth daily.     naloxone  (NARCAN ) nasal spray 4 mg/0.1 mL Use as directed for overdose of narcotic medication 1 each 2   ondansetron  (ZOFRAN ) 4 MG tablet Take 1 tablet (4 mg total) by mouth every 8 (eight) hours as needed for nausea or vomiting. 30 tablet 1   rosuvastatin  (CRESTOR) 40 MG tablet Take 40 mg by mouth daily.     tirzepatide  (MOUNJARO ) 5 MG/0.5ML Pen Inject 5 mg into the skin once a week. 2 mL 2   traMADol (ULTRAM) 50 MG tablet Take 50 mg by mouth 4 (four) times daily.     atenolol (TENORMIN) 50 MG tablet Take 50 mg by mouth daily. (Patient not taking: Reported on 12/13/2023)     cyclobenzaprine (FLEXERIL) 10 MG tablet Take 1 tablet by mouth 2 (two) times daily as needed. (Patient not taking: Reported on 12/13/2023)     Evolocumab  (REPATHA  SURECLICK) 140 MG/ML SOAJ Inject 140 mg into the skin every 14 (fourteen) days. 6 mL 3   fluticasone -salmeterol (WIXELA INHUB) 250-50 MCG/ACT AEPB Inhale 1 puff into the lungs in the morning and at bedtime. (Patient not taking: Reported on 12/13/2023) 60 each 3   ibuprofen (ADVIL) 800 MG tablet Take 800 mg by mouth every 8 (eight) hours as needed. (Patient not taking: Reported on 12/13/2023)     NUCYNTA 50 MG tablet Take 50 mg by mouth 4 (four) times daily. (Patient not taking: Reported on 12/13/2023)     prochlorperazine  (COMPAZINE ) 10 MG tablet Take 1 tablet (10 mg total) by mouth every 6 (six) hours as needed for nausea or vomiting. (Patient not taking: Reported on 12/13/2023) 30 tablet 1   sertraline (ZOLOFT) 50 MG tablet Take 1 tablet by mouth daily.     No current facility-administered medications for this visit.     Review of Systems    She denies chest pain, palpitations, pnd, orthopnea, n, v, dizziness, syncope, edema, weight gain, or early satiety. All other systems reviewed and are otherwise negative except as noted above.   Physical Exam    VS:  BP 100/70 (BP Location: Left Arm, Patient Position: Sitting, Cuff Size: Normal)   Pulse (!) 103   Ht 5' 4 (1.626 m)   Wt 142 lb (64.4 kg)   LMP 08/12/2020 (LMP Unknown)   SpO2 94%   BMI 24.37 kg/m  GEN: Well nourished, well developed, in no acute distress. HEENT: normal. Neck: Supple, no JVD, carotid bruits, or masses. Cardiac: RRR, no murmurs, rubs, or  gallops. No clubbing, cyanosis, edema.  Radials/DP/PT 2+ and equal bilaterally.  Respiratory:  Respirations regular and unlabored, clear to auscultation bilaterally. GI: Soft, nontender, nondistended, BS + x 4. MS: no deformity or atrophy. Skin: warm and dry, no rash. Neuro:  Strength and sensation are intact. Psych: Normal affect.  Accessory Clinical Findings    ECG personally reviewed by me today - EKG Interpretation Date/Time:  Friday December 13 2023 08:29:49 EDT Ventricular Rate:  105 PR Interval:  142 QRS Duration:  74 QT Interval:  328 QTC Calculation: 433 R Axis:   53  Text Interpretation: Sinus tachycardia with occasional Premature ventricular complexes No previous ECGs available Confirmed by Daneen Perkins ([id number]) on 12/13/2023 8:40:02 AM  - no acute changes.   Lab Results  Component Value Date   WBC 3.8 (L) 11/05/2022   HGB 13.4 11/05/2022  HCT 42.4 11/05/2022   MCV 86.9 11/05/2022   PLT 195 11/05/2022   Lab Results  Component Value Date   CREATININE 0.79 11/05/2022   BUN 15 11/05/2022   NA 139 11/05/2022   K 4.0 11/05/2022   CL 107 11/05/2022   CO2 23 11/05/2022   Lab Results  Component Value Date   ALT 49 (H) 11/05/2022   AST 42 (H) 11/05/2022   ALKPHOS 32 (L) 11/05/2022   BILITOT 0.6 11/05/2022   No results found for: CHOL, HDL, LDLCALC, LDLDIRECT, TRIG, CHOLHDL  No results found for: HGBA1C  Assessment & Plan    1. Hyperlipidemia: LDL was 71 in 04/2023.  She recently had dry labs drawn per PCP, will request copy.  Refill of Repatha  provided in office today.  Continue Repatha , Crestor, Zetia.  2. Aortic atherosclerosis: Noted on prior CT. She reports stable chronic dyspnea, she denies chest pain. Repeat echo pending as below.  Denies symptoms concerning for angina. Continue aspirin, Repatha , Crestor, Zetia.  3. Hypertension: She reports mild orthostatic dizziness, BP has been running low.  She denies any presyncope or syncope.  Lisinopril  was recently decreased per PCP.  For now, continue to monitor, continue current antihypertensive regimen.   4. History of TIA: Continue aspirin, Repatha , Crestor, Zetia.  5. Type 2 diabetes: No recent A1c on file.  Monitored and managed per PCP.  6. COPD/chronic hypoxic respiratory failure/dyspnea on exertion: Echo in 06/2020 showed EF 60 to 65%, normal LV function, no RWMA, normal RV systolic function, no significant valvular abnormalities. Myocardial perfusion study in 05/2021 was negative for ischemia. She continues to note shortness of breath with activity. She feels that this has worsened slightly, though she is no longer requiring home oxygen .  She continues to follow with pulmonology.  Despite her persistent shortness of breath, she walks regularly for exercise.  Euvolemic and well compensated on exam. Will update echo given reports of worsening dyspnea.  7. History of breast cancer: S/p lumpectomy.  Follows with oncology.  8. Disposition:Follow-up in 1 year, sooner if needed.  Damien JAYSON Braver, NP 12/15/2023, 12:19 PM

## 2023-12-13 NOTE — Patient Instructions (Signed)
 Medication Instructions:  Your physician recommends that you continue on your current medications as directed. Please refer to the Current Medication list given to you today.  *If you need a refill on your cardiac medications before your next appointment, please call your pharmacy*  Lab Work: NONE ordered at this time of appointment   Testing/Procedures: Your physician has requested that you have an echocardiogram. Echocardiography is a painless test that uses sound waves to create images of your heart. It provides your doctor with information about the size and shape of your heart and how well your heart's chambers and valves are working. This procedure takes approximately one hour. There are no restrictions for this procedure. Please do NOT wear cologne, perfume, aftershave, or lotions (deodorant is allowed). Please arrive 15 minutes prior to your appointment time.  Please note: We ask at that you not bring children with you during ultrasound (echo/ vascular) testing. Due to room size and safety concerns, children are not allowed in the ultrasound rooms during exams. Our front office staff cannot provide observation of children in our lobby area while testing is being conducted. An adult accompanying a patient to their appointment will only be allowed in the ultrasound room at the discretion of the ultrasound technician under special circumstances. We apologize for any inconvenience.   Follow-Up: At [facility], you and your health needs are our priority.  As part of our continuing mission to provide you with exceptional heart care, our providers are all part of one team.  This team includes your primary Cardiologist (physician) and Advanced Practice Providers or APPs (Physician Assistants and Nurse Practitioners) who all work together to provide you with the care you need, when you need it.  Your next appointment:   1 year(s)  Provider:   Jerel Balding, MD    We recommend  signing up for the patient portal called MyChart.  Sign up information is provided on this After Visit Summary.  MyChart is used to connect with patients for Virtual Visits (Telemedicine).  Patients are able to view lab/test results, encounter notes, upcoming appointments, etc.  Non-urgent messages can be sent to your provider as well.   To learn more about what you can do with MyChart, go to [email or web address].

## 2023-12-15 ENCOUNTER — Encounter: Payer: Self-pay | Admitting: Nurse Practitioner

## 2024-01-07 IMAGING — MG DIGITAL DIAGNOSTIC BILAT W/ TOMO W/ CAD
6 of 9 series · 6 of 25 positions shown · non-contrast
Comparison: Previous exam(s).

CLINICAL DATA: History of left breast cancer status post lumpectomy
in [id number].

EXAM:
DIGITAL DIAGNOSTIC BILATERAL MAMMOGRAM WITH TOMOSYNTHESIS AND CAD
TECHNIQUE: Bilateral digital diagnostic mammography and breast tomosynthesis
was performed. The images were evaluated with computer-aided
detection.

[L CC]
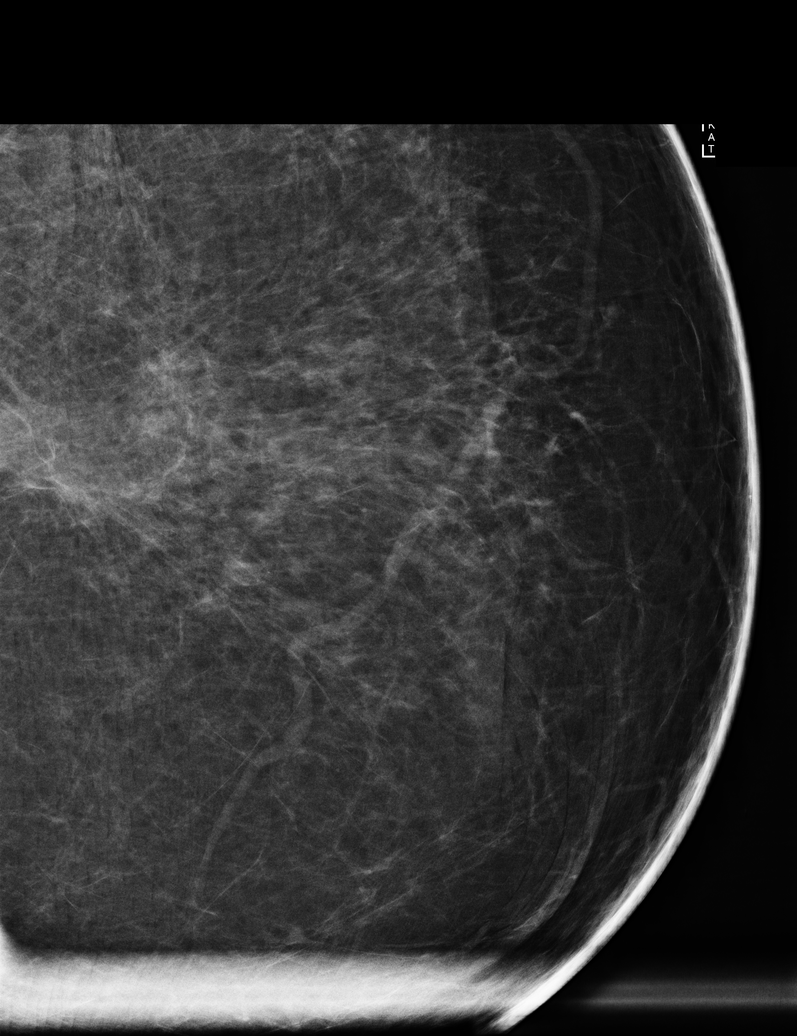

[L CC synth-2D]
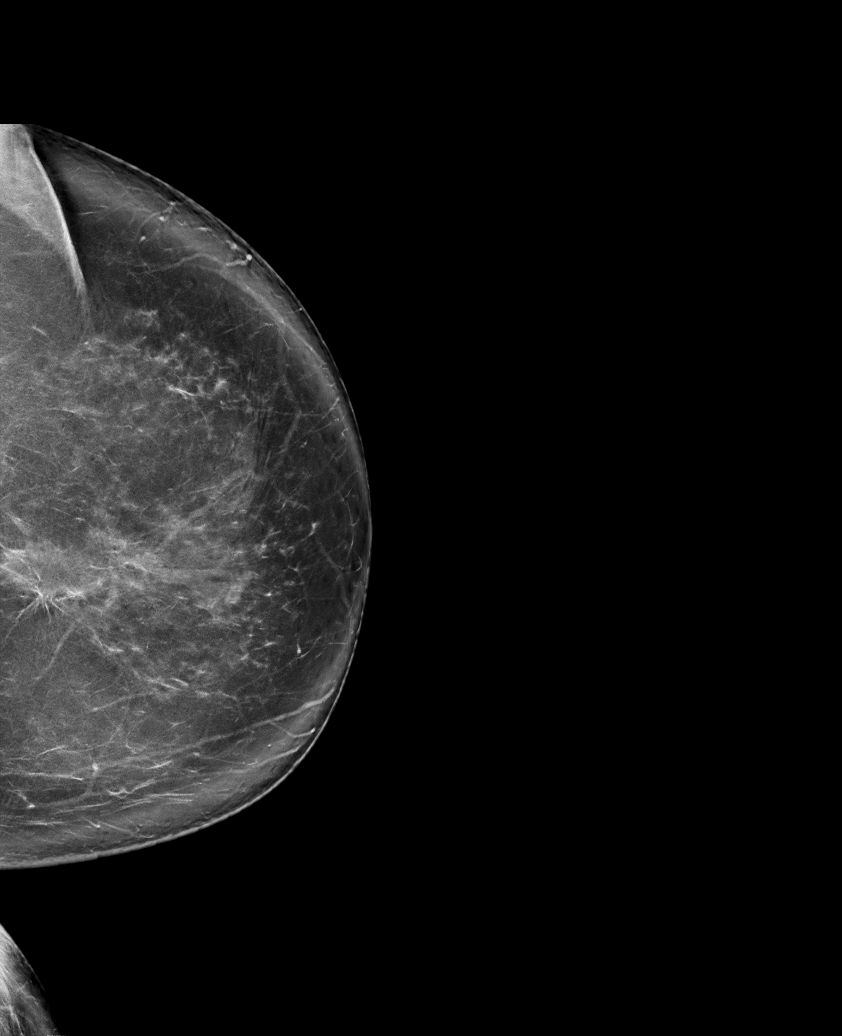

[R MLO synth-2D]
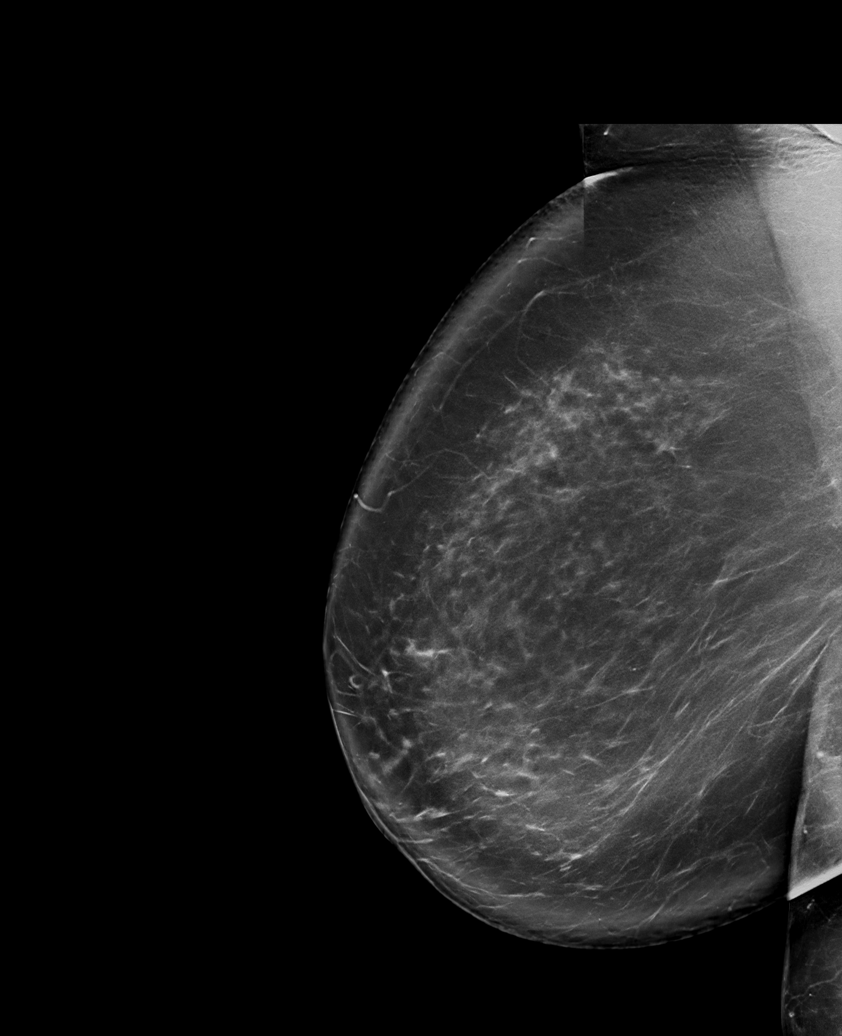

[R CC synth-2D]
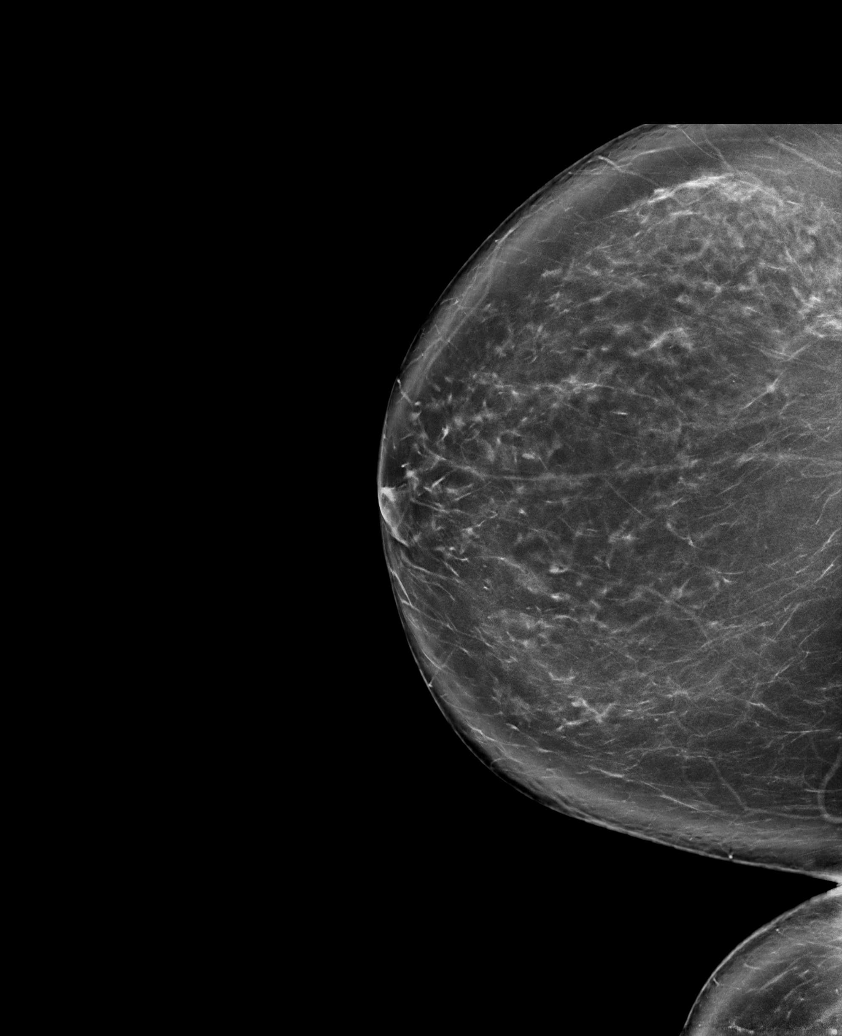

[L MLO synth-2D]
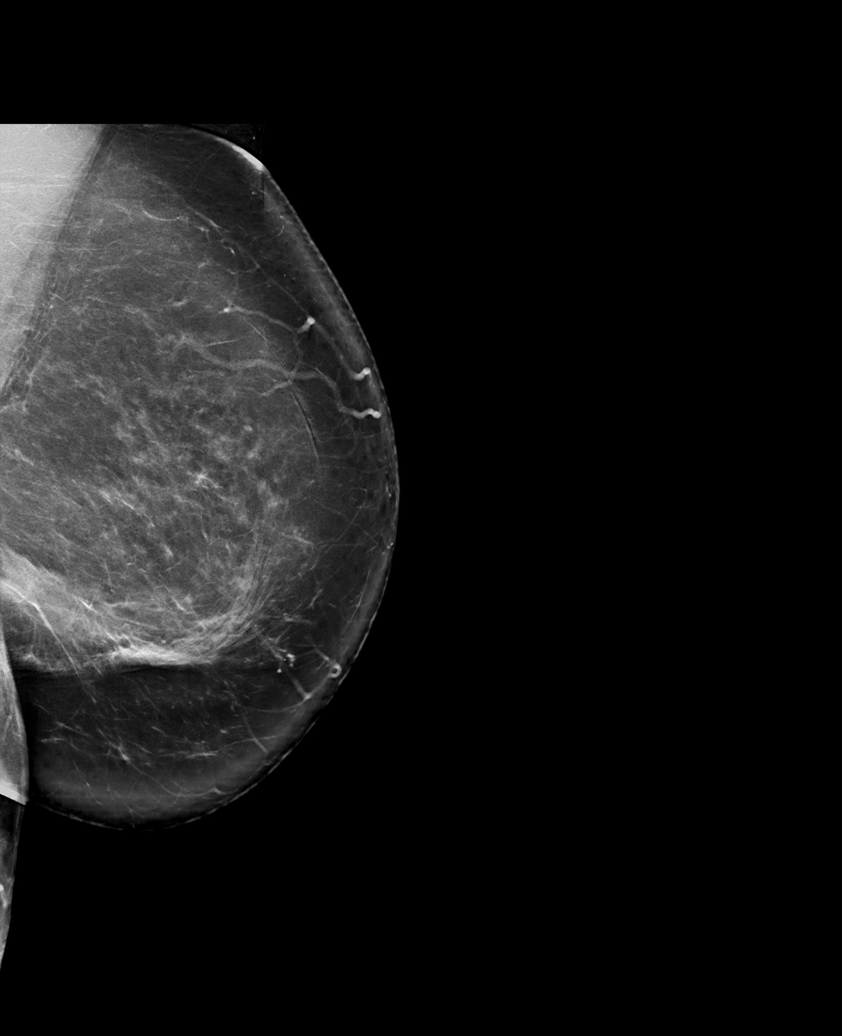

[L MLO tomo · tomo slice 52/103.0]
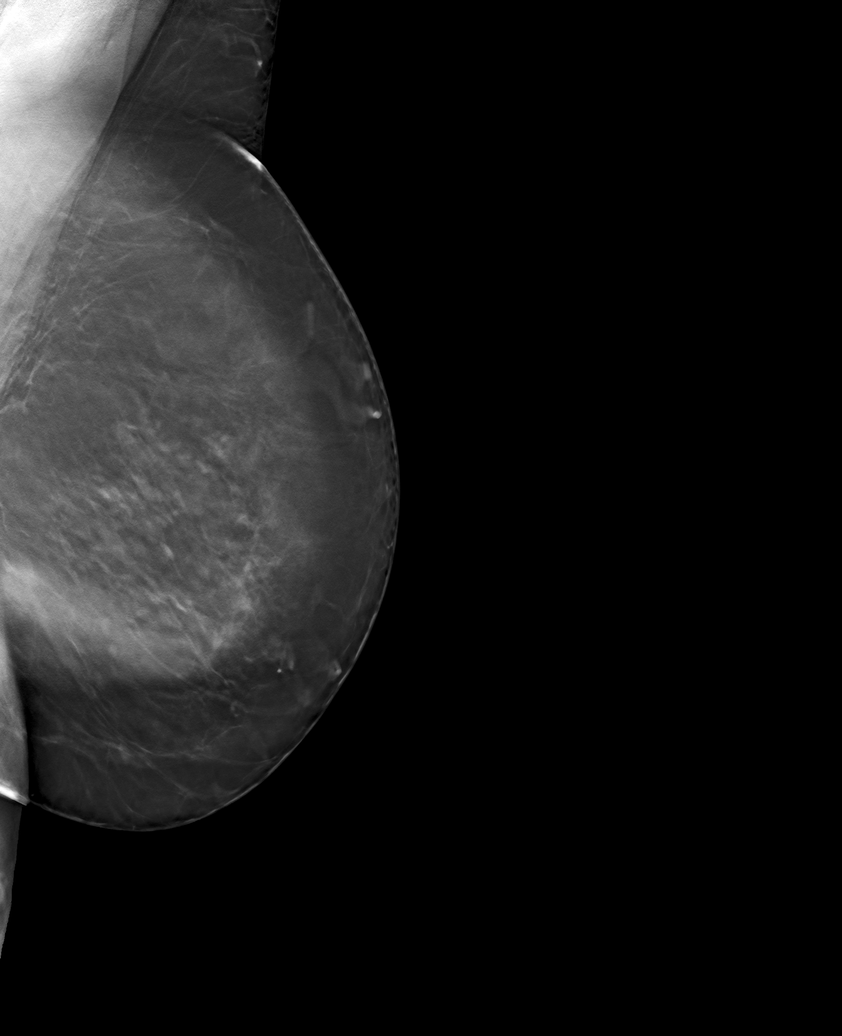

[6 of 25 positions shown; findings below may reference images not displayed]

ACR Breast Density Category b: There are scattered areas of
fibroglandular density.
FINDINGS: Stable lumpectomy changes are seen in the left breast. No suspicious
mass or malignant type microcalcifications identified in either
breast.
IMPRESSION: No evidence of malignancy in either breast.

RECOMMENDATION:
Bilateral screening mammogram in 1 year is recommended.

I have discussed the findings and recommendations with the patient.
If applicable, a reminder letter will be sent to the patient
regarding the next appointment.

BI-RADS CATEGORY  2: Benign.

## 2024-01-20 ENCOUNTER — Other Ambulatory Visit (HOSPITAL_BASED_OUTPATIENT_CLINIC_OR_DEPARTMENT_OTHER): Payer: Self-pay | Admitting: Pulmonary Disease

## 2024-01-27 ENCOUNTER — Ambulatory Visit (HOSPITAL_BASED_OUTPATIENT_CLINIC_OR_DEPARTMENT_OTHER): Payer: Self-pay | Admitting: Family

## 2024-01-27 ENCOUNTER — Ambulatory Visit (HOSPITAL_COMMUNITY)
Admission: RE | Admit: 2024-01-27 | Discharge: 2024-01-27 | Disposition: A | Discharge status: Home / Self Care | Source: Ambulatory Visit | Attending: Nurse Practitioner | Admitting: Nurse Practitioner

## 2024-01-27 DIAGNOSIS — I10 Essential (primary) hypertension: Secondary | ICD-10-CM | POA: Diagnosis not present

## 2024-01-27 DIAGNOSIS — R000 Tachycardia, unspecified: Secondary | ICD-10-CM | POA: Insufficient documentation

## 2024-01-27 DIAGNOSIS — R0602 Shortness of breath: Secondary | ICD-10-CM | POA: Diagnosis not present

## 2024-01-27 LAB — ECHOCARDIOGRAM COMPLETE
Area-P 1/2: 5.23 cm2
S' Lateral: 2.5 cm

## 2024-02-15 ENCOUNTER — Other Ambulatory Visit (HOSPITAL_BASED_OUTPATIENT_CLINIC_OR_DEPARTMENT_OTHER): Payer: Self-pay | Admitting: Pulmonary Disease

## 2024-04-23 ENCOUNTER — Ambulatory Visit (HOSPITAL_BASED_OUTPATIENT_CLINIC_OR_DEPARTMENT_OTHER): Admitting: Pulmonary Disease

## 2024-04-23 ENCOUNTER — Encounter (HOSPITAL_BASED_OUTPATIENT_CLINIC_OR_DEPARTMENT_OTHER): Payer: Self-pay | Admitting: Pulmonary Disease

## 2024-04-23 VITALS — BP 118/87 | HR 93 | Ht 64.0 in | Wt 136.2 lb

## 2024-04-23 DIAGNOSIS — J4489 Other specified chronic obstructive pulmonary disease: Secondary | ICD-10-CM

## 2024-04-23 DIAGNOSIS — R29898 Other symptoms and signs involving the musculoskeletal system: Secondary | ICD-10-CM | POA: Diagnosis not present

## 2024-04-23 DIAGNOSIS — J4551 Severe persistent asthma with (acute) exacerbation: Secondary | ICD-10-CM

## 2024-04-23 MED ORDER — PREDNISONE 10 MG PO TABS: ORAL_TABLET | ORAL | 0 refills | Status: AC

## 2024-04-23 NOTE — Progress Notes (Signed)
 Subjective:   PATIENT ID: Ruth White GENDER: female DOB: June 26, 1960, MRN: [id number]   HPI  Chief Complaint  Patient presents with   COPD    Follow up copd   Reason for Visit: Follow-up  [name] is a 63 year old female never smoker with history of multifocal stage Ia breast cancer status post lumpectomy 07/2019 and radiation on adjuvant therapy, peripheral neuropathy, depression who presents for follow-up  Initial Consult 07/10/21 She is referred by her PCP. Note reviewed from 06/14/21. She reports shortness of breath after completing chemotherapy in August/September. Associated with coughing. Occasional wheezing. No chest congestion. Occurs with any exertion including with walking, bathing and talking. Bending down is difficult for her due to dizziness. She had a negative CXR in the fall and normal SpO2. She has tried on Trelegy for one week but unable to take due to feeling choked up. She was switched to Breztri  and has been compliant for the last month. She reports partial improvement. Before her cancer treatment two years ago she is extremely active and functional.  08/22/21 She presented for PFT evaluation. Since our last visit she has been taking Breztri  with partial improvement ~30%. Her shortness of breath and coughing has improved. Occasional wheezing. She is walking two laps around the yard. Husband is present and reports she is more active.  11/27/21 Husband present and provides additional history with patient. She is compliant with Breztri  twice daily. She takes albuterol  once a day. She continues to shortness of breath. Occasional deep wheeze and cough with exertion. Not currently in Pulmonary Rehab due to frequent doctor visits and fatigue. She was recently hospitalized [facility] (5/4-10/24/21) for colitis requiring antibiotics (amoxicillin). Denies needing vancomycin. She walking around with the walker at the house daily but has low energy at times.    01/12/22 Husband present visit and provides additional history.  Since our last visit she has had shortness of breath. Trying to be more active walking 4000-5000 steps a day. Has some wheezing and coughing. Compliant with Breztri . She will use her albuterol  3-4 times a day, usually when she isn't wearing oxygen . She reports on room air that her pulse oximetry will read as low as 91%.  She was previously referred to pulmonary rehab but states that she is unable to make it to classes 3 days a week due to transportation issues.  03/20/22 Since our last visit she has been compliant on Breztri . She has started on Pulmonary Rehab and wears 2L. She is starting to walk more. Still feels short of breath when rushed. Still has some wheezing and coughing. Uses albuterol  2-3 times a day with activity. Has some symptoms at night. No exacerbations since our last visit  07/27/22 Since our last visit she has participated in Pulmonary rehab but has had medical hold for January due to her husbands cancer. She is ready to get to re-enroll. Does feel like they will benefit. Does have chronic cough and some wheezing with deep breaths but shortness of breath has improved.  11/02/22 Since our last visit she has completed Pulmonary rehab. She walks 1 mile for days out of the week. She does weights and bands at home. Has shortness of breath with activity. Continues to have chronic cough with upper throat congestion. Uses albuterol  at least twice a day  08/05/23 Since our last visit she has seen her PCP she has seen for URI in November and that resolved. However did see PCP again for productive cough with yellow  sputum, wheezing and shortness of breath 10 days ago and received Augmentin. Has improved 60% and slowly clearing up. Compliant Breztri  twice a day. Does not wear oxygen  at night. Has been walking around the house. Recently completed physical therapy in the last four weeks.   05/03/24 Since our last visit she has been  on Breztri . Previously on Wixela for two months until she reached her deductible and then returned to Breztri . Feels better controlled with Breztri . She reports worsening phlegm R>L in her throat requiring albuterol . Has been exercising daily. Denies fevers, chills. Has been trying tea, lemon and honey. Maybe occasional wheezing. Triggered by dust.  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, left (HCC)    Malignant neoplasm of nipple or areola of female breast, left (HCC)    Personal history of chemotherapy    Personal history of radiation therapy      Allergies  Allergen Reactions   Molnupiravir Nausea And Vomiting     Outpatient Medications Prior to Visit  Medication Sig Dispense Refill   ACCU-CHEK GUIDE test strip 1 (ONE) EACH DAILY AND AS NEEDED FOR SYMPTOMS     Accu-Chek Softclix Lancets lancets USE 1 LANCET DAILY AS DIRECTED     albuterol  (VENTOLIN  HFA) 108 (90 Base) MCG/ACT inhaler INHALE 1-2 PUFFS INTO THE LUNGS EVERY 4 HOURS AS NEEDED FOR WHEEZING OR SHORTNESS OF BREATH. 8.5 each 3   amitriptyline (ELAVIL) 25 MG tablet Take by mouth.     anastrozole  (ARIMIDEX ) 1 MG tablet TAKE 1 TABLET BY MOUTH EVERY DAY TO PREVENT FUTURE DISEASE RECURRENCE 90 tablet 3   aspirin 325 MG EC tablet Take 325 mg by mouth daily.     Blood Glucose Monitoring Suppl (ACCU-CHEK GUIDE ME) w/Device KIT 1 (ONE) KIT CHECK BLOOD GLUCOSE DAILY     BREZTRI  AEROSPHERE 160-9-4.8 MCG/ACT AERO Inhale 2 puffs into the lungs in the morning and at bedtime. 10.7 g 11   diclofenac Sodium (VOLTAREN) 1 % GEL Apply topically.     Evolocumab  (REPATHA  SURECLICK) 140 MG/ML SOAJ Inject 140 mg into the skin every 14 (fourteen) days. 6 mL 3   ezetimibe (ZETIA) 10  MG tablet Take 10 mg by mouth at bedtime.     FARXIGA 10 MG TABS tablet Take 10 mg by mouth every morning.     fluticasone  (FLONASE) 50 MCG/ACT nasal spray SMARTSIG:1 Spray(s) Both Nares Twice Daily PRN     glucose blood (ACCU-CHEK GUIDE) test strip 1 (ONE) EACH DAILY AND AS NEEDED FOR SYMPTOMS     ketoconazole (NIZORAL) 2 % cream Apply 1 Application topically 2 (two) times daily.     lactulose  (CHRONULAC ) 10 GM/15ML solution SMARTSIG:45 Milliliter(s) By Mouth 1-4 Times Daily     lactulose , encephalopathy, (CHRONULAC ) 10 GM/15ML SOLN Take 15 mLs (10 g total) by mouth daily. 473 mL 2   lubiprostone (AMITIZA) 24 MCG capsule Take 24 mcg by mouth every 12 (twelve) hours.     magic mouthwash SOLN Take 5 mLs by mouth.     metFORMIN (GLUCOPHAGE-XR) 500 MG 24 hr tablet Take 500 mg by mouth daily.     naloxone  (NARCAN ) nasal spray 4 mg/0.1 mL Use as directed for overdose of narcotic medication 1 each 2   ondansetron  (ZOFRAN ) 4 MG  tablet Take 1 tablet (4 mg total) by mouth every 8 (eight) hours as needed for nausea or vomiting. 30 tablet 1   rosuvastatin (CRESTOR) 40 MG tablet Take 40 mg by mouth daily.     sertraline (ZOLOFT) 50 MG tablet Take 1 tablet by mouth daily.     tirzepatide  (MOUNJARO ) 5 MG/0.5ML Pen Inject 5 mg into the skin once a week. 2 mL 2   traMADol (ULTRAM) 50 MG tablet Take 50 mg by mouth 4 (four) times daily.     atenolol (TENORMIN) 50 MG tablet Take 50 mg by mouth daily. (Patient not taking: Reported on 04/23/2024)     lisinopril (ZESTRIL) 20 MG tablet Take 20 mg by mouth 2 (two) times daily. (Patient not taking: Reported on 04/23/2024)     amitriptyline (ELAVIL) 25 MG tablet Take 25 mg by mouth at bedtime.     cyclobenzaprine (FLEXERIL) 10 MG tablet Take 1 tablet by mouth 2 (two) times daily as needed. (Patient not taking: Reported on 12/13/2023)     ibuprofen (ADVIL) 800 MG tablet Take 800 mg by mouth every 8 (eight) hours as needed. (Patient not taking: Reported on 12/13/2023)      NUCYNTA 50 MG tablet Take 50 mg by mouth 4 (four) times daily. (Patient not taking: Reported on 12/13/2023)     prochlorperazine  (COMPAZINE ) 10 MG tablet Take 1 tablet (10 mg total) by mouth every 6 (six) hours as needed for nausea or vomiting. (Patient not taking: Reported on 12/13/2023) 30 tablet 1   WIXELA INHUB 250-50 MCG/ACT AEPB INHALE 1 PUFF INTO THE LUNGS IN THE MORNING AND AT BEDTIME. RINSE MOUTH AFTER USE. (Patient not taking: Reported on 04/23/2024) 60 each 3   No facility-administered medications prior to visit.    Review of Systems  Constitutional:  Negative for chills, diaphoresis, fever, malaise/fatigue and weight loss.  HENT:  Negative for congestion.   Respiratory:  Positive for cough, sputum production and wheezing. Negative for hemoptysis and shortness of breath.   Cardiovascular:  Negative for chest pain, palpitations and leg swelling.     Objective:   Vitals:   04/23/24 0825  BP: 118/87  Pulse: 93  TempSrc: Oral  SpO2: 98%  Weight: 136 lb 3.2 oz (61.8 kg)  Height: 5' 4 (1.626 m)     SpO2: 98 %  Physical Exam: General: Well-appearing, no acute distress HENT: [place], AT Eyes: EOMI, no scleral icterus Respiratory: Clear to auscultation bilaterally.  No crackles, wheezing or rales Cardiovascular: RRR, -M/R/G, no JVD Extremities:-Edema,-tenderness Neuro: AAO x4, CNII-XII grossly intact Psych: Normal mood, normal affect   Data Reviewed:  Imaging: CXR 07/10/2021- No infiltrate, effusion or edema. Low lung volumes. CT chest 12/18/2021-overall normal lung parenchyma  PFT: 08/22/21  FVC 1.05 (39%) FEV1 0.67 (31%) Ratio 67   Unable to complete TLC or DLCO due to effort Interpretation: Very severe obstructive defect. No significant bronchodilator response  Labs: CBC    Component Value Date/Time   WBC 3.8 (L) 11/05/2022 0857   RBC 4.88 11/05/2022 0857   HGB 13.4 11/05/2022 0857   HCT 42.4 11/05/2022 0857   PLT 195 11/05/2022 0857   MCV 86.9 11/05/2022 0857    MCV 79 (A) 07/05/2020 0000   MCH 27.5 11/05/2022 0857   MCHC 31.6 11/05/2022 0857   RDW 14.0 11/05/2022 0857   LYMPHSABS 1.0 11/05/2022 0857   MONOABS 0.4 11/05/2022 0857   EOSABS 0.0 11/05/2022 0857   BASOSABS 0.0 11/05/2022 0857      Latest Ref  Rng & Units 11/05/2022    8:57 AM 07/05/2020   12:00 AM 03/18/2020   12:00 AM  CMP  Glucose 70 - 99 mg/dL 888     BUN 6 - 20 mg/dL 15  26     16       Creatinine 0.44 - 1.00 mg/dL 9.20  1.5     1.0      Sodium 135 - 145 mmol/L 139  139     140      Potassium 3.5 - 5.1 mmol/L 4.0  4.2     4.1      Chloride 98 - 111 mmol/L 107  105     107      CO2 22 - 32 mmol/L 23  25       Calcium 8.9 - 10.3 mg/dL 9.1  9.7     9.6      Total Protein 6.5 - 8.1 g/dL 7.0     Total Bilirubin 0.3 - 1.2 mg/dL 0.6     Alkaline Phos 38 - 126 U/L 32  57     49      AST 15 - 41 U/L 42  37     31      ALT 0 - 44 U/L 49  27     26         This result is from an external source.   Labs 05/10/23 reviewed with stable AST 49, ALT 39 normalized    Assessment & Plan:   Discussion: 63 year old female with hx of multifocal stage Ia breast cancer s/p lumpectomy 07/2021 and radiation on adjuvant therapy, peripheral neuropathy and depression who presents for follow-up. Dyspnea multifactorial with deconditioning, severe COPD and hx chemotherapy and radiation. Financial limitations with triple therapy. On Wixela briefly and now back on Breztri . Currently in mild exacerbation. Discussed clinical course and management of COPD/asthma including bronchodilator regimen, preventive care including vaccinations and action plan for exacerbation.  Assessment & Plan Asthma-COPD overlap syndrome (HCC) Never smoker. Previously well controlled on Breztri . Currently have URI --CONTINUE Breztri  TWO puffs TWICE a day --CONTINUE Albuterol  TWO puffs AS NEEDED for shortness of breath or wheezing. Use before exercise Severe persistent asthma with acute exacerbation (HCC) --Prednisone taper  ordered --Continue over-the-counter meds, herbal teas, cough drops Muscular deconditioning --CONTINUE aerobic exercise 30 min 3-5 days a week --Previously completed pulm rehab 10/11/22  Chronic hypoxemic respiratory failure - resolved --No oxygen  needs since 11/2022  Health Maintenance Immunization History  Administered Date(s) Administered   Influenza-Unspecified 03/18/2024   Unspecified SARS-COV-2 Vaccination 03/22/2024   CT Lung Screen - never smoker. Not qualified.  No orders of the defined types were placed in this encounter.  Meds ordered this encounter  Medications   predniSONE (DELTASONE) 10 MG tablet    Sig: Take 4 tablets (40 mg total) by mouth daily with breakfast for 2 days, THEN 3 tablets (30 mg total) daily with breakfast for 2 days, THEN 2 tablets (20 mg total) daily with breakfast for 2 days, THEN 1 tablet (10 mg total) daily with breakfast for 2 days.    Dispense:  20 tablet    Refill:  0    Return in about 4 months (around 08/21/2024).    Paisyn Guercio Slater Staff, MD [facility] 04/23/2024

## 2024-04-23 NOTE — Patient Instructions (Signed)
 Asthma-COPD overlap syndrome (HCC) Never smoker. Previously well controlled on Breztri . Currently have URI --CONTINUE Breztri  TWO puffs TWICE a day --CONTINUE Albuterol  TWO puffs AS NEEDED for shortness of breath or wheezing. Use before exercise  Severe persistent asthma with acute exacerbation (HCC) --Prednisone taper ordered --Continue over-the-counter meds, herbal teas, cough drops  Muscular deconditioning --CONTINUE aerobic exercise 30 min 3-5 days a week --Previously completed pulm rehab 10/11/22

## 2024-05-21 ENCOUNTER — Other Ambulatory Visit (HOSPITAL_BASED_OUTPATIENT_CLINIC_OR_DEPARTMENT_OTHER): Payer: Self-pay | Admitting: Pulmonary Disease

## 2024-06-26 ENCOUNTER — Other Ambulatory Visit (HOSPITAL_BASED_OUTPATIENT_CLINIC_OR_DEPARTMENT_OTHER): Payer: Self-pay | Admitting: Family Medicine

## 2024-06-26 DIAGNOSIS — R748 Abnormal levels of other serum enzymes: Secondary | ICD-10-CM

## 2024-07-03 ENCOUNTER — Ambulatory Visit (HOSPITAL_BASED_OUTPATIENT_CLINIC_OR_DEPARTMENT_OTHER)
Admission: RE | Admit: 2024-07-03 | Discharge: 2024-07-03 | Disposition: A | Discharge status: Home / Self Care | Source: Ambulatory Visit | Attending: Family Medicine | Admitting: Family Medicine

## 2024-07-03 DIAGNOSIS — R748 Abnormal levels of other serum enzymes: Secondary | ICD-10-CM

## 2024-07-24 ENCOUNTER — Telehealth: Payer: Self-pay | Admitting: Pharmacy Technician

## 2024-07-24 NOTE — Telephone Encounter (Signed)
" ° °  Pharmacy Patient Advocate Encounter   Received notification from [facility] KEY that prior authorization for REPATHA  is required/requested.   Insurance verification completed.   The patient is insured through [facility] [employer].   Per test claim: PA required; PA submitted to above mentioned insurance via Latent Key/confirmation #/EOC [id number] Status is pending  "

## 2024-07-24 NOTE — Telephone Encounter (Signed)
 Pharmacy Patient Advocate Encounter  Received notification from [employer] that Prior Authorization for repatha  has been APPROVED from 07/24/24 to 07/24/25   PA #/Case ID/Reference #: [id number]

## 2024-08-18 ENCOUNTER — Ambulatory Visit (HOSPITAL_BASED_OUTPATIENT_CLINIC_OR_DEPARTMENT_OTHER): Admitting: Pulmonary Disease

## 2024-08-24 ENCOUNTER — Inpatient Hospital Stay: Admitting: Oncology
